# Patient Record
Sex: Female | Born: 1955 | Race: Black or African American | Hispanic: No | Smoking: Former smoker
Health system: Southern US, Community
[De-identification: ages and names within clinical notes are randomized; demographics above are authoritative.]

## PROBLEM LIST (undated history)

## (undated) DIAGNOSIS — J45909 Unspecified asthma, uncomplicated: Secondary | ICD-10-CM

## (undated) DIAGNOSIS — Z923 Personal history of irradiation: Secondary | ICD-10-CM

## (undated) DIAGNOSIS — K219 Gastro-esophageal reflux disease without esophagitis: Secondary | ICD-10-CM

## (undated) DIAGNOSIS — N390 Urinary tract infection, site not specified: Secondary | ICD-10-CM

## (undated) DIAGNOSIS — J302 Other seasonal allergic rhinitis: Secondary | ICD-10-CM

## (undated) DIAGNOSIS — K5190 Ulcerative colitis, unspecified, without complications: Secondary | ICD-10-CM

## (undated) DIAGNOSIS — C50912 Malignant neoplasm of unspecified site of left female breast: Secondary | ICD-10-CM

## (undated) DIAGNOSIS — Z1612 Extended spectrum beta lactamase (ESBL) resistance: Secondary | ICD-10-CM

## (undated) DIAGNOSIS — M2550 Pain in unspecified joint: Secondary | ICD-10-CM

## (undated) DIAGNOSIS — Z9289 Personal history of other medical treatment: Secondary | ICD-10-CM

## (undated) DIAGNOSIS — Z973 Presence of spectacles and contact lenses: Secondary | ICD-10-CM

## (undated) DIAGNOSIS — E669 Obesity, unspecified: Secondary | ICD-10-CM

## (undated) DIAGNOSIS — E559 Vitamin D deficiency, unspecified: Secondary | ICD-10-CM

## (undated) DIAGNOSIS — G8929 Other chronic pain: Secondary | ICD-10-CM

## (undated) DIAGNOSIS — Z87891 Personal history of nicotine dependence: Secondary | ICD-10-CM

## (undated) DIAGNOSIS — Z8249 Family history of ischemic heart disease and other diseases of the circulatory system: Secondary | ICD-10-CM

## (undated) DIAGNOSIS — R519 Headache, unspecified: Secondary | ICD-10-CM

## (undated) DIAGNOSIS — Z90710 Acquired absence of both cervix and uterus: Secondary | ICD-10-CM

## (undated) DIAGNOSIS — B9629 Other Escherichia coli [E. coli] as the cause of diseases classified elsewhere: Secondary | ICD-10-CM

## (undated) DIAGNOSIS — R51 Headache: Secondary | ICD-10-CM

## (undated) DIAGNOSIS — Z86010 Personal history of colonic polyps: Secondary | ICD-10-CM

## (undated) DIAGNOSIS — Q676 Pectus excavatum: Secondary | ICD-10-CM

## (undated) DIAGNOSIS — T7840XA Allergy, unspecified, initial encounter: Secondary | ICD-10-CM

## (undated) DIAGNOSIS — E611 Iron deficiency: Secondary | ICD-10-CM

## (undated) DIAGNOSIS — D649 Anemia, unspecified: Secondary | ICD-10-CM

## (undated) HISTORY — DX: Extended spectrum beta lactamase (ESBL) resistance: Z16.12

## (undated) HISTORY — DX: Personal history of other medical treatment: Z92.89

## (undated) HISTORY — DX: Pain in unspecified joint: M25.50

## (undated) HISTORY — DX: Family history of ischemic heart disease and other diseases of the circulatory system: Z82.49

## (undated) HISTORY — DX: Urinary tract infection, site not specified: N39.0

## (undated) HISTORY — DX: Headache, unspecified: R51.9

## (undated) HISTORY — DX: Ulcerative colitis, unspecified, without complications: K51.90

## (undated) HISTORY — DX: Other seasonal allergic rhinitis: J30.2

## (undated) HISTORY — PX: WISDOM TOOTH EXTRACTION: SHX21

## (undated) HISTORY — DX: Other Escherichia coli (E. coli) as the cause of diseases classified elsewhere: B96.29

## (undated) HISTORY — DX: Personal history of colonic polyps: Z86.010

## (undated) HISTORY — DX: Headache: R51

## (undated) HISTORY — PX: SKIN BIOPSY: SHX1

## (undated) HISTORY — PX: POLYPECTOMY: SHX149

## (undated) HISTORY — DX: Pectus excavatum: Q67.6

## (undated) HISTORY — DX: Acquired absence of both cervix and uterus: Z90.710

## (undated) HISTORY — DX: Presence of spectacles and contact lenses: Z97.3

## (undated) HISTORY — DX: Iron deficiency: E61.1

## (undated) HISTORY — DX: Obesity, unspecified: E66.9

## (undated) HISTORY — PX: TONSILLECTOMY: SUR1361

## (undated) HISTORY — DX: Personal history of nicotine dependence: Z87.891

## (undated) HISTORY — DX: Anemia, unspecified: D64.9

## (undated) HISTORY — DX: Malignant neoplasm of unspecified site of left female breast: C50.912

## (undated) HISTORY — DX: Vitamin D deficiency, unspecified: E55.9

## (undated) HISTORY — DX: Other chronic pain: G89.29

## (undated) HISTORY — DX: Gastro-esophageal reflux disease without esophagitis: K21.9

## (undated) HISTORY — DX: Allergy, unspecified, initial encounter: T78.40XA

## (undated) HISTORY — PX: OTHER SURGICAL HISTORY: SHX169

---

## 1997-09-24 ENCOUNTER — Ambulatory Visit (HOSPITAL_COMMUNITY): Admission: RE | Admit: 1997-09-24 | Discharge: 1997-09-24 | Payer: Self-pay | Admitting: Obstetrics and Gynecology

## 1997-11-23 ENCOUNTER — Other Ambulatory Visit: Admission: RE | Admit: 1997-11-23 | Discharge: 1997-11-23 | Payer: Self-pay | Admitting: Obstetrics and Gynecology

## 1999-10-22 ENCOUNTER — Other Ambulatory Visit: Admission: RE | Admit: 1999-10-22 | Discharge: 1999-10-22 | Payer: Self-pay | Admitting: Obstetrics and Gynecology

## 2000-06-15 HISTORY — PX: TOTAL ABDOMINAL HYSTERECTOMY: SHX209

## 2001-04-14 ENCOUNTER — Inpatient Hospital Stay (HOSPITAL_COMMUNITY): Admission: RE | Admit: 2001-04-14 | Discharge: 2001-04-17 | Payer: Self-pay | Admitting: Obstetrics and Gynecology

## 2003-08-03 ENCOUNTER — Other Ambulatory Visit: Admission: RE | Admit: 2003-08-03 | Discharge: 2003-08-03 | Payer: Self-pay | Admitting: Obstetrics and Gynecology

## 2005-01-09 ENCOUNTER — Other Ambulatory Visit: Admission: RE | Admit: 2005-01-09 | Discharge: 2005-01-09 | Payer: Self-pay | Admitting: Obstetrics and Gynecology

## 2007-06-16 DIAGNOSIS — Z9289 Personal history of other medical treatment: Secondary | ICD-10-CM

## 2007-06-16 HISTORY — DX: Personal history of other medical treatment: Z92.89

## 2011-02-20 ENCOUNTER — Other Ambulatory Visit: Payer: Self-pay | Admitting: Podiatry

## 2011-02-20 DIAGNOSIS — M25572 Pain in left ankle and joints of left foot: Secondary | ICD-10-CM

## 2011-02-24 ENCOUNTER — Other Ambulatory Visit: Payer: Self-pay

## 2011-03-04 ENCOUNTER — Ambulatory Visit
Admission: RE | Admit: 2011-03-04 | Discharge: 2011-03-04 | Disposition: A | Discharge status: Home / Self Care | Payer: 59 | Source: Ambulatory Visit | Attending: Podiatry | Admitting: Podiatry

## 2011-03-04 DIAGNOSIS — M25572 Pain in left ankle and joints of left foot: Secondary | ICD-10-CM

## 2011-06-16 DIAGNOSIS — N390 Urinary tract infection, site not specified: Secondary | ICD-10-CM

## 2011-06-16 HISTORY — PX: FOOT TENDON SURGERY: SHX958

## 2011-06-16 HISTORY — DX: Urinary tract infection, site not specified: N39.0

## 2012-11-21 ENCOUNTER — Emergency Department (INDEPENDENT_AMBULATORY_CARE_PROVIDER_SITE_OTHER)
Admission: EM | Admit: 2012-11-21 | Discharge: 2012-11-21 | Disposition: A | Discharge status: Home / Self Care | Payer: 59 | Source: Home / Self Care | Attending: Emergency Medicine | Admitting: Emergency Medicine

## 2012-11-21 ENCOUNTER — Encounter (HOSPITAL_COMMUNITY): Payer: Self-pay | Admitting: Emergency Medicine

## 2012-11-21 DIAGNOSIS — N390 Urinary tract infection, site not specified: Secondary | ICD-10-CM

## 2012-11-21 HISTORY — DX: Unspecified asthma, uncomplicated: J45.909

## 2012-11-21 LAB — POCT URINALYSIS DIP (DEVICE)
Glucose, UA: NEGATIVE mg/dL
Ketones, ur: NEGATIVE mg/dL
Protein, ur: 100 mg/dL — AB
Specific Gravity, Urine: >=1.03 (ref 1.005–1.030)
Urobilinogen, UA: 0.2 mg/dL (ref 0.0–1.0)

## 2012-11-21 MED ORDER — PHENAZOPYRIDINE HCL 200 MG PO TABS: 200.0000 mg | ORAL_TABLET | Freq: Three times a day (TID) | ORAL | Status: DC

## 2012-11-21 MED ORDER — SULFAMETHOXAZOLE-TRIMETHOPRIM 800-160 MG PO TABS: 1.0000 | ORAL_TABLET | Freq: Two times a day (BID) | ORAL | Status: DC

## 2012-11-21 NOTE — ED Provider Notes (Signed)
Medical screening examination/treatment/procedure(s) were performed by non-physician practitioner and as supervising physician I was immediately available for consultation/collaboration.  Burnett Kanaris, MD 11/21/12 [phone]

## 2012-11-21 NOTE — ED Notes (Signed)
Pt c/o poss UTI onset Wednesday sxs include: urgency, frequency, dysuria Denies: abd pain, fevers, vag d/c Drinking Cranberry Juice for sxs w/no relief. She is alert and oriented w/no signs of acute distress.

## 2012-11-21 NOTE — ED Provider Notes (Signed)
History     CSN: [id number]  Arrival date & time 11/21/12  1010   First MD Initiated Contact with Patient 11/21/12 1047      Chief Complaint  Patient presents with  . Urinary Tract Infection    (Consider location/radiation/quality/duration/timing/severity/associated sxs/prior treatment) HPI Comments: Pt presents c.o urinary frequency, urgency, and burning at the end of her stream since Wednesday 5 days ago.  She has been trying to drink cranberry juice for this but it keeps getting worse.  She has had some mild bilateral flank pain as well.  Denies abdominal pain, fever, chills, NVD, or gross hematuria.  No recent hospitalizations or urinary catheters   Patient is a 57 y.o. female presenting with urinary tract infection.  Urinary Tract Infection Pertinent negatives include no chest pain, no abdominal pain and no shortness of breath.    Past Medical History  Diagnosis Date  . Asthma     History reviewed. No pertinent past surgical history.  No family history on file.  History  Substance Use Topics  . Smoking status: Never Smoker   . Smokeless tobacco: Not on file  . Alcohol Use: No    OB History   Grav Para Term Preterm Abortions TAB SAB Ect Mult Living                  Review of Systems  Constitutional: Negative for fever and chills.  Eyes: Negative for visual disturbance.  Respiratory: Negative for cough and shortness of breath.   Cardiovascular: Negative for chest pain, palpitations and leg swelling.  Gastrointestinal: Negative for nausea, vomiting and abdominal pain.  Endocrine: Negative for polydipsia and polyuria.  Genitourinary: Positive for dysuria, urgency, frequency and flank pain. Negative for hematuria, vaginal bleeding, vaginal discharge, difficulty urinating and pelvic pain.  Musculoskeletal: Negative for myalgias and arthralgias.  Skin: Negative for rash.  Neurological: Negative for dizziness, weakness and light-headedness.    Allergies  Review  of patient's allergies indicates no known allergies.  Home Medications   Current Outpatient Rx  Name  Route  Sig  Dispense  Refill  . phenazopyridine (PYRIDIUM) 200 MG tablet   Oral   Take 1 tablet (200 mg total) by mouth 3 (three) times daily.   6 tablet   0   . sulfamethoxazole-trimethoprim (SEPTRA DS) 800-160 MG per tablet   Oral   Take 1 tablet by mouth every 12 (twelve) hours.   10 tablet   0     BP 140/99  Pulse 87  Temp(Src) 98.2 F (36.8 C) (Oral)  Resp 18  SpO2 99%  Physical Exam  Nursing note and vitals reviewed. Constitutional: She is oriented to person, place, and time. Vital signs are normal. She appears well-developed and well-nourished. No distress.  HENT:  Head: Atraumatic.  Eyes: EOM are normal. Pupils are equal, round, and reactive to light.  Cardiovascular: Normal rate, regular rhythm and normal heart sounds.  Exam reveals no gallop and no friction rub.   No murmur heard. Pulmonary/Chest: Effort normal and breath sounds normal. No respiratory distress. She has no wheezes. She has no rales.  Abdominal: Soft. There is no tenderness. There is no CVA tenderness.  Neurological: She is alert and oriented to person, place, and time. She has normal strength.  Skin: Skin is warm and dry. She is not diaphoretic.  Psychiatric: She has a normal mood and affect. Her behavior is normal. Judgment normal.    ED Course  Procedures (including critical care time)  Labs Reviewed  POCT URINALYSIS DIP (DEVICE) - Abnormal; Notable for the following:    Hgb urine dipstick MODERATE (*)    Protein, ur 100 (*)    Nitrite POSITIVE (*)    Leukocytes, UA MODERATE (*)    All other components within normal limits  URINE CULTURE   No results found.   1. UTI (lower urinary tract infection)       MDM  Will treat as simple UTI.  Pt will f/u if she develops any fever, worsening flank pain, NVD, abdominal pain.    Meds ordered this encounter  Medications  .  sulfamethoxazole-trimethoprim (SEPTRA DS) 800-160 MG per tablet    Sig: Take 1 tablet by mouth every 12 (twelve) hours.    Dispense:  10 tablet    Refill:  0  . phenazopyridine (PYRIDIUM) 200 MG tablet    Sig: Take 1 tablet (200 mg total) by mouth 3 (three) times daily.    Dispense:  6 tablet    Refill:  0           Liam Graham, PA-C 11/21/12 1125

## 2012-11-23 ENCOUNTER — Telehealth (HOSPITAL_COMMUNITY): Payer: Self-pay | Admitting: *Deleted

## 2012-11-23 LAB — URINE CULTURE

## 2012-11-23 MED ORDER — NITROFURANTOIN MONOHYD MACRO 100 MG PO CAPS: 100.0000 mg | ORAL_CAPSULE | Freq: Two times a day (BID) | ORAL | Status: DC

## 2012-11-23 NOTE — ED Notes (Signed)
Urine culture: >100,000 colonies E. Coli.  Pt. treated with Septra DS and it is resistant.  Order obtained from [facility] for [employer].  I called pt.  Pt. verified x 2 and given results.  Pt. told to stop the Septra and take all of Macrobid with plenty of water. Pt. wants Rx. called to [facility] on [address].  Rx. called to pharmacist @ [phone].

## 2013-04-14 ENCOUNTER — Encounter (HOSPITAL_COMMUNITY): Payer: Self-pay | Admitting: Emergency Medicine

## 2013-04-14 ENCOUNTER — Emergency Department (HOSPITAL_COMMUNITY)
Admission: EM | Admit: 2013-04-14 | Discharge: 2013-04-14 | Disposition: A | Discharge status: Home / Self Care | Payer: 59 | Source: Home / Self Care | Attending: Family Medicine | Admitting: Family Medicine

## 2013-04-14 DIAGNOSIS — N390 Urinary tract infection, site not specified: Secondary | ICD-10-CM

## 2013-04-14 LAB — POCT URINALYSIS DIP (DEVICE)
Nitrite: NEGATIVE
Specific Gravity, Urine: >=1.03 (ref 1.005–1.030)
Urobilinogen, UA: 0.2 mg/dL (ref 0.0–1.0)
pH: 5.5 (ref 5.0–8.0)

## 2013-04-14 MED ORDER — CEPHALEXIN 500 MG PO CAPS: 500.0000 mg | ORAL_CAPSULE | Freq: Four times a day (QID) | ORAL | Status: DC

## 2013-04-14 NOTE — ED Provider Notes (Signed)
CSN: [id number]     Arrival date & time 04/14/13  1312 History   First MD Initiated Contact with Patient 04/14/13 1400     Chief Complaint  Patient presents with  . Urinary Tract Infection   (Consider location/radiation/quality/duration/timing/severity/associated sxs/prior Treatment) Patient is a 57 y.o. female presenting with urinary tract infection. The history is provided by the patient.  Urinary Tract Infection This is a new problem. The current episode started more than 2 days ago. The problem has been gradually worsening. Pertinent negatives include no abdominal pain and no shortness of breath.    Past Medical History  Diagnosis Date  . Asthma    History reviewed. No pertinent past surgical history. History reviewed. No pertinent family history. History  Substance Use Topics  . Smoking status: Never Smoker   . Smokeless tobacco: Not on file  . Alcohol Use: No   OB History   Grav Para Term Preterm Abortions TAB SAB Ect Mult Living                 Review of Systems  Constitutional: Negative.   Respiratory: Negative for shortness of breath.   Gastrointestinal: Negative.  Negative for abdominal pain.  Genitourinary: Positive for dysuria, urgency and frequency.    Allergies  Review of patient's allergies indicates no known allergies.  Home Medications   Current Outpatient Rx  Name  Route  Sig  Dispense  Refill  . cephALEXin (KEFLEX) 500 MG capsule   Oral   Take 1 capsule (500 mg total) by mouth 4 (four) times daily. Take all of medicine and drink lots of fluids   20 capsule   0   . nitrofurantoin, macrocrystal-monohydrate, (MACROBID) 100 MG capsule   Oral   Take 1 capsule (100 mg total) by mouth 2 (two) times daily.   14 capsule   0   . phenazopyridine (PYRIDIUM) 200 MG tablet   Oral   Take 1 tablet (200 mg total) by mouth 3 (three) times daily.   6 tablet   0   . sulfamethoxazole-trimethoprim (SEPTRA DS) 800-160 MG per tablet   Oral   Take 1 tablet  by mouth every 12 (twelve) hours.   10 tablet   0    BP 146/92  Pulse 88  Temp(Src) 97.4 F (36.3 C) (Oral)  Resp 16  SpO2 98% Physical Exam  Nursing note and vitals reviewed. Constitutional: She is oriented to person, place, and time. She appears well-developed and well-nourished.  Abdominal: Soft. Bowel sounds are normal. She exhibits no distension and no mass. There is no tenderness. There is no rebound and no guarding.  Lymphadenopathy:    She has no cervical adenopathy.  Neurological: She is alert and oriented to person, place, and time.  Skin: Skin is warm and dry.    ED Course  Procedures (including critical care time) Labs Review Labs Reviewed  POCT URINALYSIS DIP (DEVICE) - Abnormal; Notable for the following:    Hgb urine dipstick LARGE (*)    Protein, ur 100 (*)    Leukocytes, UA LARGE (*)    All other components within normal limits   Imaging Review No results found.    MDM  U/a abnl    Billy Fischer, MD 04/14/13 [phone]

## 2013-04-14 NOTE — ED Notes (Signed)
C/o UTI since Sunday.  States she is urinating frequently with pressure and pain at the end of the stream.

## 2013-05-30 ENCOUNTER — Ambulatory Visit (INDEPENDENT_AMBULATORY_CARE_PROVIDER_SITE_OTHER): Payer: 59 | Admitting: Medical

## 2013-05-30 ENCOUNTER — Encounter: Payer: Self-pay | Admitting: Medical

## 2013-05-30 VITALS — BP 140/90 | HR 83 | Temp 98.2°F | Resp 16 | Ht 71.0 in | Wt 238.0 lb

## 2013-05-30 DIAGNOSIS — Z0000 Encounter for general adult medical examination without abnormal findings: Secondary | ICD-10-CM

## 2013-05-30 DIAGNOSIS — R51 Headache: Secondary | ICD-10-CM

## 2013-05-30 DIAGNOSIS — J069 Acute upper respiratory infection, unspecified: Secondary | ICD-10-CM

## 2013-05-30 DIAGNOSIS — R030 Elevated blood-pressure reading, without diagnosis of hypertension: Secondary | ICD-10-CM

## 2013-05-30 DIAGNOSIS — J45909 Unspecified asthma, uncomplicated: Secondary | ICD-10-CM

## 2013-05-30 DIAGNOSIS — Z1211 Encounter for screening for malignant neoplasm of colon: Secondary | ICD-10-CM

## 2013-05-30 DIAGNOSIS — Z23 Encounter for immunization: Secondary | ICD-10-CM

## 2013-05-30 DIAGNOSIS — Z1239 Encounter for other screening for malignant neoplasm of breast: Secondary | ICD-10-CM

## 2013-05-30 DIAGNOSIS — G8929 Other chronic pain: Secondary | ICD-10-CM

## 2013-05-30 DIAGNOSIS — E669 Obesity, unspecified: Secondary | ICD-10-CM

## 2013-05-30 LAB — CBC WITH DIFFERENTIAL/PLATELET
Eosinophils Absolute: 0.6 10*3/uL (ref 0.0–0.7)
Hemoglobin: 14.2 g/dL (ref 12.0–15.0)
Lymphocytes Relative: 41 % (ref 12–46)
Lymphs Abs: 3.8 10*3/uL (ref 0.7–4.0)
Monocytes Relative: 7 % (ref 3–12)
Neutro Abs: 4.1 10*3/uL (ref 1.7–7.7)
Neutrophils Relative %: 44 % (ref 43–77)
Platelets: 318 10*3/uL (ref 150–400)
RBC: 5.13 MIL/uL — ABNORMAL HIGH (ref 3.87–5.11)
WBC: 9.2 10*3/uL (ref 4.0–10.5)

## 2013-05-30 LAB — POCT URINALYSIS DIPSTICK
Blood, UA: NEGATIVE
Glucose, UA: NEGATIVE
Ketones, UA: NEGATIVE
Spec Grav, UA: 1.015
Urobilinogen, UA: NEGATIVE

## 2013-05-30 LAB — COMPREHENSIVE METABOLIC PANEL
Albumin: 4.2 g/dL (ref 3.5–5.2)
Alkaline Phosphatase: 126 U/L — ABNORMAL HIGH (ref 39–117)
BUN: 12 mg/dL (ref 6–23)
CO2: 24 mEq/L (ref 19–32)
Calcium: 10.6 mg/dL — ABNORMAL HIGH (ref 8.4–10.5)
Glucose, Bld: 87 mg/dL (ref 70–99)
Potassium: 3.9 mEq/L (ref 3.5–5.3)
Sodium: 143 mEq/L (ref 135–145)
Total Protein: 6.8 g/dL (ref 6.0–8.3)

## 2013-05-30 LAB — LIPID PANEL
Cholesterol: 210 mg/dL — ABNORMAL HIGH (ref 0–200)
HDL: 88 mg/dL (ref >39)
Total CHOL/HDL Ratio: 2.4 Ratio
Triglycerides: 99 mg/dL (ref <150)
VLDL: 20 mg/dL (ref 0–40)

## 2013-05-30 MED ORDER — ALBUTEROL SULFATE HFA 108 (90 BASE) MCG/ACT IN AERS: 2.0000 | INHALATION_SPRAY | Freq: Four times a day (QID) | RESPIRATORY_TRACT | Status: DC | PRN

## 2013-05-30 NOTE — Progress Notes (Signed)
Subjective:   HPI  Katie Robinson is a 57 y.o. female who presents for a complete physical.   She is a new patient today. She has not had any recent primary care.  Preventative care: Last ophthalmology visit:yes Dr. Alois Cliche Last dental visit:n/a Last colonoscopy:never Last mammogram:2008 Last gynecological exam:2008 Last EKGyears ago Last labs:?  Prior vaccinations: TD or Tdap:? Influenza:no flu vaccine Pneumococcal:n/a Shingles/Zostavax:n/a  Advanced directive:n/a Health care power of attorney:n/a Living will:n/a  Concerns: Asthma - feels a little tight of late.   No recent inhaler use.  Gets flare up with illness 1-2 times yearly.  URI - the past week with cough, congestion, clearing throat, post nasal drip.  No fever, no NVD, no sinus pressure.  chronic headaches -  Long history of headaches since teenage years, gets on average one a week now  S/p hysterectomy for chocolate cyst nad fibroids, no hx/o cancer or abnormal pap.  Has had several normal paps since, Dr. Matthew Saras.    Reviewed their medical, surgical, family, social, medication, and allergy history and updated chart as appropriate.  Past Medical History  Diagnosis Date  . Asthma   . Seasonal allergic rhinitis   . Obesity   . Urinary tract infection Aug 31, 2011  . Chronic headache     many years  . Arthralgia   . Wears glasses   . S/P hysterectomy     due to fibroids  . Family history of premature coronary artery disease     father  . Former smoker     10 pack year history    Past Surgical History  Procedure Laterality Date  . Abdominal hysterectomy  Aug 30, 2000    total; Dr. Molli Posey  . Foot tendon surgery  2011-08-31    left  . Tonsillectomy    . Wisdom tooth extraction    . Colonoscopy      pending 05/2013    History   Social History  . Marital Status: Divorced    Spouse Name: N/A    Number of Children: N/A  . Years of Education: N/A   Occupational History  . Not on file.   Social History  Main Topics  . Smoking status: Never Smoker   . Smokeless tobacco: Not on file  . Alcohol Use: No  . Drug Use: No  . Sexual Activity: Not on file   Other Topics Concern  . Not on file   Social History Narrative   Lives with her 2 adult children, not married, no exercise, active with walking on the job at the [employer]    Family History  Problem Relation Age of Onset  . Asthma Father   . Heart disease Father 82    died of MI mid 08/30/2022  . Hypertension Sister   . Asthma Sister   . Hypertension Mother   . Aneurysm Mother     brain  . Alzheimer's disease Mother   . Cancer Neg Hx   . Stroke Neg Hx   . Diabetes Neg Hx     Current outpatient prescriptions:albuterol (PROVENTIL HFA;VENTOLIN HFA) 108 (90 BASE) MCG/ACT inhaler, Inhale 2 puffs into the lungs every 6 (six) hours as needed for wheezing or shortness of breath., Disp: 1 Inhaler, Rfl: 0  No Known Allergies     Review of Systems Constitutional: -fever, -chills, -sweats, -unexpected weight change, -decreased appetite, -fatigue Allergy: -sneezing, -itching, -congestion Dermatology: +changing moles, --rash, -lumps ENT: -runny nose, -ear pain, -sore throat, -hoarseness, -sinus pain, -teeth pain, -  ringing in ears, -hearing loss, -nosebleeds Cardiology: -chest pain, -palpitations, -swelling, -difficulty breathing when lying flat, -waking up short of breath Respiratory: -cough, -shortness of breath, -difficulty breathing with exercise or exertion, -wheezing, -coughing up blood Gastroenterology: -abdominal pain, -nausea, -vomiting, -diarrhea, -constipation, -blood in stool, -changes in bowel movement, -difficulty swallowing or eating Hematology: -bleeding, -bruising  Musculoskeletal: +joint aches, -muscle aches, -joint swelling, -back pain, -neck pain, -cramping, -changes in gait Ophthalmology: denies vision changes, eye redness, itching, discharge Urology: -burning with urination, -difficulty urinating, -blood in urine,  -urinary frequency, -urgency, -incontinence Neurology: +headache, -weakness, -tingling, -numbness, -memory loss, -falls, -dizziness Psychology: -depressed mood, -agitation, -sleep problems     Objective:   Physical Exam  Filed Vitals:   05/30/13 1545  BP: 140/90  Pulse: 83  Temp: 98.2 F (36.8 C)  Resp: 16    General appearance: alert, no distress, WD/WN, pleasant, AA female Skin: few scattered macules , no worrisome lesions HEENT: normocephalic, conjunctiva/corneas normal, sclerae anicteric, PERRLA, EOMi, nares with turbinates edema, clear discharge , slight erythema, pharynx with mild erythema  Oral cavity: MMM, tongue normal, teeth- left upper molar with decay, otherwise teeth in good repair, post nasal drainage present Neck: supple, no lymphadenopathy, no thyromegaly, no masses, normal ROM Chest: There is slight pectus excavatum depression on the left upper chest, unchanged per pt for years, otherwise non tender, normal expansion Heart: RRR, normal S1, S2, no murmurs Lungs: few slight wheezes, but no rhonchi, or rales Abdomen: +bs, soft, non tender, non distended, no masses, no hepatomegaly, no splenomegaly, no bruits Back: non tender, normal ROM, no scoliosis Musculoskeletal: left medial foot/ankle with vertical surgical scar, otherwise upper extremities non tender, no obvious deformity, normal ROM throughout, lower extremities non tender, no obvious deformity, normal ROM throughout Extremities: no edema, no cyanosis, no clubbing Pulses: 2+ symmetric, upper and lower extremities, normal cap refill Neurological: alert, oriented x 3, CN2-12 intact, strength normal upper extremities and lower extremities, sensation normal throughout, DTRs 2+ throughout, no cerebellar signs, gait normal Psychiatric: normal affect, behavior normal, pleasant  Breast: nontender, few small nodular areas under areola bilat, but no worrisome masses or lumps, no skin changes, no nipple discharge or  inversion, no axillary lymphadenopathy Gyn: Normal external genitalia without lesions, vagina with normal mucosa, s/p hysterectomy, no abnormal vaginal discharge. Nontender, no masses.   Exam chaperoned by nurse. Rectal: deferred to GI     Assessment and Plan :    Encounter Diagnoses  Name Primary?  . Routine general medical examination at a health care facility Yes  . Need for Tdap vaccination   . Need for prophylactic vaccination against Streptococcus pneumoniae (pneumococcus)   . Need for prophylactic vaccination and inoculation against influenza   . Obesity, unspecified   . Screening for breast cancer   . Special screening for malignant neoplasms, colon   . Elevated blood pressure reading without diagnosis of hypertension   . Upper respiratory infection   . Unspecified asthma(493.90)   . Chronic headache     Physical exam - discussed healthy lifestyle, diet, exercise, preventative care, vaccinations, and addressed their concerns.  Handout given.  See dentist soon for left upper molar decay.  Request prior cardiac eval recors.  Counseled on the Tdap (tetanus, diptheria, and acellular pertussis) vaccine.  Vaccine information sheet given. Tdap vaccine given after consent obtained. Counseled on the pneumococcal vaccine.  Vaccine information sheet given.  Pneumococcal vaccine given after consent obtained. Counseled on the influenza virus vaccine.  Vaccine information sheet given.  Influenza  vaccine given after consent obtained. Obesity - discussed the need to lose weight through a healthy diet and regular exercise Referrals today for screening mammogram, first screening colonoscopy Elevated blood pressure without hypertension today-monitor URI-discussed supportive care, call if worsening by the end of the week Asthma-refilled her inhaler today for current wheezing in the chest, recheck in 2-3 weeks Chronic headache-recheck in 2-3 weeks to discuss further Follow-up pending  studies

## 2013-05-31 NOTE — Addendum Note (Signed)
Addended by: Armanda Magic on: 05/31/2013 08:18 AM   Modules accepted: Orders

## 2013-06-02 ENCOUNTER — Ambulatory Visit (INDEPENDENT_AMBULATORY_CARE_PROVIDER_SITE_OTHER): Payer: 59 | Admitting: Medical

## 2013-06-02 ENCOUNTER — Ambulatory Visit
Admission: RE | Admit: 2013-06-02 | Discharge: 2013-06-02 | Disposition: A | Discharge status: Home / Self Care | Payer: 59 | Source: Ambulatory Visit | Attending: Medical | Admitting: Medical

## 2013-06-02 VITALS — BP 158/100 | HR 102

## 2013-06-02 DIAGNOSIS — Z87891 Personal history of nicotine dependence: Secondary | ICD-10-CM

## 2013-06-02 DIAGNOSIS — E559 Vitamin D deficiency, unspecified: Secondary | ICD-10-CM

## 2013-06-02 DIAGNOSIS — R809 Proteinuria, unspecified: Secondary | ICD-10-CM

## 2013-06-02 DIAGNOSIS — J45909 Unspecified asthma, uncomplicated: Secondary | ICD-10-CM

## 2013-06-02 LAB — POCT URINALYSIS DIPSTICK
Bilirubin, UA: NEGATIVE
Glucose, UA: NEGATIVE
Spec Grav, UA: <=1.005
Urobilinogen, UA: 0.2

## 2013-06-02 MED ORDER — SODIUM CHLORIDE 0.9 % IV SOLN
125.0000 mg | Freq: Every day | INTRAVENOUS | Status: DC
  Administered 2013-06-02: 130 mg via INTRAMUSCULAR

## 2013-06-02 NOTE — Patient Instructions (Signed)
Asthma flare - continue albuterol inhaler, 2 puffs every 4-6 hours as needed. We gave you a shot of Solu-Medrol steroid today to calm down inflammation and to help her asthma flare.  If over the weekend you are not improving, then begin Symbicort inhaler, 2 puffs twice daily. This is a controller medicine, not a rescue or emergency inhaler.  Begin over-the-counter antihistamine at bedtime daily for the next several weeks, such as Benadryl, Zyrtec, or Allegra.  If not much improved before Christmas then call back.  Begin prescription ergocalciferol/vitamin D weekly for 3 months then return for recheck.  We will call with the additional blood work.  Andria Frames will call next week with the information for mammogram and colonoscopy referrals.

## 2013-06-02 NOTE — Progress Notes (Signed)
Subjective: I saw her recently as a new patient for a physical.  She is here to discuss labs.  Asthma-started using her albuterol inhaler since last visit, has seen some improvement.  Been having post nasal drip the week before thanksgiving about 3 wk ago.  Since then has been having chest tightness, tightness, cough spells.  Works at night.  Lately had awoken twice with barking cough, tight in chest.  Current feels nasal congestion.    Vitamin D deficiency-noted on recent labs.  No prior screening for this.  Does eat 3-4 dairy servings daily.  Not currently taking Ca+D supplement.  No hx/o osteoporosis in family.    Elevated calcium on labs.  No prior, no c/o.    Mildly elevated total cholesterol-but HDL good, LDL good, and she notes overall healthy diet.  Has been checked in the past and HDL always good.  Past Medical History  Diagnosis Date  . Asthma   . Seasonal allergic rhinitis   . Obesity   . Urinary tract infection 2013  . Chronic headache     many years  . Arthralgia   . Wears glasses   . S/P hysterectomy     due to fibroids  . Family history of premature coronary artery disease     father  . Former smoker     10 pack year history  . Pectus excavatum     mild, left asymmetric  . H/O cardiovascular stress test 2009   ROS as in subjective  Objective: Gen:wd, wn, nad Skin warm, dry HEENT- Serous effusions bilaterally, nares patent, pharynx normal, nontender sinus Lungs: Decreased breath sounds, scattered wheezes, few rhonchi Heart: RRR, normal S1, S2, no murmurs Extremities no edema    Assessment: Encounter Diagnoses  Name Primary?  Marland Kitchen Unspecified asthma(493.90) Yes  . Hypercalcemia   . Proteinuria   . Unspecified vitamin D deficiency   . Former smoker    Plan: Asthma - her lungs are worse today than the other day, 125 mg Solu-Medrol IM given today, continue albuterol inhaler, begin over-the-counter antihistamine, and if not improving in the next 2 days, then  begin the sample Symbicort. We discussed the difference between rescue and controller inhalers Hypercalcemia-we will check some additional labs today as well as chest x-ray Proteinuria, microscopic per recent urine at her physical-urine without protein today Vitamin D deficiency-she will begin ergocalciferol weekly for 3 months then recheck Former smoker-in light of her recent wheezing and elevated calcium, chest x-ray today Followup pending studies.

## 2013-06-03 LAB — T4, FREE: Free T4: 1.04 ng/dL (ref 0.80–1.80)

## 2013-06-03 LAB — TSH: TSH: 0.463 u[IU]/mL (ref 0.350–4.500)

## 2013-06-05 LAB — PTH, INTACT AND CALCIUM: PTH: 134.5 pg/mL — ABNORMAL HIGH (ref 14.0–72.0)

## 2013-06-13 ENCOUNTER — Other Ambulatory Visit: Payer: Self-pay | Admitting: Family Medicine

## 2013-06-13 DIAGNOSIS — R222 Localized swelling, mass and lump, trunk: Secondary | ICD-10-CM

## 2013-06-15 DIAGNOSIS — K5190 Ulcerative colitis, unspecified, without complications: Secondary | ICD-10-CM

## 2013-06-15 HISTORY — DX: Ulcerative colitis, unspecified, without complications: K51.90

## 2013-06-21 ENCOUNTER — Other Ambulatory Visit: Payer: Self-pay | Admitting: Family Medicine

## 2013-06-21 ENCOUNTER — Telehealth: Payer: Self-pay | Admitting: Family Medicine

## 2013-06-21 ENCOUNTER — Ambulatory Visit
Admission: RE | Admit: 2013-06-21 | Discharge: 2013-06-21 | Disposition: A | Discharge status: Home / Self Care | Payer: 59 | Source: Ambulatory Visit | Attending: Medical | Admitting: Medical

## 2013-06-21 DIAGNOSIS — R222 Localized swelling, mass and lump, trunk: Secondary | ICD-10-CM

## 2013-06-21 DIAGNOSIS — Z1239 Encounter for other screening for malignant neoplasm of breast: Secondary | ICD-10-CM

## 2013-06-21 DIAGNOSIS — Z1211 Encounter for screening for malignant neoplasm of colon: Secondary | ICD-10-CM

## 2013-06-21 MED ORDER — IOHEXOL 300 MG/ML  SOLN
75.0000 mL | Freq: Once | INTRAMUSCULAR | Status: AC | PRN
  Administered 2013-06-21: 75 mL via INTRAVENOUS

## 2013-06-21 NOTE — Telephone Encounter (Signed)
Open chart by accident i was trying to place future orders. CLS

## 2013-07-11 ENCOUNTER — Other Ambulatory Visit: Payer: Self-pay

## 2013-07-11 DIAGNOSIS — Z1231 Encounter for screening mammogram for malignant neoplasm of breast: Secondary | ICD-10-CM

## 2013-07-24 ENCOUNTER — Ambulatory Visit
Admission: RE | Admit: 2013-07-24 | Discharge: 2013-07-24 | Disposition: A | Discharge status: Home / Self Care | Payer: 59 | Source: Ambulatory Visit

## 2013-07-24 DIAGNOSIS — Z1231 Encounter for screening mammogram for malignant neoplasm of breast: Secondary | ICD-10-CM

## 2013-08-21 ENCOUNTER — Encounter: Payer: Self-pay | Admitting: Medical

## 2013-10-12 ENCOUNTER — Encounter: Payer: Self-pay | Admitting: Medical

## 2013-10-12 ENCOUNTER — Ambulatory Visit (INDEPENDENT_AMBULATORY_CARE_PROVIDER_SITE_OTHER): Payer: 59 | Admitting: Medical

## 2013-10-12 VITALS — BP 124/88 | HR 88 | Temp 98.3°F | Resp 18 | Wt 235.0 lb

## 2013-10-12 DIAGNOSIS — J45901 Unspecified asthma with (acute) exacerbation: Secondary | ICD-10-CM

## 2013-10-12 DIAGNOSIS — J069 Acute upper respiratory infection, unspecified: Secondary | ICD-10-CM

## 2013-10-12 MED ORDER — METHYLPREDNISOLONE SODIUM SUCC 125 MG IJ SOLR
125.0000 mg | Freq: Once | INTRAMUSCULAR | Status: AC
  Administered 2013-10-12: 125 mg via INTRAMUSCULAR

## 2013-10-12 MED ORDER — BUDESONIDE-FORMOTEROL FUMARATE 160-4.5 MCG/ACT IN AERO: 2.0000 | INHALATION_SPRAY | Freq: Two times a day (BID) | RESPIRATORY_TRACT | Status: DC

## 2013-10-12 MED ORDER — ALBUTEROL SULFATE HFA 108 (90 BASE) MCG/ACT IN AERS: 2.0000 | INHALATION_SPRAY | Freq: Four times a day (QID) | RESPIRATORY_TRACT | Status: DC | PRN

## 2013-10-12 MED ORDER — ALBUTEROL SULFATE (2.5 MG/3ML) 0.083% IN NEBU: 2.5000 mg | INHALATION_SOLUTION | Freq: Four times a day (QID) | RESPIRATORY_TRACT | Status: DC | PRN

## 2013-10-12 NOTE — Progress Notes (Signed)
Subjective: Been feeling bad early in the week.  Sore and scratchy throat early in the week, but now chest tightness, wheezing.  Has cough, mild production, sinus headache, some left ear pressure.  Has head congestion.  Denies fever, NV.  Had loose stool on Monday only 4 days ago.  +sick contacts.  Using OTC cough/cold medication.  Currently not on preventative inhaler.  Has done Flovent once in the past.  No other aggravating or relieving factors.   Past Medical History  Diagnosis Date  . Asthma   . Seasonal allergic rhinitis   . Obesity   . Urinary tract infection 2013  . Chronic headache     many years  . Arthralgia   . Wears glasses   . S/P hysterectomy     due to fibroids  . Family history of premature coronary artery disease     father  . Former smoker     10 pack year history  . Pectus excavatum     mild, left asymmetric  . H/O cardiovascular stress test 2009   ROS as in subjective   Objective: Filed Vitals:   10/12/13 0810  BP: 124/88  Pulse: 88  Temp: 98.3 F (36.8 C)  Resp: 18    General appearance: alert, no distress, WD/WN HEENT: normocephalic, sclerae anicteric, TMs with serous effusion bilat, nares with turbinated edema, clear discharge , pharynx with post nasal drainage, mild erythema Oral cavity: MMM, no lesions Neck: supple, no lymphadenopathy, no thyromegaly, no masses Heart: RRR, normal S1, S2, no murmurs Lungs: diffuse wheezes, some rhonchi, no rales Pulses: 2+ symmetric, upper and lower extremities, normal cap refill Ext: no edema  Assessment:  Encounter Diagnoses  Name Primary?  Marland Kitchen Asthma with acute exacerbation Yes  . Viral upper respiratory illness    Plan: Pulse ox 97% room air.  1 round of Albuterol nebulized treatment given.   We discussed her moderate persistent asthma, and advised the need to be on controller medication.  Gave 161m SoluMedrol IM in office.  She has the sample of Symbicort I gave her last time . Advised she begin  Symbicort 2 puffs BID, albuterol prn this every 4-6 hours, and if not improving in 2-3 days let me know.  Otherwise c/t Symbicort for prevention as discussed 2 puffs BID, albuterol prn, and recheck in 153mon asthma control.  Call/return sooner prn.

## 2013-10-12 NOTE — Addendum Note (Signed)
Addended by: Louie Bun on: 10/12/2013 10:06 AM   Modules accepted: Orders

## 2013-10-12 NOTE — Patient Instructions (Signed)
  Thank you for giving me the opportunity to serve you today.    Your diagnosis today includes: Encounter Diagnoses  Name Primary?  Marland Kitchen Asthma with acute exacerbation Yes  . Viral upper respiratory illness      Specific recommendations today include:  Begin Symbicort 2 puffs twice daily for prevention of asthma flares.  Rinse mouth out with water after each use.  Begin Albuterol either hand held inhaler or nebulized treatment every 4-6 hours as needed this week, then as needed  You can continue OTC Dayquil/Nyquil for cough/congestion  Hydrate well with water, rest.  If not much improved in 2-3 days, then let me know  Recheck in 1 month on asthma control.  Return in about 1 month (around 11/11/2013), or recheck on asthma control.

## 2014-03-26 ENCOUNTER — Encounter: Payer: Self-pay | Admitting: Internal Medicine

## 2014-04-13 ENCOUNTER — Encounter: Payer: Self-pay | Admitting: Medical

## 2014-04-13 ENCOUNTER — Ambulatory Visit (INDEPENDENT_AMBULATORY_CARE_PROVIDER_SITE_OTHER): Payer: 59 | Admitting: Medical

## 2014-04-13 VITALS — BP 130/90 | HR 74 | Temp 98.3°F | Resp 16 | Wt 240.0 lb

## 2014-04-13 DIAGNOSIS — K529 Noninfective gastroenteritis and colitis, unspecified: Secondary | ICD-10-CM

## 2014-04-13 DIAGNOSIS — K921 Melena: Secondary | ICD-10-CM

## 2014-04-13 NOTE — Progress Notes (Signed)
Subjective: Here for diarrhea and blood in stool.  She notes that symptoms started 6 weeks ago.  Started getting loose stools 6 wk ago, had been eating a lot of raw almonds at that time.   Prior to that only had BMs 2-3 x per week, but since then started getting the loose stools.  Stopped the almonds but stools c/t to be loose.  Has fecal urgency, 2-3 BMs daily, watery diarrhea.   This past Tuesday started getting blood on toilet paper. Has had blood the last few days.   Saw some drops of blood in toilet yesterday.  She has had nausea and gas the last 2-3 weeks.  No prior colonoscopy, has one scheduled for December for routine screening.  No recent travel, no recent unusual foods to suspect food poisoning, no new animal contacts, no recent antibiotics.  No fever, no chills, no body aches, no bloating, no pain.  No vomiting.   No hx/o family or self GI diagnosis other than personal hemorrhoids in the past with pregnancy.   No other aggravating or relieving factors.   No other c/o.   Past Medical History  Diagnosis Date  . Asthma   . Seasonal allergic rhinitis   . Obesity   . Urinary tract infection 2013  . Chronic headache     many years  . Arthralgia   . Wears glasses   . S/P hysterectomy     due to fibroids  . Family history of premature coronary artery disease     father  . Former smoker     10 pack year history  . Pectus excavatum     mild, left asymmetric  . H/O cardiovascular stress test 2009   ROS as in subjective   Objective: Filed Vitals:   04/13/14 1116  BP: 130/90  Pulse: 74  Temp: 98.3 F (36.8 C)  Resp: 16    General appearance: alert, no distress, WD/WN Neck: supple, no lymphadenopathy, no thyromegaly, no masses Heart: RRR, normal S1, S2, no murmurs Lungs: CTA bilaterally, no wheezes, rhonchi, or rales Abdomen: +bs, soft, non tender, non distended, no masses, no hepatomegaly, no splenomegaly Pulses: 2+ symmetric, upper and lower extremities, normal cap  refill DRE: Small external hemorrhoid, rectal tone normal, no obvious lesion, occult stool negative  Assessment: Encounter Diagnoses  Name Primary?  . Chronic diarrhea Yes  . Blood in stool     Plan: Discussed numerous possibilities as of causes of chronic diarrhea and blood in the stool.  The only change in her routine is been increase in Almond consumption but otherwise no other obvious triggers or infection.  Discussed stool collection, she will do stool studies for ova and parasites, stool culture, lactoferrin, C. difficile, and will continue with plan to see gastroenterology for colonoscopy in December as planned.  Advised that she can use Pepto-Bismol, good hydration, more bland diet, probiotic, smaller portions and see if this helps.

## 2014-04-16 ENCOUNTER — Other Ambulatory Visit: Payer: Self-pay | Admitting: Medical

## 2014-04-17 LAB — C. DIFFICILE GDH AND TOXIN A/B
C. difficile GDH: NOT DETECTED
C. difficile Toxin A/B: NOT DETECTED

## 2014-04-17 LAB — OVA AND PARASITE EXAMINATION: OP: NONE SEEN

## 2014-04-17 LAB — FECAL LACTOFERRIN, QUANT: Lactoferrin: POSITIVE

## 2014-04-20 LAB — STOOL CULTURE

## 2014-05-17 ENCOUNTER — Ambulatory Visit (AMBULATORY_SURGERY_CENTER): Payer: Self-pay

## 2014-05-17 VITALS — Ht 71.0 in | Wt 238.6 lb

## 2014-05-17 DIAGNOSIS — K921 Melena: Secondary | ICD-10-CM

## 2014-05-17 NOTE — Progress Notes (Signed)
Pt came into the office today for her pre-visit prior to her colonoscopy on 05/28/14 with Dr Carlean Purl.. Pt has had bloody diarrhea for over a month and has a lot questions and concerns prior to her colon on 05/28/14.We will not cancel her colon at this time but scheduled pt for an office visit with Alinda Dooms. (PA) on 05/18/14 to evaluate bloody stools.Pt will call back if she has further questions.Katie Robinson

## 2014-05-18 ENCOUNTER — Encounter: Payer: Self-pay | Admitting: Physician Assistant

## 2014-05-18 ENCOUNTER — Other Ambulatory Visit (INDEPENDENT_AMBULATORY_CARE_PROVIDER_SITE_OTHER): Payer: 59

## 2014-05-18 ENCOUNTER — Ambulatory Visit (INDEPENDENT_AMBULATORY_CARE_PROVIDER_SITE_OTHER): Payer: 59 | Admitting: Physician Assistant

## 2014-05-18 VITALS — BP 142/98 | HR 84 | Ht 70.25 in | Wt 241.0 lb

## 2014-05-18 DIAGNOSIS — K625 Hemorrhage of anus and rectum: Secondary | ICD-10-CM | POA: Insufficient documentation

## 2014-05-18 DIAGNOSIS — K5150 Left sided colitis without complications: Secondary | ICD-10-CM | POA: Insufficient documentation

## 2014-05-18 DIAGNOSIS — R197 Diarrhea, unspecified: Secondary | ICD-10-CM

## 2014-05-18 MED ORDER — POLYETHYLENE GLYCOL 3350 17 GM/SCOOP PO POWD: 1.0000 | Freq: Every day | ORAL | Status: DC

## 2014-05-18 NOTE — Progress Notes (Signed)
Patient ID: Katie Robinson, female   DOB: 11-30-1955, 58 y.o.   MRN: [id number]    HPI:   [name] is a 58 year old black female referred for evaluation by Chana Bode, PA-C, due to bloody diarrhea.  Amaia states that she had a normal formed bowel movement on a daily basis until September 2015. During the summer, she had started eating raw almonds after meals at the advice of Dr. Irena Cords on TV as his TV show explained that it would help regulate bowel movements. In September, she started having watery diarrhea 2-3 times per day she discontinued the almonds. She has had several episodes of nocturnal diarrhea she has urgency with her bowel movements and has had an accident she has no abdominal pain but she does have mucus with her bowel movements and has had blood mixed in with the stool and dripping in the commode she does have a sensation of incomplete evacuation. Prior to the onset of her diarrhea she had not had any antibiotics, had not traveled outside of the [place], and had not required any new pets. She has city water. There is no known family history of colon cancer, colon polyps, or inflammatory bowel disease she had been getting a few take her sores on her tongue, but these have cleared. She uses Aleve occasionally for headaches but not on a regular basis. Her appetite has been good. Her weight has been stable.   Past Medical History  Diagnosis Date  . Asthma   . Seasonal allergic rhinitis   . Obesity   . Urinary tract infection 09/01/2011  . Chronic headache     many years  . Arthralgia   . Wears glasses   . S/P hysterectomy     due to fibroids  . Family history of premature coronary artery disease     father  . Former smoker     10 pack year history  . Pectus excavatum     mild, left asymmetric  . H/O cardiovascular stress test 09/01/07    Past Surgical History  Procedure Laterality Date  . Total abdominal hysterectomy  Aug 31, 2000    total; Dr. Molli Posey  . Foot tendon surgery  Left 09/01/11  . Tonsillectomy    . Wisdom tooth extraction     Family History  Problem Relation Age of Onset  . Asthma Father   . Heart disease Father 53    died of MI mid 08-31-22  . Hypertension Sister   . Asthma Sister   . Hypertension Mother   . Aneurysm Mother     brain  . Alzheimer's disease Mother   . Cancer Neg Hx   . Stroke Neg Hx   . Diabetes Neg Hx    History  Substance Use Topics  . Smoking status: Former Smoker    Types: Cigarettes    Quit date: 06/15/1988  . Smokeless tobacco: Never Used  . Alcohol Use: 0.0 oz/week    0 Not specified per week     Comment: rarely-occasionally   Current Outpatient Prescriptions  Medication Sig Dispense Refill  . albuterol (PROVENTIL HFA;VENTOLIN HFA) 108 (90 BASE) MCG/ACT inhaler Inhale 2 puffs into the lungs every 6 (six) hours as needed for wheezing or shortness of breath. 1 Inhaler 1  . albuterol (PROVENTIL) (2.5 MG/3ML) 0.083% nebulizer solution Take 3 mLs (2.5 mg total) by nebulization every 6 (six) hours as needed for wheezing or shortness of breath. 75 mL 1  . budesonide-formoterol (SYMBICORT) 160-4.5 MCG/ACT inhaler Inhale 2  puffs into the lungs 2 (two) times daily. 1 Inhaler 2  . polyethylene glycol powder (GLYCOLAX/MIRALAX) powder Take 255 g by mouth daily. 255 g 0   No current facility-administered medications for this visit.   No Known Allergies   Review of Systems: Gen: Denies any fever, chills, sweats, anorexia, fatigue, weakness, malaise, weight loss, and sleep disorder CV: Denies chest pain, angina, palpitations, syncope, orthopnea, PND, peripheral edema, and claudication. Resp: Denies dyspnea at rest, dyspnea with exercise, cough, sputum, wheezing, coughing up blood, and pleurisy. GI: Denies vomiting blood, jaundice, and fecal incontinence.   Denies dysphagia or odynophagia. GU : Denies urinary burning, blood in urine, urinary frequency, urinary hesitancy, nocturnal urination, and urinary incontinence. MS: Denies  joint pain, limitation of movement, and swelling, stiffness, low back pain, extremity pain. Denies muscle weakness, cramps, atrophy.  Derm: Denies rash, itching, dry skin, hives, moles, warts, or unhealing ulcers.  Psych: Denies depression, anxiety, memory loss, suicidal ideation, hallucinations, paranoia, and confusion. Heme: Denies bruising, bleeding, and enlarged lymph nodes. Neuro:  Denies any headaches, dizziness, paresthesias. Endo:  Denies any problems with DM, thyroid, adrenal function   LAB RESULTS: Stool for C. difficile on 04/16/2014 was negative. Stool lactoferrin was positive.   Physical Exam: BP 142/98 mmHg  Pulse 84  Ht 5' 10.25" (1.784 m)  Wt 241 lb (109.317 kg)  BMI 34.35 kg/m2 Constitutional: Pleasant,well-developed, black female in no acute distress. HEENT: Normocephalic and atraumatic. Conjunctivae are normal. No scleral icterus. Neck supple.  Cardiovascular: Normal rate, regular rhythm.  Pulmonary/chest: Effort normal and breath sounds normal. No wheezing, rales or rhonchi. Abdominal: Soft, nondistended, nontender. Bowel sounds active throughout. There are no masses palpable. No hepatomegaly. Rectal: scant brown stool, heme negative Extremities: no edema Lymphadenopathy: No cervical adenopathy noted. Neurological: Alert and oriented to person place and time. Skin: Skin is warm and dry. No rashes noted. Psychiatric: Normal mood and affect. Behavior is normal.  ASSESSMENT AND PLAN: 58 year old female with a 3 month history of bloody diarrhea referred for evaluation. A CBC with differential will be obtained. She will be scheduled for a colonoscopy to evaluate for polyps, neoplasia, or any inflammatory process.The risks, benefits, and alternatives to colonoscopy with possible biopsy and possible polypectomy were discussed with the patient and they consent to proceed. The procedure will be scheduled with Dr. Carlean Purl. Further recommendations will be made pending the  findings of her colonoscopy.    Mansour Balboa, Vita Barley PA-C 05/18/2014, 3:43 PM

## 2014-05-18 NOTE — Patient Instructions (Signed)
Sent generic Miralax prescription to [facility] [address]. You have been scheduled for a colonoscopy. Please follow written instructions given to you at your visit today.   If you use inhalers (even only as needed), please bring them with you on the day of your procedure.

## 2014-05-19 LAB — CBC WITH DIFFERENTIAL/PLATELET
Basophils Absolute: 0 10*3/uL (ref 0.0–0.1)
Basophils Relative: 0.2 % (ref 0.0–3.0)
Eosinophils Absolute: 1.7 10*3/uL — ABNORMAL HIGH (ref 0.0–0.7)
Eosinophils Relative: 15.8 % — ABNORMAL HIGH (ref 0.0–5.0)
HCT: 39.8 % (ref 36.0–46.0)
Hemoglobin: 12.8 g/dL (ref 12.0–15.0)
Lymphocytes Relative: 29.6 % (ref 12.0–46.0)
Lymphs Abs: 3.1 10*3/uL (ref 0.7–4.0)
MCHC: 32.1 g/dL (ref 30.0–36.0)
MCV: 83.8 fl (ref 78.0–100.0)
Monocytes Absolute: 0.9 10*3/uL (ref 0.1–1.0)
Monocytes Relative: 8.6 % (ref 3.0–12.0)
Neutro Abs: 4.9 10*3/uL (ref 1.4–7.7)
Neutrophils Relative %: 45.8 % (ref 43.0–77.0)
Platelets: 301 10*3/uL (ref 150.0–400.0)
RBC: 4.74 Mil/uL (ref 3.87–5.11)
RDW: 14.7 % (ref 11.5–15.5)
WBC: 10.6 10*3/uL — ABNORMAL HIGH (ref 4.0–10.5)

## 2014-05-21 ENCOUNTER — Encounter: Payer: Self-pay | Admitting: Internal Medicine

## 2014-05-21 ENCOUNTER — Other Ambulatory Visit: Payer: Self-pay | Admitting: *Deleted

## 2014-05-21 ENCOUNTER — Ambulatory Visit (AMBULATORY_SURGERY_CENTER): Payer: 59 | Admitting: Internal Medicine

## 2014-05-21 VITALS — BP 130/75 | HR 73 | Temp 98.0°F | Resp 20 | Ht 70.25 in | Wt 241.0 lb

## 2014-05-21 DIAGNOSIS — R7989 Other specified abnormal findings of blood chemistry: Secondary | ICD-10-CM

## 2014-05-21 DIAGNOSIS — D124 Benign neoplasm of descending colon: Secondary | ICD-10-CM

## 2014-05-21 DIAGNOSIS — D126 Benign neoplasm of colon, unspecified: Secondary | ICD-10-CM

## 2014-05-21 DIAGNOSIS — K625 Hemorrhage of anus and rectum: Secondary | ICD-10-CM

## 2014-05-21 DIAGNOSIS — K51511 Left sided colitis with rectal bleeding: Secondary | ICD-10-CM

## 2014-05-21 DIAGNOSIS — D123 Benign neoplasm of transverse colon: Secondary | ICD-10-CM

## 2014-05-21 MED ORDER — SODIUM CHLORIDE 0.9 % IV SOLN: 500.0000 mL | INTRAVENOUS | Status: DC

## 2014-05-21 MED ORDER — MESALAMINE 1.2 G PO TBEC: 2.4000 g | DELAYED_RELEASE_TABLET | Freq: Every day | ORAL | Status: DC

## 2014-05-21 NOTE — Progress Notes (Signed)
Agree w/ Ms. Hvozdovic's note and mangement.

## 2014-05-21 NOTE — Progress Notes (Signed)
A/ox3, pleased with MAC, report to RN 

## 2014-05-21 NOTE — Patient Instructions (Addendum)
It looks like you have ulcerative colitis. Please read the handout. I also found and removed 2 polyps.  I am going to start treatment with Lialda - Rx sent to pharmacy. This can take 8-12 weeks to see full effects.  We will arrange f/u after I review biopsies.  I appreciate the opportunity to care for you. Gatha Mayer, MD, FACG  YOU HAD AN ENDOSCOPIC PROCEDURE TODAY AT [facility]: Refer to the procedure report that was given to you for any specific questions about what was found during the examination.  If the procedure report does not answer your questions, please call your gastroenterologist to clarify.  If you requested that your care partner not be given the details of your procedure findings, then the procedure report has been included in a sealed envelope for you to review at your convenience later.  YOU SHOULD EXPECT: Some feelings of bloating in the abdomen. Passage of more gas than usual.  Walking can help get rid of the air that was put into your GI tract during the procedure and reduce the bloating. If you had a lower endoscopy (such as a colonoscopy or flexible sigmoidoscopy) you may notice spotting of blood in your stool or on the toilet paper. If you underwent a bowel prep for your procedure, then you may not have a normal bowel movement for a few days.  DIET: Your first meal following the procedure should be a light meal and then it is ok to progress to your normal diet.  A half-sandwich or bowl of soup is an example of a good first meal.  Heavy or fried foods are harder to digest and may make you feel nauseous or bloated.  Likewise meals heavy in dairy and vegetables can cause extra gas to form and this can also increase the bloating.  Drink plenty of fluids but you should avoid alcoholic beverages for 24 hours.  ACTIVITY: Your care partner should take you home directly after the procedure.  You should plan to take it easy, moving slowly for the rest of  the day.  You can resume normal activity the day after the procedure however you should NOT DRIVE or use heavy machinery for 24 hours (because of the sedation medicines used during the test).    SYMPTOMS TO REPORT IMMEDIATELY: A gastroenterologist can be reached at any hour.  During normal business hours, 8:30 AM to 5:00 PM Monday through Friday, call [phone].  After hours and on weekends, please call the GI answering service at [phone] who will take a message and have the physician on call contact you.   Following lower endoscopy (colonoscopy or flexible sigmoidoscopy):  Excessive amounts of blood in the stool  Significant tenderness or worsening of abdominal pains  Swelling of the abdomen that is new, acute  Fever of 100F or higher    FOLLOW UP: If any biopsies were taken you will be contacted by phone or by letter within the next 1-3 weeks.  Call your gastroenterologist if you have not heard about the biopsies in 3 weeks.  Our staff will call the home number listed on your records the next business day following your procedure to check on you and address any questions or concerns that you may have at that time regarding the information given to you following your procedure. This is a courtesy call and so if there is no answer at the home number and we have not heard from you through the  emergency physician on call, we will assume that you have returned to your regular daily activities without incident.  SIGNATURES/CONFIDENTIALITY: You and/or your care partner have signed paperwork which will be entered into your electronic medical record.  These signatures attest to the fact that that the information above on your After Visit Summary has been reviewed and is understood.  Full responsibility of the confidentiality of this discharge information lies with you and/or your care-partner.  Ulcerative colitis and polyp handouts given.  Low fiber diet given per Dr. Celesta Aver  instructions.

## 2014-05-21 NOTE — Progress Notes (Signed)
Called to room to assist during endoscopic procedure.  Patient ID and intended procedure confirmed with present staff. Received instructions for my participation in the procedure from the performing physician.  

## 2014-05-22 ENCOUNTER — Telehealth: Payer: Self-pay

## 2014-05-22 NOTE — Telephone Encounter (Signed)
  Follow up Call-  Call back number 05/21/2014  Post procedure Call Back phone  # [phone]  Permission to leave phone message Yes     Patient questions:  Do you have a fever, pain , or abdominal swelling? No. Pain Score  0 *  Have you tolerated food without any problems? Yes.    Have you been able to return to your normal activities? Yes.    Do you have any questions about your discharge instructions: Diet   No. Medications  No. Follow up visit  No.  Do you have questions or concerns about your Care? No.  Actions: * If pain score is 4 or above: No action needed, pain <4.

## 2014-05-23 NOTE — Op Note (Signed)
[place]  [address]. [place], [address]   COLONOSCOPY PROCEDURE REPORT  PATIENT: Katie Robinson, Katie Robinson  MR#: [id number] BIRTHDATE: 1956-01-25 , 58  yrs. old GENDER: female ENDOSCOPIST: Gatha Mayer, MD, [facility] PROCEDURE DATE:  05/21/2014 PROCEDURE:   Colonoscopy with biopsy and Colonoscopy with snare polypectomy First Screening Colonoscopy - Avg.  risk and is 50 yrs.  old or older - No.  Prior Negative Screening - Now for repeat screening. N/A  History of Adenoma - Now for follow-up colonoscopy & has been > or = to 3 yrs.  N/A  Polyps Removed Today? Yes. ASA CLASS:   Class II INDICATIONS:rectal bleeding. MEDICATIONS: Propofol 400 mg IV and Monitored anesthesia care  DESCRIPTION OF PROCEDURE:   After the risks benefits and alternatives of the procedure were thoroughly explained, informed consent was obtained.  The digital rectal exam revealed no abnormalities of the rectum.   The LB [id number]  endoscope was introduced through the anus and advanced to the cecum, which was identified by both the appendix and ileocecal valve. No adverse events experienced.   The quality of the prep was adequate, using MiraLax  The instrument was then slowly withdrawn as the colon was fully examined.      COLON FINDINGS: 1) Left sided colitis - mild-moderate with granular mucosa and aphthous ulcers.  Confluent rectum to proximal sigmoid (40 cm).  Biopsies taken.  2) 8 mm splenic flexure polyp and 5 mm descending polyp - cold snared and both sent to pathology 3) Otherwise normal - random biopsies taken.  Could not enter terminal ileum.  Retroflexed views revealed no abnormalities. The time to cecum=4 minutes 10 seconds.  Withdrawal time=15 minutes 36 seconds. The scope was withdrawn and the procedure completed. COMPLICATIONS: There were no immediate complications.  ENDOSCOPIC IMPRESSION: 1) Left sided colitis - mild-moderate 2) 8 mm splenic flexure polyp and 5 mm  descending polyp - cold snared 3) Otherwise normal - random biopsies taken.  Could not enter terminal ileum  RECOMMENDATIONS: Lialda 2.4 g daily Await biopsies and will call  eSigned:  Gatha Mayer, MD, [facility] 05/21/2014 2:11 PM   cc: Dorothea Ogle PA-C and The Patient

## 2014-05-24 ENCOUNTER — Encounter: Payer: Self-pay | Admitting: Internal Medicine

## 2014-05-24 DIAGNOSIS — Z860101 Personal history of adenomatous and serrated colon polyps: Secondary | ICD-10-CM | POA: Insufficient documentation

## 2014-05-24 DIAGNOSIS — Z86010 Personal history of colonic polyps: Secondary | ICD-10-CM

## 2014-05-24 HISTORY — DX: Personal history of colonic polyps: Z86.010

## 2014-05-24 HISTORY — DX: Personal history of adenomatous and serrated colon polyps: Z86.0101

## 2014-05-24 NOTE — Progress Notes (Signed)
Quick Note:  Office  1) she has left-sided UC - hope Lialda I rxed will take care of this - NEEDS REV w/ ME early Feb, call back sooner prn problems 2) had 2 benign colon polyps - repeat colonoscopy 2020  [facility] - no letter 05/2019 colon recall ______

## 2014-05-28 ENCOUNTER — Encounter: Payer: 59 | Admitting: Internal Medicine

## 2014-07-18 ENCOUNTER — Encounter: Payer: Self-pay | Admitting: Internal Medicine

## 2014-07-18 ENCOUNTER — Ambulatory Visit (INDEPENDENT_AMBULATORY_CARE_PROVIDER_SITE_OTHER): Payer: 59 | Admitting: Internal Medicine

## 2014-07-18 VITALS — BP 120/76 | HR 100 | Ht 70.25 in | Wt 235.1 lb

## 2014-07-18 DIAGNOSIS — E559 Vitamin D deficiency, unspecified: Secondary | ICD-10-CM | POA: Insufficient documentation

## 2014-07-18 DIAGNOSIS — K5150 Left sided colitis without complications: Secondary | ICD-10-CM

## 2014-07-18 HISTORY — DX: Vitamin D deficiency, unspecified: E55.9

## 2014-07-18 MED ORDER — VITAMIN D (ERGOCALCIFEROL) 1.25 MG (50000 UNIT) PO CAPS: 50000.0000 [IU] | ORAL_CAPSULE | ORAL | Status: DC

## 2014-07-18 MED ORDER — MESALAMINE 1.2 G PO TBEC: 4.8000 g | DELAYED_RELEASE_TABLET | Freq: Every day | ORAL | Status: DC

## 2014-07-18 NOTE — Patient Instructions (Addendum)
We have sent the following medications to your pharmacy for you to pick up at your convenience: Vitamin D, Lialda  Increase your Lialda to 4.70m daily.  You may either take 2 tablets twice a day or 4 tablets once a day.  Follow up with Dr GCarlean Purlin 3 months.   I appreciate the opportunity to care for you. CSilvano Rusk M.D. , F[facility]

## 2014-07-18 NOTE — Assessment & Plan Note (Addendum)
Improving but think may benefit from increased dose of mesalamine Increase to 4.8 g daily RTC 3 months

## 2014-07-19 ENCOUNTER — Encounter: Payer: Self-pay | Admitting: Internal Medicine

## 2014-07-19 NOTE — Assessment & Plan Note (Signed)
Level 14 in 2014 Tx 50K vit D U weekly x 8 and reassess

## 2014-07-19 NOTE — Progress Notes (Signed)
   Subjective:    Patient ID: Katie Robinson, female    DOB: 11-Feb-1956, 59 y.o.   MRN: [id number] Cc: F/U ulcerative colitis  HPI Nice lady here after recent dx of left-side UC at colonoscopy - 2 months ago. Started Lialda 2.4 g daily and has improved from watery stools and frequent bleeding to 2 soft stools and occasional rectal bleeding.  Otherwise ok  Medications, allergies, past medical history, past surgical history, family history and social history are reviewed and updated in the EMR.  Review of Systems As above    Objective:   Physical Exam  BP 120/76 mmHg  Pulse 100  Ht 5' 10.25" (1.784 m)  Wt 235 lb 2 oz (106.652 kg)  BMI 33.51 kg/m2 WDWN NAD      Assessment & Plan:  Left sided ulcerative (chronic) colitis Improving but think may benefit from increased dose of mesalamine Increase to 4.8 g daily RTC 3 months    Vitamin D deficiency Level 14 in 2014 Tx 50K vit D U weekly x 8 and reassess

## 2014-09-17 ENCOUNTER — Other Ambulatory Visit: Payer: Self-pay

## 2014-09-17 ENCOUNTER — Other Ambulatory Visit: Payer: Self-pay | Admitting: Internal Medicine

## 2014-09-17 DIAGNOSIS — E559 Vitamin D deficiency, unspecified: Secondary | ICD-10-CM

## 2014-09-17 NOTE — Telephone Encounter (Signed)
At your office visit in Feb. You said reassess after 2 mos on the weekly Vit D.  Please advise if you want her to have a vit d level drawn.  Thank you Sir.

## 2014-09-17 NOTE — Telephone Encounter (Signed)
Left message on cell# for patient to call me on so I can relay the message about blood work needed. Home # did not accept messages.  Vitamin D test ordered.

## 2014-09-17 NOTE — Telephone Encounter (Signed)
She needs a vitamin D level Thanks

## 2014-09-18 NOTE — Telephone Encounter (Signed)
Spoke with patient and made her aware of the lab hours.

## 2014-09-20 ENCOUNTER — Other Ambulatory Visit (INDEPENDENT_AMBULATORY_CARE_PROVIDER_SITE_OTHER): Payer: 59

## 2014-09-20 DIAGNOSIS — E559 Vitamin D deficiency, unspecified: Secondary | ICD-10-CM

## 2014-09-20 LAB — VITAMIN D 25 HYDROXY (VIT D DEFICIENCY, FRACTURES): VITD: 18.54 ng/mL — ABNORMAL LOW (ref 30.00–100.00)

## 2014-09-21 NOTE — Progress Notes (Signed)
Quick Note:  Vit D is low Please rx vit D3 50K U weekly x 8 weeks (#8 no RF) ______

## 2014-09-24 ENCOUNTER — Other Ambulatory Visit: Payer: Self-pay

## 2014-09-24 MED ORDER — VITAMIN D (ERGOCALCIFEROL) 1.25 MG (50000 UNIT) PO CAPS: 50000.0000 [IU] | ORAL_CAPSULE | ORAL | Status: DC

## 2015-04-24 ENCOUNTER — Encounter (HOSPITAL_COMMUNITY): Payer: Self-pay | Admitting: Emergency Medicine

## 2015-04-24 ENCOUNTER — Emergency Department (HOSPITAL_COMMUNITY)
Admission: EM | Admit: 2015-04-24 | Discharge: 2015-04-24 | Disposition: A | Discharge status: Home / Self Care | Payer: 59 | Attending: Emergency Medicine | Admitting: Emergency Medicine

## 2015-04-24 ENCOUNTER — Emergency Department (EMERGENCY_DEPARTMENT_HOSPITAL): Payer: 59

## 2015-04-24 DIAGNOSIS — M7989 Other specified soft tissue disorders: Secondary | ICD-10-CM | POA: Diagnosis not present

## 2015-04-24 DIAGNOSIS — L52 Erythema nodosum: Secondary | ICD-10-CM | POA: Insufficient documentation

## 2015-04-24 DIAGNOSIS — Q676 Pectus excavatum: Secondary | ICD-10-CM | POA: Insufficient documentation

## 2015-04-24 DIAGNOSIS — Z86010 Personal history of colonic polyps: Secondary | ICD-10-CM | POA: Diagnosis not present

## 2015-04-24 DIAGNOSIS — J45901 Unspecified asthma with (acute) exacerbation: Secondary | ICD-10-CM | POA: Diagnosis not present

## 2015-04-24 DIAGNOSIS — R2242 Localized swelling, mass and lump, left lower limb: Secondary | ICD-10-CM | POA: Diagnosis present

## 2015-04-24 DIAGNOSIS — Z862 Personal history of diseases of the blood and blood-forming organs and certain disorders involving the immune mechanism: Secondary | ICD-10-CM | POA: Diagnosis not present

## 2015-04-24 DIAGNOSIS — E559 Vitamin D deficiency, unspecified: Secondary | ICD-10-CM | POA: Insufficient documentation

## 2015-04-24 DIAGNOSIS — Z79899 Other long term (current) drug therapy: Secondary | ICD-10-CM | POA: Diagnosis not present

## 2015-04-24 DIAGNOSIS — Z87891 Personal history of nicotine dependence: Secondary | ICD-10-CM | POA: Diagnosis not present

## 2015-04-24 DIAGNOSIS — M79609 Pain in unspecified limb: Secondary | ICD-10-CM | POA: Diagnosis not present

## 2015-04-24 DIAGNOSIS — Z973 Presence of spectacles and contact lenses: Secondary | ICD-10-CM | POA: Insufficient documentation

## 2015-04-24 DIAGNOSIS — R2241 Localized swelling, mass and lump, right lower limb: Secondary | ICD-10-CM | POA: Insufficient documentation

## 2015-04-24 DIAGNOSIS — J45909 Unspecified asthma, uncomplicated: Secondary | ICD-10-CM

## 2015-04-24 DIAGNOSIS — E669 Obesity, unspecified: Secondary | ICD-10-CM | POA: Diagnosis not present

## 2015-04-24 DIAGNOSIS — K5150 Left sided colitis without complications: Secondary | ICD-10-CM | POA: Diagnosis not present

## 2015-04-24 LAB — CBC WITH DIFFERENTIAL/PLATELET
Basophils Absolute: 0 10*3/uL (ref 0.0–0.1)
Basophils Relative: 0 %
EOS ABS: 0.1 10*3/uL (ref 0.0–0.7)
Eosinophils Relative: 1 %
HCT: 38.8 % (ref 36.0–46.0)
Hemoglobin: 12.4 g/dL (ref 12.0–15.0)
LYMPHS ABS: 2 10*3/uL (ref 0.7–4.0)
LYMPHS PCT: 15 %
MCH: 26.1 pg (ref 26.0–34.0)
MCHC: 32 g/dL (ref 30.0–36.0)
MCV: 81.7 fL (ref 78.0–100.0)
MONOS PCT: 11 %
Monocytes Absolute: 1.5 10*3/uL — ABNORMAL HIGH (ref 0.1–1.0)
Neutro Abs: 10.2 10*3/uL — ABNORMAL HIGH (ref 1.7–7.7)
Neutrophils Relative %: 73 %
Platelets: 371 10*3/uL (ref 150–400)
RBC: 4.75 MIL/uL (ref 3.87–5.11)
RDW: 14.4 % (ref 11.5–15.5)
WBC: 13.9 10*3/uL — ABNORMAL HIGH (ref 4.0–10.5)

## 2015-04-24 LAB — URINALYSIS, ROUTINE W REFLEX MICROSCOPIC
Bilirubin Urine: NEGATIVE
GLUCOSE, UA: NEGATIVE mg/dL
HGB URINE DIPSTICK: NEGATIVE
Ketones, ur: NEGATIVE mg/dL
Nitrite: POSITIVE — AB
PH: 6.5 (ref 5.0–8.0)
Protein, ur: NEGATIVE mg/dL
Specific Gravity, Urine: 1.021 (ref 1.005–1.030)
Urobilinogen, UA: 1 mg/dL (ref 0.0–1.0)

## 2015-04-24 LAB — URINE MICROSCOPIC-ADD ON

## 2015-04-24 LAB — BASIC METABOLIC PANEL
Anion gap: 8 (ref 5–15)
BUN: 12 mg/dL (ref 6–20)
CO2: 26 mmol/L (ref 22–32)
CREATININE: 0.78 mg/dL (ref 0.44–1.00)
Calcium: 9.9 mg/dL (ref 8.9–10.3)
Chloride: 107 mmol/L (ref 101–111)
GFR calc Af Amer: >60 mL/min (ref >60)
GFR calc non Af Amer: >60 mL/min (ref >60)
GLUCOSE: 91 mg/dL (ref 65–99)
POTASSIUM: 3.3 mmol/L — AB (ref 3.5–5.1)
SODIUM: 141 mmol/L (ref 135–145)

## 2015-04-24 MED ORDER — MELOXICAM 7.5 MG PO TABS: 7.5000 mg | ORAL_TABLET | Freq: Every day | ORAL | Status: DC

## 2015-04-24 MED ORDER — HYDROCODONE-ACETAMINOPHEN 5-325 MG PO TABS: 1.0000 | ORAL_TABLET | ORAL | Status: DC | PRN

## 2015-04-24 MED ORDER — IPRATROPIUM-ALBUTEROL 0.5-2.5 (3) MG/3ML IN SOLN
3.0000 mL | Freq: Once | RESPIRATORY_TRACT | Status: AC
  Administered 2015-04-24: 3 mL via RESPIRATORY_TRACT
  Filled 2015-04-24: qty 3

## 2015-04-24 MED ORDER — ALBUTEROL SULFATE HFA 108 (90 BASE) MCG/ACT IN AERS
2.0000 | INHALATION_SPRAY | Freq: Once | RESPIRATORY_TRACT | Status: AC
  Administered 2015-04-24: 2 via RESPIRATORY_TRACT
  Filled 2015-04-24: qty 6.7

## 2015-04-24 NOTE — Discharge Instructions (Signed)
Read the information below.  Use the prescribed medication as directed.  Please discuss all new medications with your pharmacist.  Do not take additional tylenol while taking the prescribed pain medication to avoid overdose.  You may return to the Emergency Department at any time for worsening condition or any new symptoms that concern you.   If you develop uncontrolled pain, weakness or numbness of the extremity, severe discoloration of the skin, or you are unable to walk, return to the ER for a recheck.      Erythema Nodosum Erythema nodosum is a skin condition in which patches of fat under the skin of the lower legs become inflamed. This causes painful bumps (nodules) to form. CAUSES Common causes of this condition include:  Infections.  Certain medicines, especially birth control pills, penicillin, and sulfa medicines. Other causes include:  Pregnancy.  Certain inflammatory conditions, including Lupus, Crohn's disease, and thyroid conditions. In some cases, the cause may not be known. RISK FACTORS This condition is more likely to develop in young adult women. SYMPTOMS The main symptom of this condition is large nodules that look like raised bruises and are tender to the touch. These nodules usually appear on the shins, but they may also appear on the arms or the trunk. They gradually change in color from pink to brown, and they leave a dark mark that clears up in several months. Other symptoms include:  Fever.  Fatigue.  Joint pain.  Itchiness. DIAGNOSIS This condition is diagnosed based on symptoms. To find the underlying condition that caused the erythema nodosum, your health care provider may also do a physical exam, X-rays, and blood tests. TREATMENT Treatment for this condition depends on the cause. The nodules usually go away with treatment of the underlying condition. Any pain or discomfort may be treated with:  Anti-inflammatory medicines.  Bed rest.  Raising  (elevating) the affected area.  Cool compresses. In some cases, steroids and potassium iodide tablets may be given. HOME CARE INSTRUCTIONS  Take medicines only as directed by your health care provider.  Stay in bed for as long as directed by your health care provider.  Until your symptoms go away, limit any exercising that makes you breathe harder and faster (vigorous).  Elevate the affected leg as directed by your health care provider.  Apply cool compresses to the affected area as directed by your health care provider. SEEK MEDICAL CARE IF:  Your symptoms are not improving.  You have a fever that does not go away. SEEK IMMEDIATE MEDICAL CARE IF:  Your condition gets worse.  Your pain gets worse.  You have a sore throat.  You vomit repeatedly.   This information is not intended to replace advice given to you by your health care provider. Make sure you discuss any questions you have with your health care provider.   Document Released: 07/09/2004 Document Revised: 10/16/2014 Document Reviewed: 05/09/2014 Elsevier Interactive Patient Education 2016 Else[employer]  Asthma, Adult Asthma is a recurring condition in which the airways tighten and narrow. Asthma can make it difficult to breathe. It can cause coughing, wheezing, and shortness of breath. Asthma episodes, also called asthma attacks, range from minor to life-threatening. Asthma cannot be cured, but medicines and lifestyle changes can help control it. CAUSES Asthma is believed to be caused by inherited (genetic) and environmental factors, but its exact cause is unknown. Asthma may be triggered by allergens, lung infections, or irritants in the air. Asthma triggers are different for each person. Common triggers  include:   Animal dander.  Dust mites.  Cockroaches.  Pollen from trees or grass.  Mold.  Smoke.  Air pollutants such as dust, household cleaners, hair sprays, aerosol sprays, paint fumes, strong  chemicals, or strong odors.  Cold air, weather changes, and winds (which increase molds and pollens in the air).  Strong emotional expressions such as crying or laughing hard.  Stress.  Certain medicines (such as aspirin) or types of drugs (such as beta-blockers).  Sulfites in foods and drinks. Foods and drinks that may contain sulfites include dried fruit, potato chips, and sparkling grape juice.  Infections or inflammatory conditions such as the flu, a cold, or an inflammation of the nasal membranes (rhinitis).  Gastroesophageal reflux disease (GERD).  Exercise or strenuous activity. SYMPTOMS Symptoms may occur immediately after asthma is triggered or many hours later. Symptoms include:  Wheezing.  Excessive nighttime or early morning coughing.  Frequent or severe coughing with a common cold.  Chest tightness.  Shortness of breath. DIAGNOSIS  The diagnosis of asthma is made by a review of your medical history and a physical exam. Tests may also be performed. These may include:  Lung function studies. These tests show how much air you breathe in and out.  Allergy tests.  Imaging tests such as X-rays. TREATMENT  Asthma cannot be cured, but it can usually be controlled. Treatment involves identifying and avoiding your asthma triggers. It also involves medicines. There are 2 classes of medicine used for asthma treatment:   Controller medicines. These prevent asthma symptoms from occurring. They are usually taken every day.  Reliever or rescue medicines. These quickly relieve asthma symptoms. They are used as needed and provide short-term relief. Your health care provider will help you create an asthma action plan. An asthma action plan is a written plan for managing and treating your asthma attacks. It includes a list of your asthma triggers and how they may be avoided. It also includes information on when medicines should be taken and when their dosage should be changed. An  action plan may also involve the use of a device called a peak flow meter. A peak flow meter measures how well the lungs are working. It helps you monitor your condition. HOME CARE INSTRUCTIONS   Take medicines only as directed by your health care provider. Speak with your health care provider if you have questions about how or when to take the medicines.  Use a peak flow meter as directed by your health care provider. Record and keep track of readings.  Understand and use the action plan to help minimize or stop an asthma attack without needing to seek medical care.  Control your home environment in the following ways to help prevent asthma attacks:  Do not smoke. Avoid being exposed to secondhand smoke.  Change your heating and air conditioning filter regularly.  Limit your use of fireplaces and wood stoves.  Get rid of pests (such as roaches and mice) and their droppings.  Throw away plants if you see mold on them.  Clean your floors and dust regularly. Use unscented cleaning products.  Try to have someone else vacuum for you regularly. Stay out of rooms while they are being vacuumed and for a short while afterward. If you vacuum, use a dust mask from a hardware store, a double-layered or microfilter vacuum cleaner bag, or a vacuum cleaner with a HEPA filter.  Replace carpet with wood, tile, or vinyl flooring. Carpet can trap dander and dust.  Use  allergy-proof pillows, mattress covers, and box spring covers.  Wash bed sheets and blankets every week in hot water and dry them in a dryer.  Use blankets that are made of polyester or cotton.  Clean bathrooms and kitchens with bleach. If possible, have someone repaint the walls in these rooms with mold-resistant paint. Keep out of the rooms that are being cleaned and painted.  Wash hands frequently. SEEK MEDICAL CARE IF:   You have wheezing, shortness of breath, or a cough even if taking medicine to prevent attacks.  The colored  mucus you cough up (sputum) is thicker than usual.  Your sputum changes from clear or white to yellow, green, gray, or bloody.  You have any problems that may be related to the medicines you are taking (such as a rash, itching, swelling, or trouble breathing).  You are using a reliever medicine more than 2-3 times per week.  Your peak flow is still at 50-79% of your personal best after following your action plan for 1 hour.  You have a fever. SEEK IMMEDIATE MEDICAL CARE IF:   You seem to be getting worse and are unresponsive to treatment during an asthma attack.  You are short of breath even at rest.  You get short of breath when doing very little physical activity.  You have difficulty eating, drinking, or talking due to asthma symptoms.  You develop chest pain.  You develop a fast heartbeat.  You have a bluish color to your lips or fingernails.  You are light-headed, dizzy, or faint.  Your peak flow is less than 50% of your personal best.   This information is not intended to replace advice given to you by your health care provider. Make sure you discuss any questions you have with your health care provider.   Document Released: 06/01/2005 Document Revised: 02/20/2015 Document Reviewed: 12/29/2012 Elsevier Interactive Patient Education [employer].

## 2015-04-24 NOTE — Progress Notes (Signed)
VASCULAR LAB PRELIMINARY  PRELIMINARY  PRELIMINARY  PRELIMINARY  Bilateral lower extremity venous duplex completed.    Preliminary report:  Bilateral:  No evidence of DVT, superficial thrombosis, or Baker's Cyst.   Mylei Brackeen, RVS 04/24/2015, 7:43 PM

## 2015-04-24 NOTE — ED Provider Notes (Signed)
CSN: [id number]     Arrival date & time 04/24/15  1611 History   First MD Initiated Contact with Patient 04/24/15 1944     Chief Complaint  Patient presents with  . Foot Swelling     (Consider location/radiation/quality/duration/timing/severity/associated sxs/prior Treatment) The history is provided by the patient.     Patient with hx ulcerative colitis, asthma, presents with one week of lower extremity swelling, warmth, tenderness, with associated fevers.  The pain and swelling started in her right foot 7 days ago - initially felt like she accidentally kicked something (but remembers no trauma) then developed swelling, redness, warmth, and pain.  She spoke on the phone with her orthopedist and was prescribed prednisone and tylenol.  The right foot and ankle have improved.  Yesterday the left ankle and foot began with similar symptoms.  She also has mild pain in the left elbow that feels her her early symptoms.  Has had fever to 101 at home.  Has had mild cough. Denies CP, SOB.  Takes probiotics only for ulcerative colitis.    Past Medical History  Diagnosis Date  . Asthma   . Seasonal allergic rhinitis   . Obesity   . Urinary tract infection 08-16-11  . Chronic headache     many years  . Arthralgia   . Wears glasses   . S/P hysterectomy     due to fibroids  . Family history of premature coronary artery disease     father  . Former smoker     10 pack year history  . Pectus excavatum     mild, left asymmetric  . H/O cardiovascular stress test 16-Aug-2007  . Allergy     SEASONAL  . Anemia   . Left sided ulcerative (chronic) colitis ([place]) 05/18/2014  . Hx of adenomatous and sessile serrated colonic polyps 05/24/2014  . Vitamin D deficiency 07/18/2014   Past Surgical History  Procedure Laterality Date  . Total abdominal hysterectomy  08/15/00    total; Dr. Molli Posey  . Foot tendon surgery Left Aug 16, 2011  . Tonsillectomy    . Wisdom tooth extraction    . Navel removed      AS A CHILD  .  Colonoscopy     Family History  Problem Relation Age of Onset  . Asthma Father   . Heart disease Father 32    died of MI mid Aug 15, 2022  . Hypertension Sister   . Asthma Sister   . Hypertension Mother   . Aneurysm Mother     brain  . Alzheimer's disease Mother   . Heart disease Mother   . Cancer Neg Hx   . Stroke Neg Hx   . Diabetes Neg Hx    Social History  Substance Use Topics  . Smoking status: Former Smoker    Types: Cigarettes    Quit date: 06/15/1988  . Smokeless tobacco: Never Used  . Alcohol Use: 0.0 oz/week    0 Standard drinks or equivalent per week     Comment: rarely-occasionally   OB History    No data available     Review of Systems  All other systems reviewed and are negative.     Allergies  Review of patient's allergies indicates no known allergies.  Home Medications   Prior to Admission medications   Medication Sig Start Date End Date Taking? Authorizing Provider  albuterol (PROVENTIL HFA;VENTOLIN HFA) 108 (90 BASE) MCG/ACT inhaler Inhale 2 puffs into the lungs every 6 (six) hours as needed for  wheezing or shortness of breath. Patient not taking: Reported on 07/18/2014 10/12/13   Camelia Eng Tysinger, PA-C  albuterol (PROVENTIL) (2.5 MG/3ML) 0.083% nebulizer solution Take 3 mLs (2.5 mg total) by nebulization every 6 (six) hours as needed for wheezing or shortness of breath. Patient not taking: Reported on 07/18/2014 10/12/13   Camelia Eng Tysinger, PA-C  mesalamine (LIALDA) 1.2 G EC tablet Take 4 tablets (4.8 g total) by mouth daily with breakfast. 07/18/14   Gatha Mayer, MD  Vitamin D, Ergocalciferol, (DRISDOL) 50000 UNITS CAPS capsule Take 1 capsule (50,000 Units total) by mouth every 7 (seven) days. 09/24/14   Gatha Mayer, MD   BP 147/87 mmHg  Pulse 116  Temp(Src) 100 F (37.8 C) (Oral)  Resp 18  SpO2 99% Physical Exam  Constitutional: She appears well-developed and well-nourished. No distress.  HENT:  Head: Normocephalic and atraumatic.  Neck: Neck  supple.  Cardiovascular: Normal rate and regular rhythm.   Pulmonary/Chest: Effort normal. No respiratory distress. She has wheezes. She has no rales.  Abdominal: Soft. She exhibits no distension. There is no tenderness. There is no rebound and no guarding.  Musculoskeletal:  Right foot and ankle with mild edema, no discoloration or tenderness.  Full AROM of toes and foot.  Dorsalis pedis pulse intact. Calf nontender.    Left foot and ankle with diffuse edema, warm, few erythematous macules and two larger erythematous patches.  Tender to palpation.  Calf nontender.   Please see images for further illustration.    Neurological: She is alert.  Skin: She is not diaphoretic.  Nursing note and vitals reviewed.       ED Course  Procedures (including critical care time) Labs Review Labs Reviewed  CBC WITH DIFFERENTIAL/PLATELET - Abnormal; Notable for the following:    WBC 13.9 (*)    Neutro Abs 10.2 (*)    Monocytes Absolute 1.5 (*)    All other components within normal limits  BASIC METABOLIC PANEL - Abnormal; Notable for the following:    Potassium 3.3 (*)    All other components within normal limits  URINALYSIS, ROUTINE W REFLEX MICROSCOPIC (NOT AT [facility]) - Abnormal; Notable for the following:    APPearance CLOUDY (*)    Nitrite POSITIVE (*)    Leukocytes, UA MODERATE (*)    All other components within normal limits  URINE MICROSCOPIC-ADD ON - Abnormal; Notable for the following:    Bacteria, UA MANY (*)    All other components within normal limits  URINE CULTURE    Imaging Review No results found. I have personally reviewed and evaluated these images and lab results as part of my medical decision-making.   EKG Interpretation None       DVT study is negative.   9:01 PM Discussed pt with Dr Alfonse Spruce who will also see the patient.  Suspects erythema nodosum.    MDM   Final diagnoses:  Erythema nodosum  Asthma, unspecified asthma severity, uncomplicated    Afebrile,  nontoxic patient with erythema, edema, warmth, tenderness over lower extremities bilaterally with fever.  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  Seen also and diagnosed by Dr Alfonse Spruce who recommends GI follow up.  Pt also with mild wheezing from chronic asthma - neb treatment given with improvement.  Albuterol HFA given for home.  UA does appear infected - pt does have chronic urinary frequency but not recent changes - after discussion with patient, plan for urine culture with no antibiotics currently.  D/C home with mobic, norco, crutches, rolling walker, GI follow up.  Discussed result, findings, treatment, and follow up  with patient.  Pt given return precautions.  Pt verbalizes understanding and agrees with plan.       I doubt any other EMC precluding discharge at this time including, but not necessarily limited to the following:  Septic joint, gout, cellulitis, DVT, PE   Clayton Bibles, PA-C 04/25/15 0017  Harvel Quale, MD 04/25/15 1015

## 2015-04-24 NOTE — ED Notes (Signed)
Pt, sent by PCP, c/o increasing BLE swelling and cellulitis x 1 week.

## 2015-04-24 NOTE — ED Notes (Signed)
Bed: WTR6 Expected date:  Expected time:  Means of arrival:  Comments: [facility]- for doppler

## 2015-04-24 NOTE — ED Notes (Addendum)
Pt states that her R foot was swollen 1 week ago that has since gone down but the L foot is now swollen and warm. Sent by PCP. Alert and oriented.

## 2015-04-24 NOTE — ED Notes (Signed)
AVS explained in detail. Knows appropriate use of crutches and demonstrates the same. Knows to take medication as prescribed. Pt taken to car in wheelchair. Advised to follow up with PCP for further management. No other c/c.

## 2015-04-27 LAB — URINE CULTURE

## 2015-04-28 ENCOUNTER — Telehealth (HOSPITAL_BASED_OUTPATIENT_CLINIC_OR_DEPARTMENT_OTHER): Payer: Self-pay | Admitting: Emergency Medicine

## 2015-04-28 NOTE — Telephone Encounter (Signed)
Post ED Visit - Positive Culture Follow-up: Successful Patient Follow-Up  Culture assessed and recommendations reviewed by: []  Elenor Quinones, Pharm.D. []  Heide Guile, Pharm.D., BCPS []  Parks Neptune, Pharm.D. []  Alycia Rossetti, Pharm.D., BCPS [x]  [place], Pharm.D., BCPS, AAHIVP []  Legrand Como, Pharm.D., BCPS, AAHIVP []  Milus Glazier, Pharm.D. []  Stephens November, Pharm.D.  Positive urine culture E. coli  [x]  Patient discharged without antimicrobial prescription and treatment is now indicated []  Organism is resistant to prescribed ED discharge antimicrobial []  Patient with positive blood cultures  Changes discussed with ED provider: Zacarias Pontes PA New antibiotic prescription Septra DS one tab po bid x 3 days Called to [facility] patient, 04/28/15 1159   Hazle Nordmann 04/28/2015, 11:58 AM

## 2015-04-28 NOTE — Progress Notes (Signed)
ED Antimicrobial Stewardship Positive Culture Follow Up   Katie Robinson is an 59 y.o. female who presented to [facility] on 04/24/2015 with a chief complaint of  Chief Complaint  Patient presents with  . Foot Swelling    Recent Results (from the past 720 hour(s))  Urine culture     Status: None   Collection Time: 04/24/15  9:37 PM  Result Value Ref Range Status   Specimen Description URINE, RANDOM  Final   Special Requests NONE  Final   Culture   Final    >=100,000 COLONIES/mL ESCHERICHIA COLI Performed at [facility]    Report Status 04/27/2015 FINAL  Final   Organism ID, Bacteria ESCHERICHIA COLI  Final      Susceptibility   Escherichia coli - MIC*    AMPICILLIN <=2 SENSITIVE Sensitive     CEFAZOLIN <=4 SENSITIVE Sensitive     CEFTRIAXONE <=1 SENSITIVE Sensitive     CIPROFLOXACIN <=0.25 SENSITIVE Sensitive     GENTAMICIN <=1 SENSITIVE Sensitive     IMIPENEM <=0.25 SENSITIVE Sensitive     NITROFURANTOIN <=16 SENSITIVE Sensitive     TRIMETH/SULFA <=20 SENSITIVE Sensitive     AMPICILLIN/SULBACTAM <=2 SENSITIVE Sensitive     PIP/TAZO <=4 SENSITIVE Sensitive     * >=100,000 COLONIES/mL ESCHERICHIA COLI    [x]  Patient discharged originally without antimicrobial agent and treatment is now indicated  59 yo who came in with ankle swelling that is likely due to erytyhema nodosum from UC. She had some fevers so a UA was sent and looked to be dirty. Going to use a short course of abx.  New antibiotic prescription: Septra DS 1 PO BID x 3 days  ED Provider: Dewitt Hoes, [place]  Onnie Boer, PharmD Pager: [phone] Infectious Diseases Pharmacist Phone# [phone]

## 2015-04-29 ENCOUNTER — Ambulatory Visit
Admission: RE | Admit: 2015-04-29 | Discharge: 2015-04-29 | Disposition: A | Discharge status: Home / Self Care | Payer: 59 | Source: Ambulatory Visit | Attending: Medical | Admitting: Medical

## 2015-04-29 ENCOUNTER — Ambulatory Visit (INDEPENDENT_AMBULATORY_CARE_PROVIDER_SITE_OTHER): Payer: 59 | Admitting: Medical

## 2015-04-29 ENCOUNTER — Telehealth: Payer: Self-pay | Admitting: Internal Medicine

## 2015-04-29 ENCOUNTER — Encounter: Payer: Self-pay | Admitting: Medical

## 2015-04-29 VITALS — BP 118/70 | HR 114 | Temp 99.0°F

## 2015-04-29 DIAGNOSIS — K51819 Other ulcerative colitis with unspecified complications: Secondary | ICD-10-CM | POA: Diagnosis not present

## 2015-04-29 DIAGNOSIS — L52 Erythema nodosum: Secondary | ICD-10-CM | POA: Diagnosis not present

## 2015-04-29 DIAGNOSIS — M2540 Effusion, unspecified joint: Secondary | ICD-10-CM

## 2015-04-29 DIAGNOSIS — R062 Wheezing: Secondary | ICD-10-CM

## 2015-04-29 DIAGNOSIS — R05 Cough: Secondary | ICD-10-CM

## 2015-04-29 DIAGNOSIS — N3000 Acute cystitis without hematuria: Secondary | ICD-10-CM

## 2015-04-29 DIAGNOSIS — R059 Cough, unspecified: Secondary | ICD-10-CM

## 2015-04-29 DIAGNOSIS — K140 Glossitis: Secondary | ICD-10-CM

## 2015-04-29 LAB — BASIC METABOLIC PANEL
BUN: 15 mg/dL (ref 7–25)
CALCIUM: 9.8 mg/dL (ref 8.6–10.4)
CO2: 27 mmol/L (ref 20–31)
CREATININE: 0.53 mg/dL (ref 0.50–1.05)
Chloride: 95 mmol/L — ABNORMAL LOW (ref 98–110)
GLUCOSE: 98 mg/dL (ref 65–99)
Potassium: 3.4 mmol/L — ABNORMAL LOW (ref 3.5–5.3)
Sodium: 136 mmol/L (ref 135–146)

## 2015-04-29 LAB — POCT URINALYSIS DIPSTICK
BILIRUBIN UA: NEGATIVE
Blood, UA: NEGATIVE
GLUCOSE UA: NEGATIVE
LEUKOCYTES UA: NEGATIVE
NITRITE UA: NEGATIVE
PH UA: 6
Protein, UA: 1
Spec Grav, UA: >=1.03
Urobilinogen, UA: 0.2

## 2015-04-29 LAB — URIC ACID: Uric Acid, Serum: 3.7 mg/dL (ref 2.4–7.0)

## 2015-04-29 LAB — RHEUMATOID FACTOR: Rhuematoid fact SerPl-aCnc: <10 IU/mL (ref <=14)

## 2015-04-29 MED ORDER — PREDNISONE 10 MG PO TABS: ORAL_TABLET | ORAL | Status: DC

## 2015-04-29 MED ORDER — TRIAMCINOLONE ACETONIDE 0.1 % MT PSTE: PASTE | OROMUCOSAL | Status: DC

## 2015-04-29 NOTE — Telephone Encounter (Signed)
Spoke to [employer], PA-C  She is having diarrhea w/ bleeding and joint sxs - mouth ulcers  Has been off mesalamine  Advised restart Lialda and also start prednisone 40 mg daily and we will arrange f/u here 1-2 weeks   He will also do some rheum labs re: joints

## 2015-04-29 NOTE — Telephone Encounter (Signed)
Patient is scheduled for follow up with Nicoletta Ba PA on 05/07/15 2:30.  She is notified of the appt date and time

## 2015-04-29 NOTE — Progress Notes (Signed)
Subjective: Chief Complaint  Patient presents with  . Follow-up    swelling in feet. lt foot is still swollen. hands are swollen. obtained urine per dr Koleen Distance request. feet are more painful than her hands. has a cough, had breathing treatment at hospital and did one at home. wednesday temp was 101.    Here for hospital f/u.  She went to the ED at [facility] 04/24/15 for 1 week hx/o lower extremity swelling, warmth nd tenderness to legs, and fever.  She has hx/o ulcerative colitis and asthma.   Had leg pain as well.  Had labs including CBC with mildly elevated WBCs, BMET unremarkable other than slightly low potassium.  Urine culture was + for E coli infection though.  She had negative doppler ultrasound of legs given swelling and pain in legs.  Had breathing treatments while at the ED.  ED physician felt like her leg symptoms were consistent with erythema nodosum likely related to her ulcerative colitis.    Currently doesn't feel well.  Still having some wheezing, coughing up some phlegm.   This has persisted at least a week.  No chest xray was done at the ED visit.  Denies any current urinary c/o, no dysuria, no burning with urination, but maybe having some increased urine frequency.   Swelling started in right foot 2 weeks ago, but now mainly just swelling in the left foot and lower leg.    Hand are swollen today and for the past few days.  Lip was swollen yesterday, nose seemed swollen yesterday.     Last visit to her gastroenterologist was almost a year ago.   Has not been compliant with Lialda.  Hasn't checked temp since leaving the hospital.   But thinks the fevers have improved.    Has bad canker sore in her mouth.   Having joint pains in left wrist and forearm.  Having pain and swelling in both hands along MCPs.   Has had some diarrhea, 3 days worth, 5-6 times per day with blood in stool.     Past Medical History  Diagnosis Date  . Asthma   . Seasonal allergic rhinitis   . Obesity   .  Urinary tract infection 2013  . Chronic headache     many years  . Arthralgia   . Wears glasses   . S/P hysterectomy     due to fibroids  . Family history of premature coronary artery disease     father  . Former smoker     10 pack year history  . Pectus excavatum     mild, left asymmetric  . H/O cardiovascular stress test 2009  . Allergy     SEASONAL  . Anemia   . Left sided ulcerative (chronic) colitis ([place]) 05/18/2014  . Hx of adenomatous and sessile serrated colonic polyps 05/24/2014  . Vitamin D deficiency 07/18/2014   ROS as in subjective   Objective: BP 118/70 mmHg  Pulse 114  Temp(Src) 99 F (37.2 C) (Oral)  SpO2 95%  General appearance: alert, no distress, WD/WN, ill appearing, lying on exam table at times HEENT: normocephalic, sclerae anicteric, TMs pearly, nares patent, no discharge or erythema, pharynx normal Oral cavity: right lateral tongue with 1cm ulceration, otherwise MMM Neck: supple, no lymphadenopathy, no thyromegaly, no masses Heart: RRR, normal S1, S2, no murmurs Lungs: scattered wheezes, reduced breath sounds, no rhonchi, or rales Abdomen: +increased bs, soft, non tender, non distended, no masses, no hepatomegaly, no splenomegaly Pulses: 2+ symmetric, upper  and lower extremities, normal cap refill Swelling along bilat hands over MCP bilat, swelling of left lower leg and foot, swelling over left forearm, tender in same areas as swelling, swelling of right knee mildly No obvious bruising, erythema, rash or other skin findings   Assessment: Encounter Diagnoses  Name Primary?  . Other ulcerative colitis with complication ([place]) Yes  . Joint swelling   . Tongue ulcer   . Wheezing   . Cough   . Acute cystitis without hematuria   . Erythema nodosum     Plan: Initially gave albuterol neb x 1 with some improvement.  She is currently taking Bactrim for UTI.  Reviewed recent ED report, labs, leg ultrasounds.   Discussed case with Dr. Redmond School,  supervising physician.  Also called Dr. Carlean Purl, her gastroenterologist to discussed her case, symptoms, and next steps.      Will send for CXR, labs today.  Begin Prednisone 3m daily.   GI will be calling her for f/u appt.  Advised she c/t albuterol neb q4-6 hours prn.   Rest , hydrate well.  Gave note for work.    F/u pending labs, GI f/u.

## 2015-04-29 NOTE — Telephone Encounter (Signed)
Left message for patient to call back  

## 2015-04-30 ENCOUNTER — Inpatient Hospital Stay: Payer: 59 | Admitting: Medical

## 2015-04-30 LAB — CBC WITH DIFFERENTIAL/PLATELET
Basophils Absolute: 0 10*3/uL (ref 0.0–0.1)
Basophils Relative: 0 % (ref 0–1)
Eosinophils Absolute: 0.1 10*3/uL (ref 0.0–0.7)
Eosinophils Relative: 1 % (ref 0–5)
HEMATOCRIT: 38.6 % (ref 36.0–46.0)
HEMOGLOBIN: 13.1 g/dL (ref 12.0–15.0)
LYMPHS ABS: 2.9 10*3/uL (ref 0.7–4.0)
LYMPHS PCT: 22 % (ref 12–46)
MCH: 26.3 pg (ref 26.0–34.0)
MCHC: 33.9 g/dL (ref 30.0–36.0)
MCV: 77.4 fL — AB (ref 78.0–100.0)
MONOS PCT: 7 % (ref 3–12)
MPV: 9.2 fL (ref 8.6–12.4)
Monocytes Absolute: 0.9 10*3/uL (ref 0.1–1.0)
NEUTROS ABS: 9.1 10*3/uL — AB (ref 1.7–7.7)
NEUTROS PCT: 70 % (ref 43–77)
Platelets: 273 10*3/uL (ref 150–400)
RBC: 4.99 MIL/uL (ref 3.87–5.11)
RDW: 14.7 % (ref 11.5–15.5)
WBC: 13 10*3/uL — ABNORMAL HIGH (ref 4.0–10.5)

## 2015-04-30 LAB — HIGH SENSITIVITY CRP: CRP, High Sensitivity: 129 mg/L — ABNORMAL HIGH

## 2015-04-30 LAB — ANA: ANA: NEGATIVE

## 2015-05-02 MED ORDER — ALBUTEROL SULFATE (2.5 MG/3ML) 0.083% IN NEBU
2.5000 mg | INHALATION_SOLUTION | Freq: Once | RESPIRATORY_TRACT | Status: AC
  Administered 2015-04-28: 2.5 mg via RESPIRATORY_TRACT

## 2015-05-02 NOTE — Addendum Note (Signed)
Addended by: Minette Headland A on: 05/02/2015 03:07 PM   Modules accepted: Orders

## 2015-05-07 ENCOUNTER — Ambulatory Visit (INDEPENDENT_AMBULATORY_CARE_PROVIDER_SITE_OTHER): Payer: 59 | Admitting: Physician Assistant

## 2015-05-07 ENCOUNTER — Encounter: Payer: Self-pay | Admitting: Physician Assistant

## 2015-05-07 VITALS — BP 122/72 | HR 90 | Ht 70.25 in | Wt 212.2 lb

## 2015-05-07 DIAGNOSIS — K121 Other forms of stomatitis: Secondary | ICD-10-CM | POA: Diagnosis not present

## 2015-05-07 DIAGNOSIS — K51811 Other ulcerative colitis with rectal bleeding: Secondary | ICD-10-CM | POA: Diagnosis not present

## 2015-05-07 MED ORDER — MESALAMINE 1.2 G PO TBEC: DELAYED_RELEASE_TABLET | ORAL | Status: DC

## 2015-05-07 MED ORDER — PREDNISONE 10 MG PO TABS: ORAL_TABLET | ORAL | Status: DC

## 2015-05-07 NOTE — Progress Notes (Signed)
Patient ID: Katie Robinson, female   DOB: [birth year], 59 y.o.   MRN: [id number]   Subjective:    Patient ID: Katie Robinson, female    DOB: Dec 29, 1955, 59 y.o.   MRN: [id number]  HPI Katie Robinson is a 59 year old African-American female known to Dr. Carlean Purl. She has left-sided ulcerative colitis and on last colonoscopy in December 2015 had mild-to-moderate left-sided colitis and to polyps were also removed from the left colon one was a tubular adenoma the other a sessile serrated adenoma. Katie Robinson been  on Katie 2.4 g for maintenance had not been taking her medication. She had onset early November with swelling in her right foot and ankle which was quite painful. Following week she developed swelling and erythema of the left foot associated with fever. She was seen and evaluated in the emergency room. 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 She eventually saw her PCP on 11/14 with persistent complaints of joint pains in her ankles and feet and also had developed swelling of both hands and wrists which was painful. Dr. Carlean Purl was consulted and she was started on prednisone 40 mg by mouth daily. She also had labs drawn, a ANA was negative, rheumatoid factor negative ,CRP markedly elevated, at 129, uric acid within normal limits WBC 13, hemoglobin 13, MCV of 77. Chest x-ray was negative. She comes back in today for follow-up. She says she felt terrible for about a week and a half but is now in general feeling better. She's not had any further fever and her joint swelling has resolved. She also had been having an increase in diarrhea and small amounts of blood mixed in with her stools. He is not having any significant abdominal pain. No nausea or vomiting.   She also relates that she's has painful mouth ulcers he has had periodically for several years even when she's not having other symptoms. She has been using a triamcinolone paste which she says helps a little bit.  Review of Systems Pertinent  positive and negative review of systems were noted in the above HPI section.  All other review of systems was otherwise negative.  Outpatient Encounter Prescriptions as of 05/07/2015  Medication Sig  . acetaminophen (TYLENOL) 500 MG tablet Take 500 mg by mouth every 6 (six) hours as needed for moderate pain.  Marland Kitchen albuterol (PROVENTIL HFA;VENTOLIN HFA) 108 (90 BASE) MCG/ACT inhaler Inhale 2 puffs into the lungs every 6 (six) hours as needed for wheezing or shortness of breath.  Marland Kitchen albuterol (PROVENTIL) (2.5 MG/3ML) 0.083% nebulizer solution Take 3 mLs (2.5 mg total) by nebulization every 6 (six) hours as needed for wheezing or shortness of breath.  . meloxicam (MOBIC) 7.5 MG tablet Take 1 tablet (7.5 mg total) by mouth daily.  . mesalamine (Katie) 1.2 G EC tablet Take 4 tablets daily.  . predniSONE (DELTASONE) 10 MG tablet Take 35 mg tomorrow 11-23, then decrease by 5 mg per week until off.  . triamcinolone (KENALOG) 0.1 % paste Apply to tongue ulcer TID  . Vitamin D, Ergocalciferol, (DRISDOL) 50000 UNITS CAPS capsule Take 1 capsule (50,000 Units total) by mouth every 7 (seven) days.  . [DISCONTINUED] mesalamine (Katie) 1.2 G EC tablet Take 4 tablets (4.8 g total) by mouth daily with breakfast.  . [DISCONTINUED] predniSONE (DELTASONE) 10 MG tablet 4 tablets daily  . [DISCONTINUED] sulfamethoxazole-trimethoprim (BACTRIM DS,SEPTRA DS) 800-160 MG tablet Take 1 tablet by mouth 3 (three) times daily.  Marland Kitchen HYDROcodone-acetaminophen (NORCO/VICODIN) 5-325 MG tablet Take 1-2 tablets by  mouth every 4 (four) hours as needed for moderate pain or severe pain. (Patient not taking: Reported on 05/07/2015)   No facility-administered encounter medications on file as of 05/07/2015.   No Known Allergies Patient Active Problem List   Diagnosis Date Noted  . Vitamin D deficiency 07/18/2014  . Hx of adenomatous and sessile serrated colonic polyps 05/24/2014  . Rectal bleeding 05/18/2014  . Left sided ulcerative  (chronic) colitis ([place]) 05/18/2014   Social History   Social History  . Marital Status: Divorced    Spouse Name: N/A  . Number of Children: 2  . Years of Education: N/A   Occupational History  . postal clerk    Social History Main Topics  . Smoking status: Former Smoker    Types: Cigarettes    Quit date: 06/15/1988  . Smokeless tobacco: Never Used  . Alcohol Use: 0.0 oz/week    0 Standard drinks or equivalent per week     Comment: rarely-occasionally  . Drug Use: No  . Sexual Activity: Not on file   Other Topics Concern  . Not on file   Social History Narrative   Lives with her 2 adult children, not married, no exercise, active with walking on the job at the [place] [employer]    Katie Robinson's family history includes Alzheimer's disease in her mother; Aneurysm in her mother; Asthma in her father and sister; Heart disease in her mother; Heart disease (age of onset: 14) in her father; Hypertension in her mother and sister. There is no history of Cancer, Stroke, or Diabetes.      Objective:    Filed Vitals:   05/07/15 1438  BP: 122/72  Pulse: 90    Physical Exam  well-developed older African-American female in no acute distress, pleasant blood pressure 122/72 pulse 90 height 5 foot 10 weight 212 Apple HEENT; nontraumatic EOMI PERRLA sclera anicteric, she does have some aphthous ulcerations along her tongue, cardiovascular; regular rate and rhythm with S1-S2 no murmur or gallop, Pulmonary; clear bilaterally, Abdomen; soft bowel sounds are present mild tenderness in the left lower quadrant there is no guarding or rebound no palpable mass or hepatosplenomegaly, Rectal ;exam not done, Extremities; no current swelling or erythema Neuropsych; mood and affect appropriate      Assessment & Plan:   #1 59 yo female with left sided ulcerative colitis with exacerbation  Also associated with 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

## 2015-05-07 NOTE — Patient Instructions (Signed)
We sent a refill on the Prednisone 10 mg tablets. Take 35 mg , ( 3 1/2 tablets) , tomorrow 05-08-2015, then decrease by 5 mg weekly until off. ( 30 mg, 25 mg, 20 mg, 15 mg, 10 mg, 5 mg )    We also sent refills on the Lialda and have provided a Lialda savings card.   We made you a follow up with Nicoletta Ba PA for 05-30-2015 at 2:30 PM. Calll for any worsening symptoms.

## 2015-05-12 NOTE — Progress Notes (Signed)
Agree with Ms. Esterwood's assessment and plan. Carl E. Gessner, MD, FACG   

## 2015-05-30 ENCOUNTER — Encounter: Payer: Self-pay | Admitting: Physician Assistant

## 2015-05-30 ENCOUNTER — Ambulatory Visit (INDEPENDENT_AMBULATORY_CARE_PROVIDER_SITE_OTHER): Payer: 59 | Admitting: Physician Assistant

## 2015-05-30 ENCOUNTER — Other Ambulatory Visit (INDEPENDENT_AMBULATORY_CARE_PROVIDER_SITE_OTHER): Payer: 59

## 2015-05-30 VITALS — BP 124/70 | HR 100 | Ht 70.25 in | Wt 221.4 lb

## 2015-05-30 DIAGNOSIS — K51918 Ulcerative colitis, unspecified with other complication: Secondary | ICD-10-CM | POA: Diagnosis not present

## 2015-05-30 DIAGNOSIS — M1990 Unspecified osteoarthritis, unspecified site: Secondary | ICD-10-CM

## 2015-05-30 LAB — CBC WITH DIFFERENTIAL/PLATELET
BASOS PCT: 0.8 % (ref 0.0–3.0)
Basophils Absolute: 0.1 10*3/uL (ref 0.0–0.1)
EOS ABS: 0.2 10*3/uL (ref 0.0–0.7)
EOS PCT: 1.5 % (ref 0.0–5.0)
HCT: 37.5 % (ref 36.0–46.0)
HEMOGLOBIN: 11.9 g/dL — AB (ref 12.0–15.0)
Lymphocytes Relative: 32 % (ref 12.0–46.0)
Lymphs Abs: 4.5 10*3/uL — ABNORMAL HIGH (ref 0.7–4.0)
MCHC: 31.9 g/dL (ref 30.0–36.0)
MCV: 80.8 fl (ref 78.0–100.0)
MONO ABS: 1.3 10*3/uL — AB (ref 0.1–1.0)
Monocytes Relative: 9.3 % (ref 3.0–12.0)
NEUTROS PCT: 56.4 % (ref 43.0–77.0)
Neutro Abs: 8 10*3/uL — ABNORMAL HIGH (ref 1.4–7.7)
Platelets: 320 10*3/uL (ref 150.0–400.0)
RBC: 4.64 Mil/uL (ref 3.87–5.11)
RDW: 16.2 % — ABNORMAL HIGH (ref 11.5–15.5)
WBC: 14.2 10*3/uL — ABNORMAL HIGH (ref 4.0–10.5)

## 2015-05-30 LAB — HIGH SENSITIVITY CRP: CRP HIGH SENSITIVITY: 1.16 mg/L (ref 0.000–5.000)

## 2015-05-30 LAB — HEPATIC FUNCTION PANEL
ALT: 10 U/L (ref 0–35)
AST: 10 U/L (ref 0–37)
Albumin: 3.7 g/dL (ref 3.5–5.2)
Alkaline Phosphatase: 90 U/L (ref 39–117)
BILIRUBIN TOTAL: 0.4 mg/dL (ref 0.2–1.2)
Bilirubin, Direct: 0.1 mg/dL (ref 0.0–0.3)
Total Protein: 6.6 g/dL (ref 6.0–8.3)

## 2015-05-30 MED ORDER — MESALAMINE 1.2 G PO TBEC: DELAYED_RELEASE_TABLET | ORAL | Status: DC

## 2015-05-30 NOTE — Progress Notes (Signed)
Patient ID: Katie Robinson, female   DOB: 27-Jul-1955, 59 y.o.   MRN: [id number]   Subjective:    Patient ID: Katie Robinson, female    DOB: 08-04-1955, 59 y.o.   MRN: [id number]  HPI  Katie Robinson is a pleasant 59 year old female known to Dr. Carlean Purl with history of left-sided ulcerative colitis which was diagnosed about a year ago at colonoscopy. Found at that time to have mild left-sided colitis an adenomatous polyp and a sessile serrated adenoma.  She was just recently seen on 05/07/2015 by myself with an exacerbation of colitis primarily manifested with arthropathy. She had swelling in multiple joints eat and ankles hands and wrists. After an ER visit to a NA, rheumatoid factor CRP P were done CRP was markedly elevated at 129 a NA and rheumatoid factor negative. She was started on prednisone 40 mg per day placed back on Lialda which she had stopped taking. She comes in today for follow-up date in that she's feeling much better. All of her arthropathy symptoms have resolved she says her right foot is still a little bit uncomfortable but all of the swelling has resolved. He is currently not having any abdominal pain bowel movements have been normal she's not seeing any blood in her stools. Overall she feels much better. She is taking Lialda as instructed and is down to 15 mg per day of prednisone.  Review of Systems Pertinent positive and negative review of systems were noted in the above HPI section.  All other review of systems was otherwise negative.  Outpatient Encounter Prescriptions as of 05/30/2015  Medication Sig  . acetaminophen (TYLENOL) 500 MG tablet Take 500 mg by mouth every 6 (six) hours as needed for moderate pain.  . mesalamine (LIALDA) 1.2 G EC tablet Take 4 tablets daily.  . predniSONE (DELTASONE) 10 MG tablet Take 35 mg tomorrow 11-23, then decrease by 5 mg per week until off.  . [DISCONTINUED] mesalamine (LIALDA) 1.2 G EC tablet Take 4 tablets daily.  . [DISCONTINUED] mesalamine  (LIALDA) 1.2 G EC tablet Take 4 tablets daily.  Marland Kitchen albuterol (PROVENTIL HFA;VENTOLIN HFA) 108 (90 BASE) MCG/ACT inhaler Inhale 2 puffs into the lungs every 6 (six) hours as needed for wheezing or shortness of breath. (Patient not taking: Reported on 05/30/2015)  . albuterol (PROVENTIL) (2.5 MG/3ML) 0.083% nebulizer solution Take 3 mLs (2.5 mg total) by nebulization every 6 (six) hours as needed for wheezing or shortness of breath. (Patient not taking: Reported on 05/30/2015)  . HYDROcodone-acetaminophen (NORCO/VICODIN) 5-325 MG tablet Take 1-2 tablets by mouth every 4 (four) hours as needed for moderate pain or severe pain. (Patient not taking: Reported on 05/30/2015)  . triamcinolone (KENALOG) 0.1 % paste Apply to tongue ulcer TID (Patient not taking: Reported on 05/30/2015)  . [DISCONTINUED] meloxicam (MOBIC) 7.5 MG tablet Take 1 tablet (7.5 mg total) by mouth daily. (Patient not taking: Reported on 05/30/2015)  . [DISCONTINUED] Vitamin D, Ergocalciferol, (DRISDOL) 50000 UNITS CAPS capsule Take 1 capsule (50,000 Units total) by mouth every 7 (seven) days. (Patient not taking: Reported on 05/30/2015)   No facility-administered encounter medications on file as of 05/30/2015.   No Known Allergies Patient Active Problem List   Diagnosis Date Noted  . Vitamin D deficiency 07/18/2014  . Hx of adenomatous and sessile serrated colonic polyps 05/24/2014  . Rectal bleeding 05/18/2014  . Left sided ulcerative (chronic) colitis ([place]) 05/18/2014   Social History   Social History  . Marital Status: Divorced    Spouse Name:  N/A  . Number of Children: 2  . Years of Education: N/A   Occupational History  . postal clerk    Social History Main Topics  . Smoking status: Former Smoker    Types: Cigarettes    Quit date: 06/15/1988  . Smokeless tobacco: Never Used  . Alcohol Use: 0.0 oz/week    0 Standard drinks or equivalent per week     Comment: rarely-occasionally  . Drug Use: No  . Sexual  Activity: Not on file   Other Topics Concern  . Not on file   Social History Narrative   Lives with her 2 adult children, not married, no exercise, active with walking on the job at the [place] [employer]    Ms. Arenas's family history includes Alzheimer's disease in her mother; Aneurysm in her mother; Asthma in her father and sister; Heart disease in her mother; Heart disease (age of onset: 31) in her father; Hypertension in her mother and sister. There is no history of Cancer, Stroke, or Diabetes.      Objective:    Filed Vitals:   05/30/15 1433  BP: 124/70  Pulse: 100    Physical Exam   Well-developed African-American female in no acute distress, pleasant blood pressure 124/70 pulse 100 height 5 foot 10 weight 221. HEENT; nontraumatic, cephalic EOMI PERRLA sclera anicteric, Cardiovascular; regular rate and rhythm with S1-S2 no murmur or gallop, Pulmonary ;clear bilaterally, Abdomen ;soft basically nontender, nondistended ,bowel sounds are active there is no palpable mass or hepatosplenomegaly, Rectal ;exam not done, Extremities; no clubbing cyanosis or edema skin warm and dry no obvious joint inflammation or swelling, Neuropsych; mood and affect appropriate       Assessment & Plan:   #1 59 yo female with left sided ulcerative colitis with recent exacerbation associated  with significant multi-joint arthropathy- much improved #2 Hx adenomatous and serrated adenomatous polyp- due for Follow up 2020 #3 vIT  D def  Plan;  Continue Lialda 4.8 g daily. We discussed importance of maintaining maintenance medication for good control of her disease.  Continue to decrease prednisone by 5 mg per week until discontinued.  Check CBC with differential hepatic panel and CRP today.  Follow-up with Dr. Carlean Purl in 4-6 months. She knows to call in the interim should she have any exacerbation of symptoms.   Amy Genia Harold PA-C 05/30/2015   Cc: Carlena Hurl, PA-C

## 2015-05-30 NOTE — Patient Instructions (Addendum)
Please go to the basement level to have your labs drawn.  We sent a presccription to [facility] for Lialda 1.2 g.  # 360 pills with 3 refills.  Continue to wean off the Prednisone by 37m per week. Follow up with Dr. CSilvano Ruskin 4-6 months.

## 2015-06-02 NOTE — Progress Notes (Signed)
Agree with Ms. Esterwood's assessment and plan. Carl E. Gessner, MD, FACG   

## 2015-08-21 ENCOUNTER — Other Ambulatory Visit: Payer: Self-pay | Admitting: Medical

## 2015-08-21 NOTE — Telephone Encounter (Signed)
Is this ok to refill?  

## 2015-11-14 DIAGNOSIS — Z9289 Personal history of other medical treatment: Secondary | ICD-10-CM

## 2015-11-14 HISTORY — DX: Personal history of other medical treatment: Z92.89

## 2015-11-26 ENCOUNTER — Ambulatory Visit (INDEPENDENT_AMBULATORY_CARE_PROVIDER_SITE_OTHER): Payer: 59 | Admitting: Internal Medicine

## 2015-11-26 ENCOUNTER — Other Ambulatory Visit (INDEPENDENT_AMBULATORY_CARE_PROVIDER_SITE_OTHER): Payer: 59

## 2015-11-26 ENCOUNTER — Encounter: Payer: Self-pay | Admitting: Internal Medicine

## 2015-11-26 VITALS — BP 128/78 | HR 96 | Ht 70.25 in | Wt 228.6 lb

## 2015-11-26 DIAGNOSIS — D649 Anemia, unspecified: Secondary | ICD-10-CM

## 2015-11-26 DIAGNOSIS — Z23 Encounter for immunization: Secondary | ICD-10-CM

## 2015-11-26 DIAGNOSIS — M129 Arthropathy, unspecified: Secondary | ICD-10-CM | POA: Diagnosis not present

## 2015-11-26 DIAGNOSIS — E559 Vitamin D deficiency, unspecified: Secondary | ICD-10-CM

## 2015-11-26 DIAGNOSIS — K5150 Left sided colitis without complications: Secondary | ICD-10-CM

## 2015-11-26 DIAGNOSIS — K529 Noninfective gastroenteritis and colitis, unspecified: Secondary | ICD-10-CM

## 2015-11-26 LAB — FERRITIN: Ferritin: 10.6 ng/mL (ref 10.0–291.0)

## 2015-11-26 LAB — CBC WITH DIFFERENTIAL/PLATELET
BASOS PCT: 0.6 % (ref 0.0–3.0)
Basophils Absolute: 0.1 10*3/uL (ref 0.0–0.1)
EOS PCT: 6.3 % — AB (ref 0.0–5.0)
Eosinophils Absolute: 0.5 10*3/uL (ref 0.0–0.7)
HCT: 39.6 % (ref 36.0–46.0)
Hemoglobin: 13.1 g/dL (ref 12.0–15.0)
LYMPHS ABS: 3.1 10*3/uL (ref 0.7–4.0)
Lymphocytes Relative: 35.3 % (ref 12.0–46.0)
MCHC: 33 g/dL (ref 30.0–36.0)
MCV: 82.7 fl (ref 78.0–100.0)
MONO ABS: 0.8 10*3/uL (ref 0.1–1.0)
MONOS PCT: 8.8 % (ref 3.0–12.0)
NEUTROS ABS: 4.3 10*3/uL (ref 1.4–7.7)
NEUTROS PCT: 49 % (ref 43.0–77.0)
PLATELETS: 291 10*3/uL (ref 150.0–400.0)
RBC: 4.79 Mil/uL (ref 3.87–5.11)
RDW: 15.3 % (ref 11.5–15.5)
WBC: 8.7 10*3/uL (ref 4.0–10.5)

## 2015-11-26 LAB — VITAMIN D 25 HYDROXY (VIT D DEFICIENCY, FRACTURES): VITD: 14.86 ng/mL — ABNORMAL LOW (ref 30.00–100.00)

## 2015-11-26 NOTE — Progress Notes (Signed)
   Subjective:    Patient ID: Katie Robinson, female    DOB: [birth year], 60 y.o.   MRN: [id number] Cc: f/u ulcerative colitis HPI Here for f/u after a visit to our office with a flare of diarrhea and also arthritis sxs. She had stopped Lialda. Since restarting all GI sxs and arthritis sxs under control.  Medications, allergies, past medical history, past surgical history, family history and social history are reviewed and updated in the EMR.  Review of Systems As above    Objective:   Physical Exam BP 128/78 mmHg  Pulse 96  Ht 5' 10.25" (1.784 m)  Wt 228 lb 9.6 oz (103.692 kg)  BMI 32.58 kg/m2 NAD Feet and hands w/o joint inflammation or edema Alert and oriented x 3  Reviewed 07/2015 note with PA    Assessment & Plan:   Encounter Diagnoses  Name Primary?  . Left sided ulcerative colitis, without complications ([place]) Yes  . Vitamin D deficiency   . Arthropathy associated with inflammatory bowel disease   . Mild anemia    She is improved and presumably back in clinical remission. Needs evaluation with labs today as below.  CBC, ferritin vit D  DEXA Prevnar vaccine now and should get a Pneumovax in 1 year RTC 1 year sooner prn  I appreciate the opportunity to care for this patient.  BS:[id number], DAVID SHANE, PA-C

## 2015-11-26 NOTE — Patient Instructions (Addendum)
Today you have been given a prevnar 13 vaccine, handout provided.   Normal BMI (Body Mass Index- based on height and weight) is between 19 and 25. Your BMI today is Body mass index is 32.58 kg/(m^2). Marland Kitchen Please consider follow up  regarding your BMI with your Primary Care Provider.   Your physician has requested that you go to the basement for the following lab work before leaving today: CBC, Ferritin, Vitamin D    You have been scheduled for a bone density test on 12/02/15 at 2:00pm. Please go to radiology on the basement floor of [place] for this test. No preparation other than no metal please.   I appreciate the opportunity to care for you. Silvano Rusk, MD, [facility]

## 2015-11-27 ENCOUNTER — Encounter: Payer: Self-pay | Admitting: Internal Medicine

## 2015-11-29 ENCOUNTER — Encounter: Payer: Self-pay | Admitting: Internal Medicine

## 2015-11-29 DIAGNOSIS — E611 Iron deficiency: Secondary | ICD-10-CM

## 2015-11-29 HISTORY — DX: Iron deficiency: E61.1

## 2015-11-29 NOTE — Progress Notes (Signed)
Quick Note:  Vit D low - vit D 50K U weekly x 12 1000 U daily also  Iron low - ferrous sulfate 325 mg bid OTC - will make stools dark  CBC, ferritin and vit D level in 4 months dx iron def and vit D def ______

## 2015-12-02 ENCOUNTER — Ambulatory Visit (INDEPENDENT_AMBULATORY_CARE_PROVIDER_SITE_OTHER)
Admission: RE | Admit: 2015-12-02 | Discharge: 2015-12-02 | Disposition: A | Discharge status: Home / Self Care | Payer: 59 | Source: Ambulatory Visit | Attending: Internal Medicine | Admitting: Internal Medicine

## 2015-12-02 ENCOUNTER — Telehealth: Payer: Self-pay

## 2015-12-02 DIAGNOSIS — K5150 Left sided colitis without complications: Secondary | ICD-10-CM

## 2015-12-02 DIAGNOSIS — M129 Arthropathy, unspecified: Secondary | ICD-10-CM | POA: Diagnosis not present

## 2015-12-02 DIAGNOSIS — E559 Vitamin D deficiency, unspecified: Secondary | ICD-10-CM

## 2015-12-02 DIAGNOSIS — K529 Noninfective gastroenteritis and colitis, unspecified: Secondary | ICD-10-CM

## 2015-12-02 MED ORDER — VITAMIN D (ERGOCALCIFEROL) 1.25 MG (50000 UNIT) PO CAPS: 50000.0000 [IU] | ORAL_CAPSULE | ORAL | Status: DC

## 2015-12-02 NOTE — Telephone Encounter (Signed)
Pharmacist called and i gave him verbally rx's for Vitamin D 50,000IU weekly and told him the OTC meds she needs:  Vitamin D 1000IU, Ferrous Sulfate 359m to take twice a day.

## 2015-12-18 NOTE — Progress Notes (Signed)
Quick Note:  Bone density is normal! Repeat in 2 years See me 1 year as planned or sooner prn ______

## 2016-01-22 ENCOUNTER — Telehealth: Payer: Self-pay | Admitting: Internal Medicine

## 2016-01-22 MED ORDER — PREDNISONE 10 MG PO TABS: 40.0000 mg | ORAL_TABLET | Freq: Every day | ORAL | 1 refills | Status: DC

## 2016-01-22 NOTE — Telephone Encounter (Signed)
I spoke to her - sounds like she is having a flare She can stop ferrous sulfate at this time though doubt that is causing issues  Will start prednisone taper and she will need an appointment with me in September - can work in somewhere to see me in 4-6 weeks I hope - let me know if no spots and we will make one  I rxed prednisone

## 2016-01-22 NOTE — Telephone Encounter (Signed)
Pt states she sees Dr. Darnell Level for UC. She takes Lialda 4 pills daily and was doing well. States she started taking Vit D and Iron and she is not feeling good. Reports since starting them she has been having loose, diarrhea stools, urgency and frequency with stools, BRB in stool, and a foul odor. Pt wants to know what she should do. Please advise.

## 2016-01-23 NOTE — Telephone Encounter (Signed)
Appointment made for 02/24/16 at 3:00pm.

## 2016-01-23 NOTE — Telephone Encounter (Signed)
Left message on her mobile # to call me back.  Need to book her an appointment and confirm home #, the one we have says disconnected when you call it.

## 2016-02-14 HISTORY — PX: COLONOSCOPY: SHX174

## 2016-02-24 ENCOUNTER — Encounter (INDEPENDENT_AMBULATORY_CARE_PROVIDER_SITE_OTHER): Payer: Self-pay

## 2016-02-24 ENCOUNTER — Other Ambulatory Visit (INDEPENDENT_AMBULATORY_CARE_PROVIDER_SITE_OTHER): Payer: 59

## 2016-02-24 ENCOUNTER — Ambulatory Visit (INDEPENDENT_AMBULATORY_CARE_PROVIDER_SITE_OTHER): Payer: 59 | Admitting: Internal Medicine

## 2016-02-24 ENCOUNTER — Encounter: Payer: Self-pay | Admitting: Internal Medicine

## 2016-02-24 VITALS — BP 132/88 | HR 64 | Ht 70.25 in | Wt 238.6 lb

## 2016-02-24 DIAGNOSIS — K625 Hemorrhage of anus and rectum: Secondary | ICD-10-CM | POA: Diagnosis not present

## 2016-02-24 DIAGNOSIS — E611 Iron deficiency: Secondary | ICD-10-CM | POA: Diagnosis not present

## 2016-02-24 DIAGNOSIS — E559 Vitamin D deficiency, unspecified: Secondary | ICD-10-CM

## 2016-02-24 DIAGNOSIS — R197 Diarrhea, unspecified: Secondary | ICD-10-CM

## 2016-02-24 DIAGNOSIS — K5150 Left sided colitis without complications: Secondary | ICD-10-CM | POA: Diagnosis not present

## 2016-02-24 DIAGNOSIS — D649 Anemia, unspecified: Secondary | ICD-10-CM

## 2016-02-24 LAB — CBC WITH DIFFERENTIAL/PLATELET
BASOS PCT: 0.2 % (ref 0.0–3.0)
Basophils Absolute: 0 10*3/uL (ref 0.0–0.1)
EOS ABS: 0.1 10*3/uL (ref 0.0–0.7)
EOS PCT: 0.7 % (ref 0.0–5.0)
HCT: 40.3 % (ref 36.0–46.0)
HEMOGLOBIN: 13.7 g/dL (ref 12.0–15.0)
LYMPHS ABS: 1.4 10*3/uL (ref 0.7–4.0)
Lymphocytes Relative: 13.7 % (ref 12.0–46.0)
MCHC: 33.9 g/dL (ref 30.0–36.0)
MCV: 83.5 fl (ref 78.0–100.0)
MONO ABS: 0.4 10*3/uL (ref 0.1–1.0)
Monocytes Relative: 3.7 % (ref 3.0–12.0)
NEUTROS ABS: 8.6 10*3/uL — AB (ref 1.4–7.7)
Neutrophils Relative %: 81.7 % — ABNORMAL HIGH (ref 43.0–77.0)
PLATELETS: 306 10*3/uL (ref 150.0–400.0)
RBC: 4.82 Mil/uL (ref 3.87–5.11)
RDW: 14.5 % (ref 11.5–15.5)
WBC: 10.6 10*3/uL — ABNORMAL HIGH (ref 4.0–10.5)

## 2016-02-24 LAB — FERRITIN: FERRITIN: 20.3 ng/mL (ref 10.0–291.0)

## 2016-02-24 MED ORDER — PREDNISONE 10 MG PO TABS: 20.0000 mg | ORAL_TABLET | Freq: Every day | ORAL | 1 refills | Status: DC

## 2016-02-24 MED ORDER — DIPHENOXYLATE-ATROPINE 2.5-0.025 MG PO TABS: 1.0000 | ORAL_TABLET | Freq: Four times a day (QID) | ORAL | 0 refills | Status: DC | PRN

## 2016-02-24 NOTE — Patient Instructions (Addendum)
  Your physician has requested that you go to the basement for the following lab work before leaving today: C-Diff stool test, other labs that were previously ordered    We have sent the following medications to your pharmacy for you to pick up at your convenience: Lomotil, prednisone     I appreciate the opportunity to care for you. Silvano Rusk, MD, [facility]

## 2016-02-24 NOTE — Progress Notes (Signed)
   Katie Robinson 60 y.o. 1955/10/16 [id number]  Assessment & Plan:   1. Watery diarrhea   2. Rectal bleeding   3. Left sided ulcerative (chronic) colitis ([place])   4. Iron deficiency   5. Vitamin D deficiency     Differential diagnosis here is possible C. difficile or other infection may be, and flaring of ulcerative colitis. C Diff PCR be checked. If + treat if neg flex sig vs colonoscopy , may need asked department to do because I'm about to go on vacation  CBC, ferritin, vit D level be rechecked today  Lomotil qid prn #360 no refill  Stay at 20 mg prednisone for now If this is IBD then probably need to contemplate biologic therapy versus immunomodulators.   Subjective:   Chief Complaint: Bloody diarhea HPI The patient is here for follow-up. She had called about a month ago with watery bloody diarrhea and I thought this was probably a flare of her ulcerative colitis so we started prednisone. She was on a 10 mg per day weekly taper but she has not really had any response having several watery bloody bowel movements a day with some left lower quadrant cramping. His own is always worked. She denies any recent antibiotics last one she has were last year she thinks. He has been on Lialda and as best I can tell it hasn't caused diarrhea. She has struggled with vitamin D deficiency and she's been treated for the second time with 50,000 units weekly. She's had about 12 doses of that. She works at night, in general the diarrhea is not disturbing her sleep during the day. Medications, allergies, past medical history, past surgical history, family history and social history are reviewed and updated in the EMR.  Review of Systems As above  Objective:   Physical Exam BP 132/88 (BP Location: Left Arm, Patient Position: Sitting, Cuff Size: Normal)   Pulse 64   Ht 5' 10.25" (1.784 m)   Wt 238 lb 9.6 oz (108.2 kg)   BMI 33.99 kg/m  NAD abd soft and NT Eyes are anicteric He is alert  and oriented 3 Mood is appropriate as his affect

## 2016-02-25 ENCOUNTER — Other Ambulatory Visit: Payer: Self-pay

## 2016-02-25 DIAGNOSIS — K5120 Ulcerative (chronic) proctitis without complications: Secondary | ICD-10-CM

## 2016-02-25 LAB — VITAMIN D 25 HYDROXY (VIT D DEFICIENCY, FRACTURES): VITD: 26.51 ng/mL — AB (ref 30.00–100.00)

## 2016-02-25 LAB — CLOSTRIDIUM DIFFICILE BY PCR: CDIFFPCR: NOT DETECTED

## 2016-02-25 NOTE — Progress Notes (Signed)
Am concerned CBC and vit D not done - please check

## 2016-02-25 NOTE — Progress Notes (Signed)
C diff negative Iron and vit D and Hgb better Any chance she can do a colonoscopy Thursday in 330 slot? - dx Left UC

## 2016-02-26 ENCOUNTER — Ambulatory Visit (AMBULATORY_SURGERY_CENTER): Payer: Self-pay | Admitting: *Deleted

## 2016-02-26 VITALS — Ht 72.0 in | Wt 236.0 lb

## 2016-02-26 DIAGNOSIS — K51211 Ulcerative (chronic) proctitis with rectal bleeding: Secondary | ICD-10-CM

## 2016-02-26 NOTE — Progress Notes (Signed)
No egg or soy allergy known to patient  No issues with past sedation with any surgeries  or procedures, no intubation problems  No diet pills per patient No home 02 use per patient  No blood thinners per patient  Pt denies issues with constipation  No A fib or A flutter   Pt had OV with Carlean Purl 02-24-2016

## 2016-02-27 ENCOUNTER — Encounter: Payer: Self-pay | Admitting: Internal Medicine

## 2016-02-27 ENCOUNTER — Ambulatory Visit (AMBULATORY_SURGERY_CENTER): Payer: 59 | Admitting: Internal Medicine

## 2016-02-27 ENCOUNTER — Encounter: Payer: Self-pay | Admitting: *Deleted

## 2016-02-27 ENCOUNTER — Other Ambulatory Visit: Payer: 59

## 2016-02-27 VITALS — BP 127/67 | HR 78 | Temp 98.6°F | Resp 26 | Ht 72.0 in | Wt 236.0 lb

## 2016-02-27 DIAGNOSIS — K625 Hemorrhage of anus and rectum: Secondary | ICD-10-CM | POA: Diagnosis present

## 2016-02-27 DIAGNOSIS — K51311 Ulcerative (chronic) rectosigmoiditis with rectal bleeding: Secondary | ICD-10-CM | POA: Diagnosis not present

## 2016-02-27 DIAGNOSIS — Z7960 Long term (current) use of unspecified immunomodulators and immunosuppressants: Secondary | ICD-10-CM

## 2016-02-27 DIAGNOSIS — Z79899 Other long term (current) drug therapy: Secondary | ICD-10-CM

## 2016-02-27 DIAGNOSIS — K529 Noninfective gastroenteritis and colitis, unspecified: Secondary | ICD-10-CM

## 2016-02-27 MED ORDER — PREDNISONE 10 MG PO TABS: 40.0000 mg | ORAL_TABLET | Freq: Every day | ORAL | 1 refills | Status: DC

## 2016-02-27 MED ORDER — VITAMIN D (ERGOCALCIFEROL) 1.25 MG (50000 UNIT) PO CAPS: 50000.0000 [IU] | ORAL_CAPSULE | ORAL | 0 refills | Status: DC

## 2016-02-27 MED ORDER — HYDROCORTISONE 100 MG/60ML RE ENEM: 1.0000 | ENEMA | Freq: Every day | RECTAL | 1 refills | Status: DC

## 2016-02-27 MED ORDER — SODIUM CHLORIDE 0.9 % IV SOLN: 500.0000 mL | INTRAVENOUS | Status: DC

## 2016-02-27 NOTE — Op Note (Signed)
[place] Patient Name: Katie Robinson Procedure Date: 02/27/2016 3:34 PM MRN: [id number] Endoscopist: Gatha Mayer , MD Age: 60 Referring MD:  Date of Birth: August 03, 1955 Gender: Female Account #: [id number] Procedure:                Colonoscopy Indications:              Left-sided chronic ulcerative colitis, Follow-up of                            left-sided chronic ulcerative colitis, Disease                            activity assessment of left-sided chronic                            ulcerative colitis Medicines:                Propofol per Anesthesia, Monitored Anesthesia Care Procedure:                Pre-Anesthesia Assessment:                           - Prior to the procedure, a History and Physical                            was performed, and patient medications and                            allergies were reviewed. The patient's tolerance of                            previous anesthesia was also reviewed. The risks                            and benefits of the procedure and the sedation                            options and risks were discussed with the patient.                            All questions were answered, and informed consent                            was obtained. Prior Anticoagulants: The patient has                            taken no previous anticoagulant or antiplatelet                            agents. ASA Grade Assessment: II - A patient with                            mild systemic disease. After reviewing the risks  and benefits, the patient was deemed in                            satisfactory condition to undergo the procedure.                           After obtaining informed consent, the colonoscope                            was passed under direct vision. Throughout the                            procedure, the patient's blood pressure, pulse, and                            oxygen saturations were  monitored continuously. The                            Model PCF-H190L [id number]) scope was introduced                            through the anus and advanced to the the cecum,                            identified by appendiceal orifice and ileocecal                            valve. The colonoscopy was performed without                            difficulty. The patient tolerated the procedure                            well. The quality of the bowel preparation was                            good. The ileocecal valve, appendiceal orifice, and                            rectum were photographed. Scope In: 3:41:26 PM Scope Out: 3:53:46 PM Scope Withdrawal Time: 0 hours 5 minutes 40 seconds  Total Procedure Duration: 0 hours 12 minutes 20 seconds  Findings:                 The perianal and digital rectal examinations were                            normal.                           Inflammation characterized by altered vascularity,                            congestion (edema), erosions, friability,  granularity, loss of vascularity, mucus and                            aphthous ulcerations was found in a continuous and                            circumferential pattern from the rectum to the                            sigmoid colon. The descending colon, the splenic                            flexure, the transverse colon, the hepatic flexure,                            the ascending colon, the cecum and the appendiceal                            orifice were spared. This was moderate in severity,                            and when compared to previous examinations, the                            findings are worsened. Biopsies were taken with a                            cold forceps for histology. Verification of patient                            identification for the specimen was done. Estimated                            blood loss was minimal.                            The exam was otherwise without abnormality. Complications:            No immediate complications. Estimated Blood Loss:     Estimated blood loss was minimal. Impression:               - Proctosigmoid ulcerative colitis. Inflammation                            was found from the rectum to the sigmoid colon.                            This was moderate in severity. The findings are                            worsened compared to previous examinations.                            Biopsied.                           -  The examination was otherwise normal. Recommendation:           - Patient has a contact number available for                            emergencies. The signs and symptoms of potential                            delayed complications were discussed with the                            patient. Return to normal activities tomorrow.                            Written discharge instructions were provided to the                            patient.                           - Resume previous diet.                           - Continue present medications.                           - Repeat colonoscopy is recommended. The                            colonoscopy date will be determined after pathology                            results from today's exam become available for                            review.                           - Use Cortenema 1 per rectum daily for 2 weeks.                           - Use prednisone 40 mg PO once a day.                           -                           Suspect she will need biologics - checking                            quantiferon and hepatitis B screening studies Gatha Mayer, MD 02/27/2016 4:12:37 PM This report has been signed electronically.

## 2016-02-27 NOTE — Progress Notes (Signed)
Called to room to assist during endoscopic procedure.  Patient ID and intended procedure confirmed with present staff. Received instructions for my participation in the procedure from the performing physician.  

## 2016-02-27 NOTE — Patient Instructions (Addendum)
The end of the colon and rectum are significantly inflamed. It looks like the ulcerative colitis. I took biopsies to investigate for possible viral infection though that is unlikely.  Keep using the prednisone and Lomotil for now. Go back to 40 mg prednisone. I am also prescribing an enema treatment to see if that helps. It will get the medicine right to the colon. [facility]  I think you will need to start biologic treatment (Remicade, Humira, Entyvio). Immunomodulator treatment (azathioprine, 6-mercaptopurine) may also be used.  I also refilled vitamin D Rx through mail order pharmacy.  I am ordering some blood tests as part of the treatment evaluation process.  Will call with results and plans. I appreciate the opportunity to care for you. Gatha Mayer, MD, FACG  YOU HAD AN ENDOSCOPIC PROCEDURE TODAY AT [facility]:   Refer to the procedure report that was given to you for any specific questions about what was found during the examination.  If the procedure report does not answer your questions, please call your gastroenterologist to clarify.  If you requested that your care partner not be given the details of your procedure findings, then the procedure report has been included in a sealed envelope for you to review at your convenience later.  YOU SHOULD EXPECT: Some feelings of bloating in the abdomen. Passage of more gas than usual.  Walking can help get rid of the air that was put into your GI tract during the procedure and reduce the bloating. If you had a lower endoscopy (such as a colonoscopy or flexible sigmoidoscopy) you may notice spotting of blood in your stool or on the toilet paper. If you underwent a bowel prep for your procedure, you may not have a normal bowel movement for a few days.  Please Note:  You might notice some irritation and congestion in your nose or some drainage.  This is from the oxygen used during your procedure.  There is no need  for concern and it should clear up in a day or so.  SYMPTOMS TO REPORT IMMEDIATELY:   Following lower endoscopy (colonoscopy or flexible sigmoidoscopy):  Excessive amounts of blood in the stool  Significant tenderness or worsening of abdominal pains  Swelling of the abdomen that is new, acute  Fever of 100F or higher  For urgent or emergent issues, a gastroenterologist can be reached at any hour by calling [phone].   DIET:  We do recommend a small meal at first, but then you may proceed to your regular diet.  Drink plenty of fluids but you should avoid alcoholic beverages for 24 hours.  ACTIVITY:  You should plan to take it easy for the rest of today and you should NOT DRIVE or use heavy machinery until tomorrow (because of the sedation medicines used during the test).    FOLLOW UP: Our staff will call the number listed on your records the next business day following your procedure to check on you and address any questions or concerns that you may have regarding the information given to you following your procedure. If we do not reach you, we will leave a message.  However, if you are feeling well and you are not experiencing any problems, there is no need to return our call.  We will assume that you have returned to your regular daily activities without incident.  If any biopsies were taken you will be contacted by phone or by letter within the next 1-3  weeks.  Please call us at [phone] if you have not heard about the biopsies in 3 weeks.    SIGNATURES/CONFIDENTIALITY: You and/or your care partner have signed paperwork which will be entered into your electronic medical record.  These signatures attest to the fact that that the information above on your After Visit Summary has been reviewed and is understood.  Full responsibility of the confidentiality of this discharge information lies with you and/or your care-partner.

## 2016-02-27 NOTE — Progress Notes (Signed)
TO PACU  Awake and alert  Report to RN

## 2016-02-28 ENCOUNTER — Telehealth: Payer: Self-pay

## 2016-02-28 LAB — HEPATITIS B SURFACE ANTIGEN: Hepatitis B Surface Ag: NEGATIVE

## 2016-02-28 LAB — HEPATITIS B CORE ANTIBODY, TOTAL: Hep B Core Total Ab: NONREACTIVE

## 2016-02-28 NOTE — Telephone Encounter (Signed)
  Follow up Call-  Call back number 02/27/2016 05/21/2014  Post procedure Call Back phone  # [phone]  Permission to leave phone message Yes Yes  Some recent data might be hidden     Patient questions:  Do you have a fever, pain , or abdominal swelling? No. Pain Score  0 *  Have you tolerated food without any problems? Yes.    Have you been able to return to your normal activities? Yes.    Do you have any questions about your discharge instructions: Diet   No. Medications  No. Follow up visit  No.  Do you have questions or concerns about your Care? No.  Actions: * If pain score is 4 or above: No action needed, pain <4.

## 2016-02-29 LAB — QUANTIFERON TB GOLD ASSAY (BLOOD)
INTERFERON GAMMA RELEASE ASSAY: NEGATIVE
MITOGEN-NIL SO: 8.98 [IU]/mL
QUANTIFERON NIL VALUE: 0.04 [IU]/mL
Quantiferon Tb Ag Minus Nil Value: 0 IU/mL

## 2016-03-04 LAB — THIOPURINE METHYLTRANSFERASE (TPMT), RBC

## 2016-03-04 NOTE — Progress Notes (Signed)
Being called from office No letter 1 year colon recall

## 2016-03-04 NOTE — Progress Notes (Signed)
Still need thiopurine methyltransferase level TB and Hep B tests negative Please have lab fix this Let her know colon bxs show IBD not infection Is she any better? Started cortenema  Anticipating biologics vs immunomodulators vs both

## 2016-03-05 ENCOUNTER — Other Ambulatory Visit: Payer: 59

## 2016-03-05 DIAGNOSIS — K529 Noninfective gastroenteritis and colitis, unspecified: Secondary | ICD-10-CM

## 2016-03-05 DIAGNOSIS — Z79899 Other long term (current) drug therapy: Secondary | ICD-10-CM

## 2016-03-06 NOTE — Progress Notes (Signed)
OK Let's start Rx for Humira if she is ok with that I have mentioned and can explain more but biologics next step with possible use of 6 MP also pending the lab results I can call her and explain more next week

## 2016-03-15 LAB — THIOPURINE METHYLTRANSFERASE (TPMT), RBC: THIOPURINE METHYLTRANSFERASE, RBC: 9 nmol/h/mL — AB

## 2016-03-18 ENCOUNTER — Telehealth: Payer: Self-pay | Admitting: Internal Medicine

## 2016-03-18 DIAGNOSIS — D84821 Immunodeficiency due to drugs: Secondary | ICD-10-CM

## 2016-03-18 DIAGNOSIS — Z79899 Other long term (current) drug therapy: Secondary | ICD-10-CM

## 2016-03-18 NOTE — Telephone Encounter (Signed)
I called her and reviewed lab results, treatment options with 6MP and biologics and potential side effects, etc Quantiferon and HBSAg are negative  Plan is:  1) Stop Lialda (?/ contributing to diarrhea plus not working) 2) Start 6 MP 25 mg qd disp # 15 50 mg tabs take 1/2 each day 2 RF 3) Humira every 2 weeks - starter pack then 40 mg every 2 weeks 4) Continue Lomotil refill prn 5) CBC and CMET 1 week 6) Will try to reduce prednisone soon 7) See me in early November approx call back sooner prn

## 2016-03-18 NOTE — Telephone Encounter (Signed)
Please review labs and advise TPMT

## 2016-03-19 MED ORDER — ADALIMUMAB 40 MG/0.8ML ~~LOC~~ AJKT: 160.0000 mg | AUTO-INJECTOR | Freq: Once | SUBCUTANEOUS | 0 refills | Status: DC

## 2016-03-19 MED ORDER — ADALIMUMAB 40 MG/0.8ML ~~LOC~~ PSKT: 40.0000 mg | PREFILLED_SYRINGE | SUBCUTANEOUS | 6 refills | Status: DC

## 2016-03-19 MED ORDER — MERCAPTOPURINE 50 MG PO TABS: 25.0000 mg | ORAL_TABLET | Freq: Every day | ORAL | 2 refills | Status: DC

## 2016-03-19 MED ORDER — DIPHENOXYLATE-ATROPINE 2.5-0.025 MG PO TABS: 1.0000 | ORAL_TABLET | Freq: Four times a day (QID) | ORAL | 2 refills | Status: DC | PRN

## 2016-03-19 NOTE — Telephone Encounter (Signed)
Patient notified of the recommendations [facility]  Patient is notified that she will hear from [facility] in a week or so. She understands to come for labs next week.

## 2016-03-20 ENCOUNTER — Telehealth: Payer: Self-pay | Admitting: Internal Medicine

## 2016-03-23 NOTE — Telephone Encounter (Signed)
Noted.  Has been approved by her insurance until 06/18/2016

## 2016-03-24 ENCOUNTER — Telehealth: Payer: Self-pay

## 2016-03-24 MED ORDER — ADALIMUMAB 40 MG/0.8ML ~~LOC~~ PSKT: 40.0000 mg | PREFILLED_SYRINGE | SUBCUTANEOUS | 6 refills | Status: DC

## 2016-03-24 MED ORDER — ADALIMUMAB 40 MG/0.8ML ~~LOC~~ AJKT: 160.0000 mg | AUTO-INJECTOR | Freq: Once | SUBCUTANEOUS | 0 refills | Status: DC

## 2016-03-24 NOTE — Telephone Encounter (Signed)
Patient notified that Humira has been approved.  She is advised I will send it to [facility].  She is notified that she will also get a call from [facility] to help teach her to self inject

## 2016-03-25 ENCOUNTER — Telehealth: Payer: Self-pay | Admitting: Internal Medicine

## 2016-03-25 NOTE — Telephone Encounter (Signed)
Patient wants to return to work next [employer].  Letter created and placed out front for her to pick up

## 2016-03-25 NOTE — Telephone Encounter (Signed)
Yes - ok  Ask her when she thinks she can return and write her a letter for that please

## 2016-03-25 NOTE — Telephone Encounter (Signed)
Is this ok?

## 2016-04-01 ENCOUNTER — Telehealth: Payer: Self-pay | Admitting: Internal Medicine

## 2016-04-01 DIAGNOSIS — K51818 Other ulcerative colitis with other complication: Secondary | ICD-10-CM

## 2016-04-01 NOTE — Telephone Encounter (Signed)
See question from patient and advise

## 2016-04-01 NOTE — Telephone Encounter (Signed)
I can do that but would like a symptom update from her please so we can try to determine how much longer

## 2016-04-02 ENCOUNTER — Encounter: Payer: Self-pay | Admitting: Physician Assistant

## 2016-04-02 ENCOUNTER — Ambulatory Visit (INDEPENDENT_AMBULATORY_CARE_PROVIDER_SITE_OTHER): Payer: 59 | Admitting: Physician Assistant

## 2016-04-02 ENCOUNTER — Encounter (HOSPITAL_COMMUNITY): Payer: Self-pay | Admitting: Internal Medicine

## 2016-04-02 ENCOUNTER — Inpatient Hospital Stay (HOSPITAL_COMMUNITY)
Admission: AD | Admit: 2016-04-02 | Discharge: 2016-04-06 | DRG: 385 | Disposition: A | Discharge status: Home / Self Care | Payer: 59 | Source: Ambulatory Visit | Attending: Internal Medicine | Admitting: Internal Medicine

## 2016-04-02 ENCOUNTER — Other Ambulatory Visit (INDEPENDENT_AMBULATORY_CARE_PROVIDER_SITE_OTHER): Payer: 59

## 2016-04-02 VITALS — BP 110/70 | HR 149 | Temp 98.6°F | Ht 70.5 in | Wt 202.6 lb

## 2016-04-02 DIAGNOSIS — J45909 Unspecified asthma, uncomplicated: Secondary | ICD-10-CM | POA: Diagnosis present

## 2016-04-02 DIAGNOSIS — E876 Hypokalemia: Secondary | ICD-10-CM

## 2016-04-02 DIAGNOSIS — Z91048 Other nonmedicinal substance allergy status: Secondary | ICD-10-CM | POA: Diagnosis not present

## 2016-04-02 DIAGNOSIS — Z79899 Other long term (current) drug therapy: Secondary | ICD-10-CM | POA: Diagnosis not present

## 2016-04-02 DIAGNOSIS — K921 Melena: Secondary | ICD-10-CM | POA: Diagnosis not present

## 2016-04-02 DIAGNOSIS — E860 Dehydration: Secondary | ICD-10-CM

## 2016-04-02 DIAGNOSIS — R634 Abnormal weight loss: Secondary | ICD-10-CM | POA: Diagnosis not present

## 2016-04-02 DIAGNOSIS — Z86010 Personal history of colonic polyps: Secondary | ICD-10-CM

## 2016-04-02 DIAGNOSIS — Z7952 Long term (current) use of systemic steroids: Secondary | ICD-10-CM

## 2016-04-02 DIAGNOSIS — Z87891 Personal history of nicotine dependence: Secondary | ICD-10-CM

## 2016-04-02 DIAGNOSIS — R1084 Generalized abdominal pain: Secondary | ICD-10-CM | POA: Diagnosis not present

## 2016-04-02 DIAGNOSIS — K51818 Other ulcerative colitis with other complication: Secondary | ICD-10-CM | POA: Diagnosis not present

## 2016-04-02 DIAGNOSIS — K5190 Ulcerative colitis, unspecified, without complications: Principal | ICD-10-CM | POA: Diagnosis present

## 2016-04-02 DIAGNOSIS — E43 Unspecified severe protein-calorie malnutrition: Secondary | ICD-10-CM

## 2016-04-02 DIAGNOSIS — R531 Weakness: Secondary | ICD-10-CM | POA: Diagnosis not present

## 2016-04-02 DIAGNOSIS — Z6828 Body mass index (BMI) 28.0-28.9, adult: Secondary | ICD-10-CM

## 2016-04-02 DIAGNOSIS — K51319 Ulcerative (chronic) rectosigmoiditis with unspecified complications: Secondary | ICD-10-CM

## 2016-04-02 DIAGNOSIS — K5150 Left sided colitis without complications: Secondary | ICD-10-CM | POA: Diagnosis not present

## 2016-04-02 DIAGNOSIS — R197 Diarrhea, unspecified: Secondary | ICD-10-CM | POA: Diagnosis present

## 2016-04-02 DIAGNOSIS — D649 Anemia, unspecified: Secondary | ICD-10-CM | POA: Diagnosis present

## 2016-04-02 DIAGNOSIS — K625 Hemorrhage of anus and rectum: Secondary | ICD-10-CM

## 2016-04-02 LAB — COMPREHENSIVE METABOLIC PANEL WITH GFR
ALT: 13 U/L — ABNORMAL LOW (ref 14–54)
AST: 14 U/L — ABNORMAL LOW (ref 15–41)
Albumin: 2.3 g/dL — ABNORMAL LOW (ref 3.5–5.0)
Alkaline Phosphatase: 78 U/L (ref 38–126)
Anion gap: 10 (ref 5–15)
BUN: 12 mg/dL (ref 6–20)
CO2: 31 mmol/L (ref 22–32)
Calcium: 9.6 mg/dL (ref 8.9–10.3)
Chloride: 96 mmol/L — ABNORMAL LOW (ref 101–111)
Creatinine, Ser: 0.76 mg/dL (ref 0.44–1.00)
GFR calc Af Amer: >60 mL/min (ref >60)
GFR calc non Af Amer: >60 mL/min (ref >60)
Glucose, Bld: 83 mg/dL (ref 65–99)
Potassium: 2.5 mmol/L — CL (ref 3.5–5.1)
Sodium: 137 mmol/L (ref 135–145)
Total Bilirubin: 0.6 mg/dL (ref 0.3–1.2)
Total Protein: 6.2 g/dL — ABNORMAL LOW (ref 6.5–8.1)

## 2016-04-02 LAB — CBC WITH DIFFERENTIAL/PLATELET
BASOS ABS: 0 10*3/uL (ref 0.0–0.1)
Basophils Absolute: 0 10*3/uL (ref 0.0–0.1)
Basophils Relative: 0.1 % (ref 0.0–3.0)
Basophils Relative: 0 %
EOS ABS: 0 10*3/uL (ref 0.0–0.7)
EOS PCT: 0.5 % (ref 0.0–5.0)
Eosinophils Absolute: 0.1 10*3/uL (ref 0.0–0.7)
Eosinophils Relative: 0 %
HCT: 36.5 % (ref 36.0–46.0)
HCT: 36.2 % (ref 36.0–46.0)
HEMOGLOBIN: 12.2 g/dL (ref 12.0–15.0)
HEMOGLOBIN: 12.1 g/dL (ref 12.0–15.0)
LYMPHS PCT: 18 % (ref 12.0–46.0)
LYMPHS PCT: 25 %
Lymphs Abs: 2.3 10*3/uL (ref 0.7–4.0)
Lymphs Abs: 3 10*3/uL (ref 0.7–4.0)
MCH: 27.5 pg (ref 26.0–34.0)
MCHC: 33.5 g/dL (ref 30.0–36.0)
MCHC: 33.4 g/dL (ref 30.0–36.0)
MCV: 82.9 fl (ref 78.0–100.0)
MCV: 82.3 fL (ref 78.0–100.0)
MONOS PCT: 1.4 % — AB (ref 3.0–12.0)
Monocytes Absolute: 0.2 10*3/uL (ref 0.1–1.0)
Monocytes Absolute: 1.4 10*3/uL — ABNORMAL HIGH (ref 0.1–1.0)
Monocytes Relative: 12 %
NEUTROS ABS: 7.6 10*3/uL (ref 1.7–7.7)
NEUTROS PCT: 63 %
Neutro Abs: 10.3 10*3/uL — ABNORMAL HIGH (ref 1.4–7.7)
Neutrophils Relative %: 80 % — ABNORMAL HIGH (ref 43.0–77.0)
Platelets: 557 10*3/uL — ABNORMAL HIGH (ref 150.0–400.0)
Platelets: 480 10*3/uL — ABNORMAL HIGH (ref 150–400)
RBC: 4.4 Mil/uL (ref 3.87–5.11)
RBC: 4.4 MIL/uL (ref 3.87–5.11)
RDW: 14.1 % (ref 11.5–15.5)
RDW: 13.4 % (ref 11.5–15.5)
WBC: 12.9 10*3/uL — AB (ref 4.0–10.5)
WBC: 12 10*3/uL — AB (ref 4.0–10.5)

## 2016-04-02 LAB — COMPREHENSIVE METABOLIC PANEL
ALBUMIN: 2.5 g/dL — AB (ref 3.5–5.2)
ALK PHOS: 80 U/L (ref 39–117)
ALT: 8 U/L (ref 0–35)
AST: 9 U/L (ref 0–37)
BUN: 11 mg/dL (ref 6–23)
CO2: 36 mEq/L — ABNORMAL HIGH (ref 19–32)
Calcium: 9.8 mg/dL (ref 8.4–10.5)
Chloride: 95 mEq/L — ABNORMAL LOW (ref 96–112)
Creatinine, Ser: 0.74 mg/dL (ref 0.40–1.20)
GFR: 103.24 mL/min (ref >60.00)
Glucose, Bld: 99 mg/dL (ref 70–99)
POTASSIUM: 2.9 meq/L — AB (ref 3.5–5.1)
SODIUM: 138 meq/L (ref 135–145)
TOTAL PROTEIN: 6.2 g/dL (ref 6.0–8.3)
Total Bilirubin: 0.5 mg/dL (ref 0.2–1.2)

## 2016-04-02 LAB — C DIFFICILE QUICK SCREEN W PCR REFLEX
C DIFFICILE (CDIFF) INTERP: NOT DETECTED
C DIFFICILE (CDIFF) TOXIN: NEGATIVE
C Diff antigen: NEGATIVE

## 2016-04-02 LAB — MAGNESIUM: MAGNESIUM: 1.8 mg/dL (ref 1.7–2.4)

## 2016-04-02 LAB — PHOSPHORUS: PHOSPHORUS: 2.4 mg/dL — AB (ref 2.5–4.6)

## 2016-04-02 LAB — TSH: TSH: 0.745 u[IU]/mL (ref 0.350–4.500)

## 2016-04-02 MED ORDER — PROMETHAZINE HCL 25 MG/ML IJ SOLN
12.5000 mg | Freq: Four times a day (QID) | INTRAMUSCULAR | Status: DC | PRN
  Administered 2016-04-03: 12.5 mg via INTRAVENOUS
  Filled 2016-04-02 (×2): qty 1

## 2016-04-02 MED ORDER — ENOXAPARIN SODIUM 40 MG/0.4ML ~~LOC~~ SOLN
40.0000 mg | SUBCUTANEOUS | Status: DC
  Administered 2016-04-02 – 2016-04-05 (×3): 40 mg via SUBCUTANEOUS
  Filled 2016-04-02 (×4): qty 0.4

## 2016-04-02 MED ORDER — FOLIC ACID 5 MG/ML IJ SOLN
1.0000 mg | Freq: Every day | INTRAMUSCULAR | Status: DC
  Administered 2016-04-02 – 2016-04-04 (×3): 1 mg via INTRAVENOUS
  Filled 2016-04-02 (×4): qty 0.2

## 2016-04-02 MED ORDER — LIP MEDEX EX OINT
TOPICAL_OINTMENT | CUTANEOUS | Status: AC
  Administered 2016-04-02: 19:00:00
  Filled 2016-04-02: qty 7

## 2016-04-02 MED ORDER — THIAMINE HCL 100 MG/ML IJ SOLN
100.0000 mg | Freq: Every day | INTRAMUSCULAR | Status: DC
  Administered 2016-04-02 – 2016-04-04 (×3): 100 mg via INTRAVENOUS
  Filled 2016-04-02 (×3): qty 2

## 2016-04-02 MED ORDER — ALBUTEROL SULFATE (2.5 MG/3ML) 0.083% IN NEBU: 2.5000 mg | INHALATION_SOLUTION | Freq: Four times a day (QID) | RESPIRATORY_TRACT | Status: DC | PRN

## 2016-04-02 MED ORDER — ACETAMINOPHEN 650 MG RE SUPP: 650.0000 mg | Freq: Four times a day (QID) | RECTAL | Status: DC | PRN

## 2016-04-02 MED ORDER — BOOST PLUS PO LIQD
237.0000 mL | Freq: Two times a day (BID) | ORAL | Status: DC
  Filled 2016-04-02 (×2): qty 237

## 2016-04-02 MED ORDER — ADULT MULTIVITAMIN W/MINERALS CH
1.0000 | ORAL_TABLET | Freq: Every day | ORAL | Status: DC
  Administered 2016-04-02 – 2016-04-05 (×4): 1 via ORAL
  Filled 2016-04-02 (×5): qty 1

## 2016-04-02 MED ORDER — METHYLPREDNISOLONE SODIUM SUCC 125 MG IJ SOLR
60.0000 mg | Freq: Every day | INTRAMUSCULAR | Status: DC
  Administered 2016-04-02 – 2016-04-05 (×4): 60 mg via INTRAVENOUS
  Filled 2016-04-02 (×4): qty 2

## 2016-04-02 MED ORDER — DICYCLOMINE HCL 10 MG PO CAPS
10.0000 mg | ORAL_CAPSULE | Freq: Four times a day (QID) | ORAL | Status: DC
  Administered 2016-04-02 – 2016-04-06 (×15): 10 mg via ORAL
  Filled 2016-04-02 (×19): qty 1

## 2016-04-02 MED ORDER — BOOST / RESOURCE BREEZE PO LIQD
1.0000 | Freq: Every day | ORAL | Status: DC
  Administered 2016-04-02: 1 via ORAL

## 2016-04-02 MED ORDER — POTASSIUM PHOSPHATES 15 MMOLE/5ML IV SOLN
40.0000 meq | Freq: Once | INTRAVENOUS | Status: AC
  Administered 2016-04-03: 40 meq via INTRAVENOUS
  Filled 2016-04-02: qty 9.09

## 2016-04-02 MED ORDER — NYSTATIN 100000 UNIT/ML MT SUSP
5.0000 mL | Freq: Four times a day (QID) | OROMUCOSAL | Status: DC
  Administered 2016-04-02 – 2016-04-05 (×9): 500000 [IU] via ORAL
  Filled 2016-04-02 (×11): qty 5

## 2016-04-02 MED ORDER — MAGNESIUM SULFATE 2 GM/50ML IV SOLN
2.0000 g | Freq: Once | INTRAVENOUS | Status: AC
  Administered 2016-04-02: 2 g via INTRAVENOUS
  Filled 2016-04-02: qty 50

## 2016-04-02 MED ORDER — MERCAPTOPURINE 50 MG PO TABS
75.0000 mg | ORAL_TABLET | Freq: Every day | ORAL | Status: DC
  Administered 2016-04-02 – 2016-04-03 (×2): 75 mg via ORAL
  Filled 2016-04-02 (×3): qty 2

## 2016-04-02 MED ORDER — KCL IN DEXTROSE-NACL 40-5-0.45 MEQ/L-%-% IV SOLN
INTRAVENOUS | Status: DC
  Administered 2016-04-02 – 2016-04-03 (×2): via INTRAVENOUS
  Filled 2016-04-02 (×3): qty 1000

## 2016-04-02 MED ORDER — POTASSIUM CHLORIDE 10 MEQ/100ML IV SOLN
10.0000 meq | INTRAVENOUS | Status: AC
  Administered 2016-04-03 (×4): 10 meq via INTRAVENOUS
  Filled 2016-04-02 (×4): qty 100

## 2016-04-02 MED ORDER — ACETAMINOPHEN 325 MG PO TABS: 650.0000 mg | ORAL_TABLET | Freq: Four times a day (QID) | ORAL | Status: DC | PRN

## 2016-04-02 MED ORDER — SODIUM CHLORIDE 0.9 % IV SOLN
8.0000 mg | Freq: Three times a day (TID) | INTRAVENOUS | Status: DC
  Administered 2016-04-02 – 2016-04-06 (×11): 8 mg via INTRAVENOUS
  Filled 2016-04-02 (×14): qty 4

## 2016-04-02 NOTE — H&P (Signed)
History and Physical    Katie Robinson:[id number] DOB: 12-Nov-1955 DOA: 04/02/2016  Referring MD/NP/PA: Dr. Carlean Purl PCP: Crisoforo Oxford, PA-C  Outpatient Specialists: Dr. Carlean Purl  Patient coming from: home  Chief Complaint: diarrhea and abdominal pain  HPI: Katie Robinson is a 60 y.o. female with medical history significant of left-sided ulcerative colitis which was diagnosed in 2015. She was initially treated with lialda and steroids, but had relapse on therapy.  In September 2017, due to ongoing diarrhea she underwent a repeat colonoscopy which demonstrated moderate proctosigmoiditis. She was started on 6 MP and Humira. She took her first dose of Humira on Monday 03/30/2016. She was confused about her 6-MP because the pharmacist told her she needed to take it either before or after a meal. Because she was having frequent vomiting and not really eating, she thought she was not able to take that medication. She only took a few doses before stopping. She was also unsure whether she should continue her prednisone while taking these other medications and so she stopped taking that also. Since that time, she has had a 36 pound weight loss with ongoing nausea, heaves, diarrhea more than 20 times a day. She is passing bright red blood with all of her bowel movements. She has a very sore throat and has been vomiting bile but no blood. She was seen by Nicoletta Ba in the GI clinic today who ordered lab work. The patient's potassium was 2.9. Because of how ill and weak she appeared, her weight loss, her electrolyte abnormality, her ongoing diarrhea, and inability to tolerate by mouth, she is being admitted for treatment of her ulcerative colitis.  Review of Systems:  General:  Denies fevers, chills, 36 pound weight loss over the last few months HEENT:  Denies changes to hearing and vision, rhinorrhea, sinus congestion, positive sore throat CV:  Denies chest pain and palpitations, lower extremity  edema.  PULM:  Denies SOB, wheezing, cough.   GI:  Her history of present illness GU:  Denies dysuria, frequency, urgency ENDO:  Denies polyuria, polydipsia.   HEME:  Denies hematemesis, blood in stools, melena, abnormal bruising or bleeding.  LYMPH:  Denies lymphadenopathy.   MSK:  Denies arthralgias, myalgias.   DERM:  Denies skin rash or ulcer.   NEURO:  Denies focal numbness, weakness, slurred speech, confusion, facial droop.   Diffusely weak and fatigued PSYCH:  Denies anxiety and depression.    Past Medical History:  Diagnosis Date  . Allergy    SEASONAL  . Anemia   . Arthralgia   . Asthma   . Chronic headache    many years  . Family history of premature coronary artery disease    father  . Former smoker    10 pack year history  . GERD (gastroesophageal reflux disease)    mild, occasional  . H/O cardiovascular stress test 2009  . Hx of adenomatous and sessile serrated colonic polyps 05/24/2014  . Iron deficiency 11/29/2015  . Left sided ulcerative (chronic) colitis ([place]) 05/18/2014  . Obesity   . Pectus excavatum    mild, left asymmetric  . S/P hysterectomy    due to fibroids  . Seasonal allergic rhinitis   . UC (ulcerative colitis) ([place])   . Urinary tract infection 2013  . Vitamin D deficiency 07/18/2014  . Wears glasses     Past Surgical History:  Procedure Laterality Date  . COLONOSCOPY    . FOOT TENDON SURGERY Left 2013  . NAVEL REMOVED  AS A CHILD  . OTHER SURGICAL HISTORY     biopsy for possible sarcoidosis  . POLYPECTOMY    . TONSILLECTOMY    . TOTAL ABDOMINAL HYSTERECTOMY  08/14/00   total; Dr. Molli Posey  . WISDOM TOOTH EXTRACTION       reports that she quit smoking about 27 years ago. Her smoking use included Cigarettes. She has never used smokeless tobacco. She reports that she drinks alcohol. She reports that she does not use drugs.  No Known Allergies  Family History  Problem Relation Age of Onset  . Asthma Father   . Heart disease  Father 38    died of MI mid 08/14/2022  . Hypertension Sister   . Asthma Sister   . Hypertension Mother   . Aneurysm Mother     brain  . Alzheimer's disease Mother   . Heart disease Mother   . Cancer Neg Hx   . Stroke Neg Hx   . Diabetes Neg Hx   . Colon cancer Neg Hx   . Colon polyps Neg Hx     Prior to Admission medications   Medication Sig Start Date End Date Taking? Authorizing Provider  Adalimumab (HUMIRA PEN-CROHNS STARTER) 40 MG/0.8ML PNKT Inject 160 mg into the skin once. On day 0, 80 mg on day 15 03/24/16 03/24/16  Gatha Mayer, MD  Adalimumab (HUMIRA) 40 MG/0.8ML PSKT Inject 0.8 mLs (40 mg total) into the skin every 14 (fourteen) days. 03/24/16   Gatha Mayer, MD  albuterol (PROVENTIL) (2.5 MG/3ML) 0.083% nebulizer solution Take 3 mLs (2.5 mg total) by nebulization every 6 (six) hours as needed for wheezing or shortness of breath. 10/12/13   Camelia Eng Tysinger, PA-C  diphenoxylate-atropine (LOMOTIL) 2.5-0.025 MG tablet Take 1 tablet by mouth 4 (four) times daily as needed for diarrhea or loose stools. 03/19/16   Gatha Mayer, MD  hydrocortisone (CORTENEMA) 100 MG/60ML enema Place 1 enema (100 mg total) rectally at bedtime. 02/27/16   Gatha Mayer, MD  mercaptopurine (PURINETHOL) 50 MG tablet Take 0.5 tablets (25 mg total) by mouth daily. Give on an empty stomach 1 hour before or 2 hours after meals. Caution: Chemotherapy. 03/19/16   Gatha Mayer, MD  mesalamine (LIALDA) 1.2 G EC tablet Take 4 tablets daily. 05/30/15   Amy S Esterwood, PA-C  predniSONE (DELTASONE) 10 MG tablet Take 4 tablets (40 mg total) by mouth daily with breakfast. Reduce dose by 10 mg every 2 weeks until reach 10 mg and stay 02/27/16   Gatha Mayer, MD  Vitamin D, Cholecalciferol, 1000 units CAPS Take 1 capsule by mouth daily.    Historical Provider, MD  Vitamin D, Ergocalciferol, (DRISDOL) 50000 units CAPS capsule Take 1 capsule (50,000 Units total) by mouth every 7 (seven) days. 02/27/16   Gatha Mayer, MD      Physical Exam: Vitals:   04/02/16 1603  BP: 124/82  Pulse: (!) 102  Resp: 20  Temp: 97.9 F (36.6 C)  TempSrc: Oral  SpO2: 100%    Constitutional: NAD, calm, comfortable, temporal wasting Eyes: PERRL, lids and conjunctivae normal ENMT: Dry mucous membranes. Posterior pharynx erythematous and injected, no obvious plaques.  Normal dentition.  Neck: normal, supple, no masses, no thyromegaly Respiratory: clear to auscultation bilaterally, no wheezing, no crackles. Normal respiratory effort. No accessory muscle use.  Cardiovascular: Tachycardic, regular rhythm, no murmurs / rubs / gallops. No extremity edema. 2+ pedal pulses. No carotid bruits.  Abdomen: NABS, soft, tender to palpation  to the left of the umbilicus, left lower quadrant and just above the suprapubic area, no rebound or guarding.  Musculoskeletal: no clubbing / cyanosis. No joint deformity upper and lower extremities. Good ROM, no contractures. Normal muscle tone.  Skin: no rashes, lesions, ulcers. No induration Neurologic: CN 2-12 grossly intact. Sensation intact, DTR normal. Strength 5/5 in all 4.  Psychiatric: Normal judgment and insight. Alert and oriented x 3. Normal mood.   Labs on Admission: I have personally reviewed following labs and imaging studies  CBC:  Recent Labs Lab 04/02/16 1307  WBC 12.9*  NEUTROABS 10.3*  HGB 12.2  HCT 36.5  MCV 82.9  PLT 496.7*   Basic Metabolic Panel:  Recent Labs Lab 04/02/16 1307  NA 138  K 2.9*  CL 95*  CO2 36*  GLUCOSE 99  BUN 11  CREATININE 0.74  CALCIUM 9.8   GFR: Estimated Creatinine Clearance: 94 mL/min (by C-G formula based on SCr of 0.74 mg/dL). Liver Function Tests:  Recent Labs Lab 04/02/16 1307  AST 9  ALT 8  ALKPHOS 80  BILITOT 0.5  PROT 6.2  ALBUMIN 2.5*   No results for input(s): LIPASE, AMYLASE in the last 168 hours. No results for input(s): AMMONIA in the last 168 hours. Coagulation Profile: No results for input(s): INR, PROTIME  in the last 168 hours. Cardiac Enzymes: No results for input(s): CKTOTAL, CKMB, CKMBINDEX, TROPONINI in the last 168 hours. BNP (last 3 results) No results for input(s): PROBNP in the last 8760 hours. HbA1C: No results for input(s): HGBA1C in the last 72 hours. CBG: No results for input(s): GLUCAP in the last 168 hours. Lipid Profile: No results for input(s): CHOL, HDL, LDLCALC, TRIG, CHOLHDL, LDLDIRECT in the last 72 hours. Thyroid Function Tests: No results for input(s): TSH, T4TOTAL, FREET4, T3FREE, THYROIDAB in the last 72 hours. Anemia Panel: No results for input(s): VITAMINB12, FOLATE, FERRITIN, TIBC, IRON, RETICCTPCT in the last 72 hours. Urine analysis:    Component Value Date/Time   COLORURINE YELLOW 04/24/2015 2029   APPEARANCEUR CLOUDY (A) 04/24/2015 2029   LABSPEC 1.021 04/24/2015 2029   PHURINE 6.5 04/24/2015 2029   GLUCOSEU NEGATIVE 04/24/2015 2029   HGBUR NEGATIVE 04/24/2015 2029   BILIRUBINUR n 04/29/2015 1218   KETONESUR NEGATIVE 04/24/2015 2029   PROTEINUR 1 04/29/2015 1218   PROTEINUR NEGATIVE 04/24/2015 2029   UROBILINOGEN 0.2 04/29/2015 1218   UROBILINOGEN 1.0 04/24/2015 2029   NITRITE n 04/29/2015 1218   NITRITE POSITIVE (A) 04/24/2015 2029   LEUKOCYTESUR Negative 04/29/2015 1218   Sepsis Labs: @LABRCNTIP (procalcitonin:4,lacticidven:4) )No results found for this or any previous visit (from the past 240 hour(s)).   Radiological Exams on Admission: No results found.  EKG: pending  Assessment/Plan Active Problems:   * No active hospital problems. *   Uncontrolled ulcerative colitis with abdominal pain, copious diarrhea, vomiting, inability to tolerate by mouth, bloody stools -  Appreciate GI assistance -  C diff PCR -  Start SoluMedrol IV 60 mg once daily -Start 6-MP 75 mg once daily -Dose of Humira is on October 30 - Does she need to resume her lialda? -Scheduled Bentyl - Scheduled Zofran with Phenergan when necessary - check QTC on  ECG -Nystatin and Magic mouthwash for mouth pain  Severe protein calorie malnutrition -  Full liquid diet -Supplements -Nutrition consultation Asthma, stable, continue when necessary albuterol -  TSH  Hypokalemia, likely secondary to vomiting and diarrhea - Check magnesium and phosphorus -  Add potassium to IV fluids  Generalized weakness -  PT evaluation  DVT prophylaxis: lovenox  Code Status: full Family Communication: patient and her daughter who was present at bedside, questions answered  Disposition Plan: Likely discharged home  Consults called: Gastroenterology, referred by Dr. Carlean Purl and Alonza Bogus is planning to see again in AM  Admission status: Inpatient, at risk for decompensation due to severity of patient's electrolyte imbalance, malnutrition, inability to tolerate PO and difficult to manage colitis  Janece Canterbury MD [facility] Pager [phone]  If 7PM-7AM, please contact night-coverage [email or web address] Password [email or web address]  04/02/2016, 4:36 PM

## 2016-04-02 NOTE — Telephone Encounter (Signed)
Patient reports continued diarrhea 20 times a day. She is using the lomotil qid as ordered.  Worse after meals.  She is weak .  She is trying to drink Gatorade to replace diarrhea loss.  She stopped prednisone 40 mg on Monday.  What dose should she start back on today?

## 2016-04-02 NOTE — Telephone Encounter (Signed)
Prednisone should stay at 40

## 2016-04-02 NOTE — Telephone Encounter (Signed)
20 times a day may require hospitalization!  Give her another week off  Can an APP see her and assess her today?  If not I could see her tomorrow but go ahead and get CBC, CMET  Also needs oral rehydration advice  ORAL REHYDRATION SOLUTION RECIPES   Sugar and salt water   . 1 quart water .  teaspoon salt . 6 teaspoons sugar . Optional: Crystal Light to taste (especially lemonade or orange-pineapple flavors)   Gatorade G2   . 4 cups Gatorade G2 (or one, 32 ounce bottle) . 1/2 teaspoon salt   Chicken Broth   . 4 cups water . 1 dry chicken broth cube .  teaspoon salt . 2 tablespoon sugar OR . 2 cups liquid broth . 2 cups water . 2 tablespoon sugar  Tomato Juice   . 2  cups tomato juice . 1  cups water  Homemade Cereal Based  .  cup dry, precooked baby rice cereal . 2 cups water .  teaspoon salt . Combine ingredients and mix until well dissolved and smooth. Refrigerate. Solution should be thick, but pourable and drinkable.

## 2016-04-02 NOTE — Patient Instructions (Signed)
Go to [facility], go to admitting.

## 2016-04-02 NOTE — Progress Notes (Signed)
Subjective:    Patient ID: Katie Robinson, female    DOB: 1955/11/14, 60 y.o.   MRN: [id number]  HPI Katie Robinson is a pleasant 60 year old African-American female known to Dr. Carlean Purl who was diagnosed with left-sided ulcerative colitis in 2015. She had initially been treated with Sable Feil and has required frequent courses of steroids. She was last seen in the office in early September and at that time complaining of progressive and of watery diarrhea and rectal bleeding. She had been on prednisone at home prior to that he was continued on 20 mg per day. 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 At that time decision was made to proceed with Biologics, she was also started on 6-MP at 75 mg per day and was to initiate Humira. Patient actually took her first loading dose of Humira on Monday, 03/30/2016. Unfortunately she also stopped taking prednisone because she wasn't sure whether she should continue it or not. On further questioning she had also been taking 40 mg per day rather than 20 and is not certain how long she been doing that for. She apparently only took a dose or 2 of 6-MP and did not think it was helping so stopped taking it. She has been using Lomotil. Daughter relates that she has gotten progressively weaker over the past few weeks and really has not been doing much of anything other than lying in bed. She has not been eating and drinking much as she says everything that she puts in her mouth causes abdominal cramping nausea and then urgency for diarrhea. She is having about 20 bowel movements per day and says she's passing bright red blood with all of her bowel movements. She says she has been able to feel her pulse pounding in her head over this past week. He denies any syncopal episodes or significant lightheadedness with ambulation but hasn't been walking around much. She was brought into the office today, stat labs were done  potassium is 2.9, WBC 12.9, hemoglobin 12.2 hematocrit of 36.5, BUN 11 creatinine 0.74. Weight is down almost 30 pounds since last office visit.  Review of Systems Pertinent positive and negative review of systems were noted in the above HPI section.  All other review of systems was otherwise negative.  Facility-Administered Encounter Medications as of 04/02/2016  Medication  . 0.9 %  sodium chloride infusion   Outpatient Encounter Prescriptions as of 04/02/2016  Medication Sig  . Adalimumab (HUMIRA) 40 MG/0.8ML PSKT Inject 0.8 mLs (40 mg total) into the skin every 14 (fourteen) days.  Marland Kitchen albuterol (PROVENTIL) (2.5 MG/3ML) 0.083% nebulizer solution Take 3 mLs (2.5 mg total) by nebulization every 6 (six) hours as needed for wheezing or shortness of breath.  . diphenoxylate-atropine (LOMOTIL) 2.5-0.025 MG tablet Take 1 tablet by mouth 4 (four) times daily as needed for diarrhea or loose stools.  . hydrocortisone (CORTENEMA) 100 MG/60ML enema Place 1 enema (100 mg total) rectally at bedtime.  . mercaptopurine (PURINETHOL) 50 MG tablet Take 0.5 tablets (25 mg total) by mouth daily. Give on an empty stomach 1 hour before or 2 hours after meals. Caution: Chemotherapy.  . mesalamine (LIALDA) 1.2 G EC tablet Take 4 tablets daily.  . predniSONE (DELTASONE) 10 MG tablet Take 4 tablets (40 mg total) by mouth daily with breakfast. Reduce dose by 10 mg every 2 weeks until reach 10 mg and stay  . Vitamin D, Cholecalciferol, 1000 units CAPS Take 1 capsule by mouth daily.  Marland Kitchen  Vitamin D, Ergocalciferol, (DRISDOL) 50000 units CAPS capsule Take 1 capsule (50,000 Units total) by mouth every 7 (seven) days.  . Adalimumab (HUMIRA PEN-CROHNS STARTER) 40 MG/0.8ML PNKT Inject 160 mg into the skin once. On day 0, 80 mg on day 15   No Known Allergies Patient Active Problem List   Diagnosis Date Noted  . Iron deficiency 11/29/2015  . Vitamin D deficiency 07/18/2014  . Hx of adenomatous and sessile serrated colonic  polyps 05/24/2014  . Left sided ulcerative (chronic) colitis ([place]) 05/18/2014   Social History   Social History  . Marital status: Divorced    Spouse name: N/A  . Number of children: 2  . Years of education: N/A   Occupational History  . postal clerk    Social History Main Topics  . Smoking status: Former Smoker    Types: Cigarettes    Quit date: 06/15/1988  . Smokeless tobacco: Never Used  . Alcohol use 0.0 oz/week     Comment: rarely  . Drug use: No  . Sexual activity: Not on file   Other Topics Concern  . Not on file   Social History Narrative   Lives with her 2 adult children, not married, no exercise, active with walking on the job at the [place] [employer]    Ms. Civil's family history includes Alzheimer's disease in her mother; Aneurysm in her mother; Asthma in her father and sister; Heart disease in her mother; Heart disease (age of onset: 25) in her father; Hypertension in her mother and sister.      Objective:    Vitals:   04/02/16 1342  BP: 110/70  Pulse: (!) 149  Temp: 98.6 F (37 C)    Physical Exam  well-developed ill appearing African-American female in no acute distress, accompanied by her daughter, patient lying on the exam table blood pressure 110/70 pulse after lying down 108, weight 202 down almost 30 pounds since last office visit. HEENT; nontraumatic normocephalic EOMI PERRLA sclera anicteric, Tongue white and coated probable thrush, Cardiovascular; tachycardia regular rhythm with S1-S2 no murmur or gallop, Pulmonary; clear bilaterally, Abdomen; soft bowel sounds are active is mildly tender in the left mid and left lower quadrant there is no guarding or rebound no palpable mass or hepatosplenomegaly, Rectal; exam not done, Extremities ;no clubbing cyanosis or edema skin warm and dry, Neuropsych; mood and affect appropriate       Assessment & Plan:   #18 60 year old African-American female with severe left-sided ulcerative colitis refractory to  outpatient management with progressive weakness, weight loss, numerous bloody stools per day and hypokalemia. Is not clear how compliant she's been with her medication regimen, and clearly didn't understand how she was supposed to be taking some of her meds. Patient has had initial loading dose of Humira and will be due for next dose of Humira on Monday, October 30 Patient is steroid dependent and had stopped her medication on her own about 4 days ago Patient was to start 6-MP at 75 mg per day, took one dose and did not continue  Plan; patient needs to be hospitalized for IV fluid rehydration, start IV Solu-Medrol 60 mg per day, initially and will need very slow steroid taper over the next couple of months Clear to full liquid diet Continue scheduled Humira as above Restart 6-MP at 75 mg by mouth daily Add around-the-clock anti-spasmodic for cramping and urgency Start Mycostatin oral suspension 5 mL swish and swallow 4 times daily for oral thrush Patient will need careful  discussions regarding medications at the time of discharge  Patient will be admitted to the hospitalist service, I have spoken to Dr. Wynelle Cleveland who has accepted the patient. GI will follow in consultation.   Katie Robinson Genia Harold PA-C 04/02/2016   Cc: Carlena Hurl, PA-C

## 2016-04-02 NOTE — Telephone Encounter (Signed)
Patient will come in today for labs and will see Ellouise Newer, [place] tomorrow at 2:15.  She verbalized understanding to resume prednisone at 40 mg.  She will stop by and pick up recipes for oral hydration and letter

## 2016-04-03 ENCOUNTER — Ambulatory Visit: Payer: 59 | Admitting: Physician Assistant

## 2016-04-03 ENCOUNTER — Encounter (HOSPITAL_COMMUNITY): Payer: Self-pay | Admitting: *Deleted

## 2016-04-03 DIAGNOSIS — K5150 Left sided colitis without complications: Secondary | ICD-10-CM

## 2016-04-03 DIAGNOSIS — R531 Weakness: Secondary | ICD-10-CM

## 2016-04-03 DIAGNOSIS — E876 Hypokalemia: Secondary | ICD-10-CM

## 2016-04-03 DIAGNOSIS — E43 Unspecified severe protein-calorie malnutrition: Secondary | ICD-10-CM

## 2016-04-03 LAB — RENAL FUNCTION PANEL
ALBUMIN: 1.8 g/dL — AB (ref 3.5–5.0)
ANION GAP: 5 (ref 5–15)
BUN: 10 mg/dL (ref 6–20)
CALCIUM: 9.1 mg/dL (ref 8.9–10.3)
CO2: 31 mmol/L (ref 22–32)
Chloride: 97 mmol/L — ABNORMAL LOW (ref 101–111)
Creatinine, Ser: 0.63 mg/dL (ref 0.44–1.00)
Glucose, Bld: 227 mg/dL — ABNORMAL HIGH (ref 65–99)
PHOSPHORUS: 3.8 mg/dL (ref 2.5–4.6)
POTASSIUM: 4.1 mmol/L (ref 3.5–5.1)
SODIUM: 133 mmol/L — AB (ref 135–145)

## 2016-04-03 LAB — MAGNESIUM: MAGNESIUM: 2.4 mg/dL (ref 1.7–2.4)

## 2016-04-03 MED ORDER — KCL IN DEXTROSE-NACL 20-5-0.45 MEQ/L-%-% IV SOLN
INTRAVENOUS | Status: DC
  Administered 2016-04-03 – 2016-04-06 (×2): via INTRAVENOUS
  Filled 2016-04-03 (×5): qty 1000

## 2016-04-03 MED ORDER — DIPHENOXYLATE-ATROPINE 2.5-0.025 MG PO TABS
1.0000 | ORAL_TABLET | Freq: Three times a day (TID) | ORAL | Status: DC | PRN
  Administered 2016-04-03: 1 via ORAL
  Filled 2016-04-03: qty 1

## 2016-04-03 MED ORDER — PREMIER PROTEIN SHAKE
11.0000 [oz_av] | Freq: Two times a day (BID) | ORAL | Status: DC
  Administered 2016-04-03 – 2016-04-05 (×4): 11 [oz_av] via ORAL
  Filled 2016-04-03 (×4): qty 325.31

## 2016-04-03 NOTE — Evaluation (Signed)
One-Time Physical Therapy Evaluation Patient Details Name: Katie Robinson MRN: [id number] DOB: [birth year] Today's Date: 04/03/2016   History of Present Illness  Pt is a 60 y.o. female with medical history significant of left-sided ulcerative colitis which was diagnosed in 2015. Pt admitted for treatment of worsening ulcerative colitis.  Clinical Impression  Pt evaluated by PT with no further acute PT needs identified. All education has been completed and the patient has no further questions. See below for any follow-up PT or equipment needs. Pt is at least modified independent for all mobility and is agreeable to being discharged from acute PT to continue regaining her strength and endurance at home.    Follow Up Recommendations No PT follow up    Equipment Recommendations  None recommended by PT    Recommendations for Other Services       Precautions / Restrictions Precautions Precautions: Fall (low fall risk) Restrictions Weight Bearing Restrictions: No      Mobility  Bed Mobility Overal bed mobility: Independent                Transfers Overall transfer level: Independent                  Ambulation/Gait Ambulation/Gait assistance: Modified independent (Device/Increase time) Ambulation Distance (Feet): 400 Feet Assistive device: None Gait Pattern/deviations: Step-through pattern;WFL(Within Functional Limits) Gait velocity: decreased   General Gait Details: increased time required to perform gait; pt reported she wanted to take her time as she has lost a significant amount of strength in recent months  Stairs            Wheelchair Mobility    Modified Rankin (Stroke Patients Only)       Balance Overall balance assessment: No apparent balance deficits (not formally assessed)                                           Pertinent Vitals/Pain Pain Assessment: 0-10 Pain Score: 3  Pain Location: L lower abdomen Pain  Descriptors / Indicators: Discomfort Pain Intervention(s): Limited activity within patient's tolerance;Monitored during session    Home Living Family/patient expects to be discharged to:: Private residence Living Arrangements: Children Available Help at Discharge: Family;Available PRN/intermittently Type of Home: House Home Access: Stairs to enter Entrance Stairs-Rails: Can reach both Entrance Stairs-Number of Steps: 4 Home Layout: Two level;Bed/bath upstairs Home Equipment: None      Prior Function Level of Independence: Independent               Hand Dominance        Extremity/Trunk Assessment               Lower Extremity Assessment: Overall WFL for tasks assessed         Communication   Communication: No difficulties  Cognition Arousal/Alertness: Awake/alert Behavior During Therapy: WFL for tasks assessed/performed Overall Cognitive Status: Within Functional Limits for tasks assessed                      General Comments      Exercises     Assessment/Plan    PT Assessment Patent does not need any further PT services  PT Problem List            PT Treatment Interventions      PT Goals (Current goals can be found in the Care Plan section)  Acute Rehab PT Goals PT Goal Formulation: All assessment and education complete, DC therapy    Frequency     Barriers to discharge        Co-evaluation               End of Session Equipment Utilized During Treatment: Gait belt Activity Tolerance: Patient tolerated treatment well Patient left: in bed;with call bell/phone within reach Nurse Communication: Mobility status         Time: 1455-1511 PT Time Calculation (min) (ACUTE ONLY): 16 min   Charges:   PT Evaluation $PT Eval Low Complexity: 1 Procedure     PT G CodesDewitt Hoes 2016-05-02, 4:00 PM Dewitt Hoes, SPT

## 2016-04-03 NOTE — Progress Notes (Signed)
     [facility] Progress Note  Subjective:  Feeling better than yesterday.had about 7-8 BMs with blood since admission.  Tolerating full liquids.  Objective:  Vital signs in last 24 hours: Temp:  [97.7 F (36.5 C)-98.6 F (37 C)] 97.7 F (36.5 C) (10/20 0604) Pulse Rate:  [80-149] 80 (10/20 0604) Resp:  [17-20] 17 (10/20 0604) BP: (110-124)/(70-82) 117/72 (10/20 0604) SpO2:  [95 %-100 %] 96 % (10/20 0604) Weight:  [202 lb (91.6 kg)-202 lb 9.6 oz (91.9 kg)] 202 lb (91.6 kg) (10/19 1600) Last BM Date: 04/02/16 General:  Alert, Well-developed, in NAD Heart:  Regular rate and rhythm; no murmurs Pulm:  CTAB.  No W/R/R. Abdomen:  Soft, non-distended.  BS present.  Minimal lower abdominal TTP. Extremities:  Without edema. Neurologic:  Alert and oriented x 4;  grossly normal neurologically. Psych:  Alert and cooperative. Normal mood and affect.  Lab Results:  Recent Labs  04/02/16 1307 04/02/16 1740  WBC 12.9* 12.0*  HGB 12.2 12.1  HCT 36.5 36.2  PLT 557.0* 480*   BMET  Recent Labs  04/02/16 1307 04/02/16 1740 04/03/16 0450  NA 138 137 133*  K 2.9* 2.5* 4.1  CL 95* 96* 97*  CO2 36* 31 31  GLUCOSE 99 83 227*  BUN 11 12 10   CREATININE 0.74 0.76 0.63  CALCIUM 9.8 9.6 9.1   LFT  Recent Labs  04/02/16 1740 04/03/16 0450  PROT 6.2*  --   ALBUMIN 2.3* 1.8*  AST 14*  --   ALT 13*  --   ALKPHOS 78  --   BILITOT 0.6  --    Assessment / Plan: #44 60 year old African-American female with severe left-sided ulcerative colitis refractory to outpatient management with progressive weakness, weight loss, numerous bloody stools per day and hypokalemia (has been corrected). Is not clear how compliant she's been with her medication regimen, and clearly didn't understand how she was supposed to be taking some of her meds. Patient has had initial loading dose of Humira and will be due for next dose of Humira on Monday, October 30. Patient is steroid dependent and  had stopped her medication on her own about 4 days ago. Patient was to start 6-MP at 75 mg per day, took one dose and did not continue.  Cdiff negative.  *Continue IV Solu-Medrol 60 mg per day for now.  Will need very slow steroid taper over the next couple of months. *Continue full liquid diet. *Continue scheduled Humira as above. *Continue 6-MP at 75 mg by mouth daily. *Continue Bentyl 10 mg every 6 hours.   LOS: 1 day   Rula Keniston D.  04/03/2016, 8:59 AM  Pager number [phone]

## 2016-04-03 NOTE — Progress Notes (Signed)
PROGRESS NOTE  Katie Robinson  OQH:[id number] DOB: 1955/09/16 DOA: 04/02/2016 PCP: Crisoforo Oxford, PA-C  Brief Narrative:  Katie Robinson is a 60 y.o. female with medical history significant of left-sided ulcerative colitis who presents with frequent watery and bloody BMs, vomiting, and 36-lb weight loss.    Assessment & Plan:   Active Problems:   Left sided ulcerative (chronic) colitis ([facility])   Bloody stools   Protein-calorie malnutrition, severe ([facility])   Generalized weakness   Ulcerative colitis ([place])  Uncontrolled ulcerative colitis with abdominal pain, copious diarrhea, vomiting, inability to tolerate by mouth, bloody stools -  Appreciate GI assistance -  C diff PCR negative -  Continue SoluMedrol IV 60 mg once daily -  Holding 6-MP 75 mg once daily until able to better tolerate PO -Dose of Humira is on October 30 -Scheduled Bentyl - Scheduled Zofran with Phenergan when necessary  Severe protein calorie malnutrition with weight loss -  Full liquid diet -Supplements -Nutrition consultation -  TSH wnl  Asthma, stable, continue when necessary albuterol  Hypokalemia, hypophosphatemia, likely secondary to vomiting and diarrhea, resolving -  Given IV potassium, magnesium, and phosphorus  Generalized weakness -  PT evaluation pending  DVT prophylaxis: lovenox  Code Status: full Family Communication: patient alone  Disposition Plan: Likely discharged home in a day or two  Consultants:   Gastroenterology, Dr. Havery Moros  Procedures:  none  Antimicrobials:   none    Subjective: Feeling much better today.  Still had bloody diarrhea overnight, but slowed down some this morning and appetite has returned some.  More energetic today.    Objective: Vitals:   04/02/16 1600 04/02/16 1603 04/02/16 2129 04/03/16 0604  BP:  124/82 120/75 117/72  Pulse:  (!) 102 88 80  Resp:  20 19 17   Temp:  97.9 F (36.6 C) 98 F (36.7 C) 97.7 F (36.5 C)  TempSrc:   Oral Oral Oral  SpO2:  100% 95% 96%  Weight: 91.6 kg (202 lb)     Height: 5' 10.5" (1.791 m)       Intake/Output Summary (Last 24 hours) at 04/03/16 1356 Last data filed at 04/02/16 1837  Gross per 24 hour  Intake                0 ml  Output                0 ml  Net                0 ml   Filed Weights   04/02/16 1600  Weight: 91.6 kg (202 lb)    Examination:  General exam:  Adult female, cachectic.  No acute distress.  HEENT:  NCAT, MMM Respiratory system: Clear to auscultation bilaterally Cardiovascular system: Regular rate and rhythm, normal S1/S2. No murmurs, rubs, gallops or clicks.  Warm extremities Gastrointestinal system: Normal active bowel sounds, soft, nondistended, mild TTP in the left lower quadrant and suprapubic area without rebound or guarding MSK:  Normal tone and bulk, no lower extremity edema Neuro:  Grossly intact    Data Reviewed: I have personally reviewed following labs and imaging studies  CBC:  Recent Labs Lab 04/02/16 1307 04/02/16 1740  WBC 12.9* 12.0*  NEUTROABS 10.3* 7.6  HGB 12.2 12.1  HCT 36.5 36.2  MCV 82.9 82.3  PLT 557.0* 546*   Basic Metabolic Panel:  Recent Labs Lab 04/02/16 1307 04/02/16 1740 04/03/16 0450  NA 138 137 133*  K 2.9* 2.5*  4.1  CL 95* 96* 97*  CO2 36* 31 31  GLUCOSE 99 83 227*  BUN 11 12 10   CREATININE 0.74 0.76 0.63  CALCIUM 9.8 9.6 9.1  MG  --  1.8 2.4  PHOS  --  2.4* 3.8   GFR: Estimated Creatinine Clearance: 93.8 mL/min (by C-G formula based on SCr of 0.63 mg/dL). Liver Function Tests:  Recent Labs Lab 04/02/16 1307 04/02/16 1740 04/03/16 0450  AST 9 14*  --   ALT 8 13*  --   ALKPHOS 80 78  --   BILITOT 0.5 0.6  --   PROT 6.2 6.2*  --   ALBUMIN 2.5* 2.3* 1.8*   No results for input(s): LIPASE, AMYLASE in the last 168 hours. No results for input(s): AMMONIA in the last 168 hours. Coagulation Profile: No results for input(s): INR, PROTIME in the last 168 hours. Cardiac Enzymes: No  results for input(s): CKTOTAL, CKMB, CKMBINDEX, TROPONINI in the last 168 hours. BNP (last 3 results) No results for input(s): PROBNP in the last 8760 hours. HbA1C: No results for input(s): HGBA1C in the last 72 hours. CBG: No results for input(s): GLUCAP in the last 168 hours. Lipid Profile: No results for input(s): CHOL, HDL, LDLCALC, TRIG, CHOLHDL, LDLDIRECT in the last 72 hours. Thyroid Function Tests:  Recent Labs  04/02/16 1740  TSH 0.745   Anemia Panel: No results for input(s): VITAMINB12, FOLATE, FERRITIN, TIBC, IRON, RETICCTPCT in the last 72 hours. Urine analysis:    Component Value Date/Time   COLORURINE YELLOW 04/24/2015 2029   APPEARANCEUR CLOUDY (A) 04/24/2015 2029   LABSPEC 1.021 04/24/2015 2029   PHURINE 6.5 04/24/2015 2029   GLUCOSEU NEGATIVE 04/24/2015 2029   HGBUR NEGATIVE 04/24/2015 2029   BILIRUBINUR n 04/29/2015 1218   KETONESUR NEGATIVE 04/24/2015 2029   PROTEINUR 1 04/29/2015 1218   PROTEINUR NEGATIVE 04/24/2015 2029   UROBILINOGEN 0.2 04/29/2015 1218   UROBILINOGEN 1.0 04/24/2015 2029   NITRITE n 04/29/2015 1218   NITRITE POSITIVE (A) 04/24/2015 2029   LEUKOCYTESUR Negative 04/29/2015 1218   Sepsis Labs: @LABRCNTIP (procalcitonin:4,lacticidven:4)  ) Recent Results (from the past 240 hour(s))  C difficile quick scan w PCR reflex     Status: None   Collection Time: 04/02/16  4:00 PM  Result Value Ref Range Status   C Diff antigen NEGATIVE NEGATIVE Final   C Diff toxin NEGATIVE NEGATIVE Final   C Diff interpretation No C. difficile detected.  Final      Radiology Studies: No results found.   Scheduled Meds: . dicyclomine  10 mg Oral Q6H  . enoxaparin (LOVENOX) injection  40 mg Subcutaneous Q24H  . folic acid  1 mg Intravenous Daily  . methylPREDNISolone (SOLU-MEDROL) injection  60 mg Intravenous Daily  . multivitamin with minerals  1 tablet Oral Daily  . nystatin  5 mL Oral QID  . ondansetron (ZOFRAN) IV  8 mg Intravenous Q8H  .  protein supplement shake  11 oz Oral BID BM  . thiamine  100 mg Intravenous Daily   Continuous Infusions: . dextrose 5 % and 0.45 % NaCl with KCl 40 mEq/L 100 mL/hr at 04/03/16 0642     LOS: 1 day    Time spent: 30 min    Janece Canterbury, MD [facility] Pager [phone]  If 7PM-7AM, please contact night-coverage [email or web address] Password [email or web address] 04/03/2016, 1:56 PM

## 2016-04-03 NOTE — Progress Notes (Signed)
   04/03/16 1600  Clinical Encounter Type  Visited With Patient  Visit Type Initial  Referral From Nurse  Consult/Referral To Chaplain  Advance Directives (For Healthcare)  Would patient like information on creating an advanced directive? Yes - Scientist, clinical (histocompatibility and immunogenetics) given  [facility] explained AD to patient. Patient will fill out over the weekend. Patient is scheduled for discharge on 04/06/16 so [facility] will follow up in the morning on the 23rd. Roe Coombs 04/03/16

## 2016-04-03 NOTE — Progress Notes (Signed)
Agree with Ms. Esterwood's assessment and plan. Kalista Laguardia E. Nayelie Gionfriddo, MD, FACG   

## 2016-04-03 NOTE — Progress Notes (Signed)
Initial Nutrition Assessment  DOCUMENTATION CODES:   Severe malnutrition in context of acute illness/injury  INTERVENTION:  Discontinued Boost Breeze and Boost Plus per patient request.  Ordered Premier Protein po BID, each supplement has 160 kcal and 30 grams protein.  Will send Magic Cup TID with each tray, each supplement has 290 kcal and 9 grams protein.  Reviewed full liquid diet with patient. Encouraged her to order items with protein at each meal (yogurt or milk) and to eat small, frequent meals as tolerated.  NUTRITION DIAGNOSIS:   Inadequate oral intake related to poor appetite, vomiting, nausea, other (see comment) (diarrhea) as evidenced by per patient/family report.  GOAL:   Patient will meet greater than or equal to 90% of their needs  MONITOR:   PO intake, Supplement acceptance, Diet advancement, Weight trends, Labs, I & O's  REASON FOR ASSESSMENT:   Malnutrition Screening Tool    ASSESSMENT:   Katie Robinson is a very pleasant 60 y.o. female with medical history significant of left-sided ulcerative colitis which was diagnosed in 95. In September 2017, due to ongoing diarrhea, underwent a repeat colonoscopy which demonstrated moderate proctosigmoiditis. Since that time she has had 36 lb weight loss, ongoing nausea, heaves, and diarrhea more than 20 times a day. Now being admitted for treatment of ulcerative colitis.   Patient reports that onset of symptoms occurred sometime in mid to late September. She has been having nausea, vomiting (bilious), abdominal pain, and diarrhea. Due to this, she has had poor appetite and very poor intake. Most days she has only been able to tolerate 1/2-1 can of soup, but reports several times she would go 2-3 days without eating anything this past month.  Reports UBW is 238 lbs and weight loss has occurred since about mid to late September. Patient has lost 36 lbs (15% body weight) in one month, which is significant for time  frame.  Patient does not like the Boost Breeze or Boost Plus because it is too sweet. 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   Medications reviewed and include: folic acid 1 mg daily, methylprednisolone 60 mg daily, multivitamin with minerals daily, Zofran 8 mg Q8hrs, thiamine 100 mg daily, D5-1/2NS with KCl 40 mEq/L @ 100 ml/hr (120 grams dextrose, 408 kcal daily).  Labs reviewed: Sodium 133, Potassium 4.1 (was previously low, but now s/p repletion), Chloride 97, Glucose 227.  Nutrition-Focused physical exam completed. Findings are no fat depletion, moderate muscle depletion, and no edema. Moderate losses of muscle mass are in upper body only - lower body appears well-nourished. Patient endorses she has become very weak with the significant weight loss and is unable to complete normal activities of daily living.  Patient meets criteria for severe acute malnutrition in setting of 15% weight loss in 1 month, intake </=50% of estimated energy requirement for >/= 5 days, moderate muscle depletion noted on nutrition-focused physical exam.  Discussed plan with RN.   Diet Order:  Diet full liquid Room service appropriate? Yes; Fluid consistency: Thin  Skin:  Reviewed, no issues  Last BM:  04/02/2016  Height:   Ht Readings from Last 1 Encounters:  04/02/16 5' 10.5" (1.791 m)    Weight:   Wt Readings from Last 1 Encounters:  04/02/16 202 lb (91.6 kg)    Ideal Body Weight:  69.32 kg  BMI:  Body mass index is 28.57 kg/m.  Estimated Nutritional Needs:   Kcal:  2300-2500 (25-27 kcal/kg)  Protein:  119-137 grams (1.3-1.5 grams/kg)  Fluid:  >/= 2.3 L/day  EDUCATION NEEDS:   Education needs addressed (Importance of adequate protein and calories through small, frequent meals. )  Willey Blade, MS, RD, LDN Pager: [phone] After Hours Pager: [phone]

## 2016-04-04 ENCOUNTER — Encounter (HOSPITAL_COMMUNITY): Payer: Self-pay | Admitting: *Deleted

## 2016-04-04 DIAGNOSIS — D649 Anemia, unspecified: Secondary | ICD-10-CM

## 2016-04-04 LAB — CBC
HCT: 30.6 % — ABNORMAL LOW (ref 36.0–46.0)
Hemoglobin: 9.6 g/dL — ABNORMAL LOW (ref 12.0–15.0)
MCH: 27.4 pg (ref 26.0–34.0)
MCHC: 31.4 g/dL (ref 30.0–36.0)
MCV: 87.2 fL (ref 78.0–100.0)
PLATELETS: 374 10*3/uL (ref 150–400)
RBC: 3.51 MIL/uL — ABNORMAL LOW (ref 3.87–5.11)
RDW: 13.8 % (ref 11.5–15.5)
WBC: 11.3 10*3/uL — ABNORMAL HIGH (ref 4.0–10.5)

## 2016-04-04 LAB — RENAL FUNCTION PANEL
ALBUMIN: 1.8 g/dL — AB (ref 3.5–5.0)
ANION GAP: 8 (ref 5–15)
BUN: 9 mg/dL (ref 6–20)
CALCIUM: 9.6 mg/dL (ref 8.9–10.3)
CO2: 28 mmol/L (ref 22–32)
Chloride: 102 mmol/L (ref 101–111)
Creatinine, Ser: 0.74 mg/dL (ref 0.44–1.00)
GFR calc Af Amer: >60 mL/min (ref >60)
GFR calc non Af Amer: >60 mL/min (ref >60)
GLUCOSE: 98 mg/dL (ref 65–99)
PHOSPHORUS: 2.1 mg/dL — AB (ref 2.5–4.6)
Potassium: 3.7 mmol/L (ref 3.5–5.1)
SODIUM: 138 mmol/L (ref 135–145)

## 2016-04-04 LAB — URINALYSIS, ROUTINE W REFLEX MICROSCOPIC
BILIRUBIN URINE: NEGATIVE
Glucose, UA: NEGATIVE mg/dL
Hgb urine dipstick: NEGATIVE
KETONES UR: NEGATIVE mg/dL
LEUKOCYTES UA: NEGATIVE
NITRITE: NEGATIVE
Protein, ur: NEGATIVE mg/dL
SPECIFIC GRAVITY, URINE: 1.013 (ref 1.005–1.030)
pH: 7 (ref 5.0–8.0)

## 2016-04-04 LAB — C-REACTIVE PROTEIN: CRP: 2 mg/dL — AB (ref <1.0)

## 2016-04-04 LAB — MAGNESIUM: Magnesium: 2 mg/dL (ref 1.7–2.4)

## 2016-04-04 MED ORDER — VITAMIN B-1 100 MG PO TABS
100.0000 mg | ORAL_TABLET | Freq: Every day | ORAL | Status: DC
  Administered 2016-04-05: 100 mg via ORAL
  Filled 2016-04-04 (×2): qty 1

## 2016-04-04 MED ORDER — SODIUM PHOSPHATES 45 MMOLE/15ML IV SOLN
30.0000 mmol | Freq: Once | INTRAVENOUS | Status: AC
  Administered 2016-04-04: 30 mmol via INTRAVENOUS
  Filled 2016-04-04: qty 10

## 2016-04-04 MED ORDER — FOLIC ACID 1 MG PO TABS
1.0000 mg | ORAL_TABLET | Freq: Every day | ORAL | Status: DC
  Administered 2016-04-05: 1 mg via ORAL
  Filled 2016-04-04 (×3): qty 1

## 2016-04-04 NOTE — Progress Notes (Signed)
PROGRESS NOTE  Katie Robinson  NIO:[id number] DOB: Dec 24, 1955 DOA: 04/02/2016 PCP: Crisoforo Oxford, PA-C  Brief Narrative:  Katie Robinson is a 60 y.o. female with medical history significant of left-sided ulcerative colitis who presents with frequent watery and bloody BMs, vomiting, and 36-lb weight loss.  Improving with steroids.  Assessment & Plan:   Active Problems:   Left sided ulcerative (chronic) colitis (HCC)   Bloody stools   Protein-calorie malnutrition, severe (HCC)   Generalized weakness   Ulcerative colitis (HCC)   Hypokalemia  Uncontrolled ulcerative colitis with abdominal pain, copious diarrhea, vomiting, inability to tolerate by mouth, bloody stools.  Decreasing diarrhea. -  Appreciate GI assistance -  C diff PCR negative -  Continue SoluMedrol IV 60 mg once daily -  Holding 6-MP 75 mg once daily until able to better tolerate PO -Dose of Humira is on October 30 -Scheduled Bentyl - Scheduled Zofran with Phenergan when necessary  Severe protein calorie malnutrition with weight loss -  Full liquid diet - Supplements - Nutrition consultation appreciated -  TSH wnl -  Continue thiamine and folate  Asthma, stable, continue when necessary albuterol  Hypokalemia, hypophosphatemia, likely secondary to vomiting and diarrhea, resolving -  IV phosphorus today  Generalized weakness -  PT recommending no follow up  Normocytic anemia, likely partly due to GI losses and poor nutrition over last month -  Iron studies, B12, folate -  TSH -  Repeat hgb in AM   DVT prophylaxis: lovenox  Code Status: full Family Communication: patient alone  Disposition Plan:  discharge to home once tolerating PO and diarrhea slowing down  Consultants:   Gastroenterology, Dr. Havery Moros  Procedures:  none  Antimicrobials:   none    Subjective: Eating better.  Had three episodes of bloody stools with vomiting overnight.  Objective: Vitals:   04/03/16 0604  04/03/16 1510 04/03/16 2031 04/04/16 0528  BP: 117/72 122/80 120/73 125/70  Pulse: 80 79 80 75  Resp: 17 18 18 16   Temp: 97.7 F (36.5 C) 98.5 F (36.9 C) 98 F (36.7 C) 97.8 F (36.6 C)  TempSrc: Oral Oral Oral Oral  SpO2: 96% 99% 97% 99%  Weight:      Height:        Intake/Output Summary (Last 24 hours) at 04/04/16 1224 Last data filed at 04/04/16 1036  Gross per 24 hour  Intake           623.25 ml  Output                0 ml  Net           623.25 ml   Filed Weights   04/02/16 1600  Weight: 91.6 kg (202 lb)    Examination:  General exam:  Adult female, cachectic.  No acute distress.  HEENT:  NCAT, MMM Respiratory system: Clear to auscultation bilaterally Cardiovascular system: Regular rate and rhythm, normal S1/S2. No murmurs, rubs, gallops or clicks.  Warm extremities Gastrointestinal system: Normal active bowel sounds, soft, nondistended, minimal TTP in the left lower quadrant and suprapubic area without rebound or guarding MSK:  Normal tone and bulk, no lower extremity edema Neuro:  Grossly intact    Data Reviewed: I have personally reviewed following labs and imaging studies  CBC:  Recent Labs Lab 04/02/16 1307 04/02/16 1740 04/04/16 0736  WBC 12.9* 12.0* 11.3*  NEUTROABS 10.3* 7.6  --   HGB 12.2 12.1 9.6*  HCT 36.5 36.2 30.6*  MCV 82.9  82.3 87.2  PLT 557.0* 480* 633   Basic Metabolic Panel:  Recent Labs Lab 04/02/16 1307 04/02/16 1740 04/03/16 0450 04/04/16 0522  NA 138 137 133* 138  K 2.9* 2.5* 4.1 3.7  CL 95* 96* 97* 102  CO2 36* 31 31 28   GLUCOSE 99 83 227* 98  BUN 11 12 10 9   CREATININE 0.74 0.76 0.63 0.74  CALCIUM 9.8 9.6 9.1 9.6  MG  --  1.8 2.4 2.0  PHOS  --  2.4* 3.8 2.1*   GFR: Estimated Creatinine Clearance: 93.8 mL/min (by C-G formula based on SCr of 0.74 mg/dL). Liver Function Tests:  Recent Labs Lab 04/02/16 1307 04/02/16 1740 04/03/16 0450 04/04/16 0522  AST 9 14*  --   --   ALT 8 13*  --   --   ALKPHOS 80 78   --   --   BILITOT 0.5 0.6  --   --   PROT 6.2 6.2*  --   --   ALBUMIN 2.5* 2.3* 1.8* 1.8*   No results for input(s): LIPASE, AMYLASE in the last 168 hours. No results for input(s): AMMONIA in the last 168 hours. Coagulation Profile: No results for input(s): INR, PROTIME in the last 168 hours. Cardiac Enzymes: No results for input(s): CKTOTAL, CKMB, CKMBINDEX, TROPONINI in the last 168 hours. BNP (last 3 results) No results for input(s): PROBNP in the last 8760 hours. HbA1C: No results for input(s): HGBA1C in the last 72 hours. CBG: No results for input(s): GLUCAP in the last 168 hours. Lipid Profile: No results for input(s): CHOL, HDL, LDLCALC, TRIG, CHOLHDL, LDLDIRECT in the last 72 hours. Thyroid Function Tests:  Recent Labs  04/02/16 1740  TSH 0.745   Anemia Panel: No results for input(s): VITAMINB12, FOLATE, FERRITIN, TIBC, IRON, RETICCTPCT in the last 72 hours. Urine analysis:    Component Value Date/Time   COLORURINE YELLOW 04/24/2015 2029   APPEARANCEUR CLOUDY (A) 04/24/2015 2029   LABSPEC 1.021 04/24/2015 2029   PHURINE 6.5 04/24/2015 2029   GLUCOSEU NEGATIVE 04/24/2015 2029   HGBUR NEGATIVE 04/24/2015 2029   BILIRUBINUR n 04/29/2015 1218   KETONESUR NEGATIVE 04/24/2015 2029   PROTEINUR 1 04/29/2015 1218   PROTEINUR NEGATIVE 04/24/2015 2029   UROBILINOGEN 0.2 04/29/2015 1218   UROBILINOGEN 1.0 04/24/2015 2029   NITRITE n 04/29/2015 1218   NITRITE POSITIVE (A) 04/24/2015 2029   LEUKOCYTESUR Negative 04/29/2015 1218   Sepsis Labs: @LABRCNTIP (procalcitonin:4,lacticidven:4)  ) Recent Results (from the past 240 hour(s))  C difficile quick scan w PCR reflex     Status: None   Collection Time: 04/02/16  4:00 PM  Result Value Ref Range Status   C Diff antigen NEGATIVE NEGATIVE Final   C Diff toxin NEGATIVE NEGATIVE Final   C Diff interpretation No C. difficile detected.  Final      Radiology Studies: No results found.   Scheduled Meds: . dicyclomine   10 mg Oral Q6H  . enoxaparin (LOVENOX) injection  40 mg Subcutaneous Q24H  . [START ON 35/45/6256] folic acid  1 mg Oral Daily  . methylPREDNISolone (SOLU-MEDROL) injection  60 mg Intravenous Daily  . multivitamin with minerals  1 tablet Oral Daily  . nystatin  5 mL Oral QID  . ondansetron (ZOFRAN) IV  8 mg Intravenous Q8H  . protein supplement shake  11 oz Oral BID BM  . sodium phosphate  Dextrose 5% IVPB  30 mmol Intravenous Once  . [START ON 04/05/2016] thiamine  100 mg Oral Daily  Continuous Infusions: . dextrose 5 % and 0.45 % NaCl with KCl 20 mEq/L 75 mL/hr at 04/03/16 1449     LOS: 2 days    Time spent: 30 min    Janece Canterbury, MD [facility] Pager [phone]  If 7PM-7AM, please contact night-coverage [email or web address] Password [email or web address] 04/04/2016, 12:24 PM

## 2016-04-04 NOTE — Progress Notes (Signed)
      Progress Note   Subjective  Patient continues to feel improved. She reports less frequent diarrhea, bleeding mostly has stopped. She continues to have some abdominal cramping which can be severe with her bowel movements. Overall she states she is heading in the right direction.    Objective   Vital signs in last 24 hours: Temp:  [97.8 F (36.6 C)-98.5 F (36.9 C)] 97.8 F (36.6 C) (10/21 0528) Pulse Rate:  [75-80] 75 (10/21 0528) Resp:  [16-18] 16 (10/21 0528) BP: (120-125)/(70-80) 125/70 (10/21 0528) SpO2:  [97 %-99 %] 99 % (10/21 0528) Last BM Date: 04/03/16 General:    AA female in NAD Heart:  Regular rate and rhythm; no murmurs Lungs: Respirations even and unlabored, lungs CTA bilaterally Abdomen:  Soft, mild left sided abdominal tenderness to palpation.  Extremities:  Without edema. Neurologic:  Alert and oriented,  grossly normal neurologically. Psych:  Cooperative. Normal mood and affect.  Intake/Output from previous day: 10/20 0701 - 10/21 0700 In: 508.3 [P.O.:240; I.V.:106.3; IV Piggyback:162] Out: -  Intake/Output this shift: No intake/output data recorded.  Lab Results:  Recent Labs  04/02/16 1307 04/02/16 1740 04/04/16 0736  WBC 12.9* 12.0* 11.3*  HGB 12.2 12.1 9.6*  HCT 36.5 36.2 30.6*  PLT 557.0* 480* 374   BMET  Recent Labs  04/02/16 1740 04/03/16 0450 04/04/16 0522  NA 137 133* 138  K 2.5* 4.1 3.7  CL 96* 97* 102  CO2 31 31 28   GLUCOSE 83 227* 98  BUN 12 10 9   CREATININE 0.76 0.63 0.74  CALCIUM 9.6 9.1 9.6   LFT  Recent Labs  04/02/16 1740  04/04/16 0522  PROT 6.2*  --   --   ALBUMIN 2.3*  < > 1.8*  AST 14*  --   --   ALT 13*  --   --   ALKPHOS 78  --   --   BILITOT 0.6  --   --   < > = values in this interval not displayed. PT/INR No results for input(s): LABPROT, INR in the last 72 hours.  Studies/Results: No results found.     Assessment / Plan:   60 y/o female with severe left sided UC, just started on  Humira as outpatient this week, as well as 6MP, who flared after she did not taper prednisone as instructed, admitted for further therapy. C diff negative.   Overall she feels significantly improved since admission on IV solumedrol, but still with some abdominal discomfort and loose stools. Hgb has downtrended. Will obtain CRP so we can trend this.   At this time recommend the following: - continue IV solumedrol today at 74m, will see how she does today, may consider transition to prednisone tomorrow - lovenox for DVT prophylaxis - holding off on 6MP while inpatient due to patient's nausea - trend CRP every few days.  Please call with questions.   SCarolina Cellar MD [facility] Pager [phone]

## 2016-04-04 NOTE — Progress Notes (Signed)
Key Points: Use following P&T approved IV to PO antibiotic change policy.  Description contains the criteria that are approved Note: Policy Excludes:  Esophagectomy patientsPHARMACIST - PHYSICIAN COMMUNICATION CONCERNING: IV to Oral Route Change Policy  RECOMMENDATION: This patient is receiving Thiamine and Folic Acid by the intravenous route.  Based on criteria approved by the Pharmacy and Therapeutics Committee, the intravenous medication(s) is/are being converted to the equivalent oral dose form(s).   DESCRIPTION: These criteria include:  The patient is eating (either orally or via tube) and/or has been taking other orally administered medications for a least 24 hours  The patient has no evidence of active gastrointestinal bleeding or impaired GI absorption (gastrectomy, short bowel, patient on TNA or NPO).  If you have questions about this conversion, please contact the Pharmacy Department  []   [phone] )  [facility] []   [phone] )  [facility] []   [phone] )  [facility] []   [phone] )  [facility] [x]   [phone] )  [place], [facility] 04/04/2016 10:31 AM

## 2016-04-05 ENCOUNTER — Encounter (HOSPITAL_COMMUNITY): Payer: Self-pay

## 2016-04-05 LAB — RENAL FUNCTION PANEL
ALBUMIN: 1.8 g/dL — AB (ref 3.5–5.0)
ANION GAP: 5 (ref 5–15)
BUN: 5 mg/dL — ABNORMAL LOW (ref 6–20)
CHLORIDE: 105 mmol/L (ref 101–111)
CO2: 27 mmol/L (ref 22–32)
Calcium: 9.2 mg/dL (ref 8.9–10.3)
Creatinine, Ser: 0.64 mg/dL (ref 0.44–1.00)
Glucose, Bld: 94 mg/dL (ref 65–99)
PHOSPHORUS: 2.4 mg/dL — AB (ref 2.5–4.6)
POTASSIUM: 3.9 mmol/L (ref 3.5–5.1)
Sodium: 137 mmol/L (ref 135–145)

## 2016-04-05 LAB — FOLATE: Folate: 7.9 ng/mL (ref >5.9)

## 2016-04-05 LAB — CBC
HEMATOCRIT: 30.5 % — AB (ref 36.0–46.0)
HEMOGLOBIN: 9.5 g/dL — AB (ref 12.0–15.0)
MCH: 27.4 pg (ref 26.0–34.0)
MCHC: 31.1 g/dL (ref 30.0–36.0)
MCV: 87.9 fL (ref 78.0–100.0)
Platelets: 380 10*3/uL (ref 150–400)
RBC: 3.47 MIL/uL — ABNORMAL LOW (ref 3.87–5.11)
RDW: 14.2 % (ref 11.5–15.5)
WBC: 9 10*3/uL (ref 4.0–10.5)

## 2016-04-05 LAB — IRON AND TIBC
IRON: 45 ug/dL (ref 28–170)
SATURATION RATIOS: 32 % — AB (ref 10.4–31.8)
TIBC: 141 ug/dL — AB (ref 250–450)
UIBC: 96 ug/dL

## 2016-04-05 LAB — FERRITIN: Ferritin: 68 ng/mL (ref 11–307)

## 2016-04-05 LAB — VITAMIN B12: Vitamin B-12: 3813 pg/mL — ABNORMAL HIGH (ref 180–914)

## 2016-04-05 LAB — MAGNESIUM: MAGNESIUM: 1.9 mg/dL (ref 1.7–2.4)

## 2016-04-05 MED ORDER — MAGNESIUM SULFATE IN D5W 1-5 GM/100ML-% IV SOLN
1.0000 g | Freq: Once | INTRAVENOUS | Status: AC
  Administered 2016-04-05: 1 g via INTRAVENOUS
  Filled 2016-04-05: qty 100

## 2016-04-05 MED ORDER — SODIUM PHOSPHATES 45 MMOLE/15ML IV SOLN
10.0000 mmol | Freq: Once | INTRAVENOUS | Status: AC
  Administered 2016-04-05: 10 mmol via INTRAVENOUS
  Filled 2016-04-05: qty 3.33

## 2016-04-05 NOTE — Progress Notes (Signed)
PROGRESS NOTE  [name]  IHW:[id number] DOB: 04-Jun-1956 DOA: 04/02/2016 PCP: Crisoforo Oxford, PA-C  Brief Narrative:  Katie Robinson is a 60 y.o. female with medical history significant of left-sided ulcerative colitis who presents with frequent watery and bloody BMs, vomiting, and 36-lb weight loss.  Improving with steroids.  Assessment & Plan:   Active Problems:   Left sided ulcerative (chronic) colitis (HCC)   Bloody stools   Protein-calorie malnutrition, severe (HCC)   Generalized weakness   Ulcerative colitis (HCC)   Hypokalemia   Normocytic anemia  Uncontrolled ulcerative colitis with abdominal pain, copious diarrhea, vomiting, inability to tolerate by mouth, bloody stools.  Decreasing diarrhea. -  Appreciate GI assistance -  C diff PCR negative -  Continue SoluMedrol IV 60 mg once daily through today and start prednisone tomorrow.   -  Holding 6-MP 75 mg once daily to outpatient setting -Dose of Humira is on October 30 -Scheduled Bentyl - Scheduled Zofran with Phenergan when necessary  Severe protein calorie malnutrition with weight loss -  regular diet - Supplements - Nutrition consultation appreciated -  TSH wnl -  Continue thiamine and folate  Asthma, stable, continue when necessary albuterol  Hypokalemia, hypophosphatemia, likely secondary to vomiting and diarrhea, resolving -  IV phosphorus agina today  Generalized weakness -  PT recommending no follow up  Normocytic anemia, likely partly due to GI losses and poor nutrition over last month -  vitamin B12 elevated, folate wnl, iron levels adequate -  TSH wnl -  Repeat hgb in AM   DVT prophylaxis: lovenox  Code Status: full Family Communication: patient alone  Disposition Plan:  discharge to home once tolerating PO and diarrhea slowing down  Consultants:   Gastroenterology, Dr. Havery Moros  Procedures:  none  Antimicrobials:   none    Subjective: Had three bloody stools  since yesterday, blood is darker.  Less vomiting.  Tolerated more food yesterday than she has in a long time.  Energy is better.  Abdominal pain is improving.    Objective: Vitals:   04/04/16 1326 04/04/16 2008 04/05/16 0521 04/05/16 1256  BP: 100/63 112/65 112/65 112/66  Pulse: 85 78 71 69  Resp: 16 16 16 16   Temp: 97.8 F (36.6 C) 98.4 F (36.9 C) 98 F (36.7 C) 98 F (36.7 C)  TempSrc: Oral Oral Oral Oral  SpO2: 95% 98% 98% 100%  Weight:      Height:        Intake/Output Summary (Last 24 hours) at 04/05/16 1403 Last data filed at 04/05/16 1256  Gross per 24 hour  Intake          3025.25 ml  Output              800 ml  Net          2225.25 ml   Filed Weights   04/02/16 1600  Weight: 91.6 kg (202 lb)    Examination:  General exam:  Adult female, cachectic.  No acute distress.  smiling HEENT:  NCAT, MMM, eyes are less sunken Respiratory system: Clear to auscultation bilaterally Cardiovascular system: Regular rate and rhythm, normal S1/S2. No murmurs, rubs, gallops or clicks.  Warm extremities Gastrointestinal system: Normal active bowel sounds, soft, nondistended, minimal TTP in the left lower quadrant and suprapubic area without rebound or guarding MSK:  Normal tone and bulk, no lower extremity edema Neuro:  Grossly intact    Data Reviewed: I have personally reviewed following labs and imaging  studies  CBC:  Recent Labs Lab 04/02/16 1307 04/02/16 1740 04/04/16 0736 04/05/16 0422  WBC 12.9* 12.0* 11.3* 9.0  NEUTROABS 10.3* 7.6  --   --   HGB 12.2 12.1 9.6* 9.5*  HCT 36.5 36.2 30.6* 30.5*  MCV 82.9 82.3 87.2 87.9  PLT 557.0* 480* 374 527   Basic Metabolic Panel:  Recent Labs Lab 04/02/16 1307 04/02/16 1740 04/03/16 0450 04/04/16 0522 04/05/16 0422  NA 138 137 133* 138 137  K 2.9* 2.5* 4.1 3.7 3.9  CL 95* 96* 97* 102 105  CO2 36* 31 31 28 27   GLUCOSE 99 83 227* 98 94  BUN 11 12 10 9  5*  CREATININE 0.74 0.76 0.63 0.74 0.64  CALCIUM 9.8 9.6 9.1  9.6 9.2  MG  --  1.8 2.4 2.0 1.9  PHOS  --  2.4* 3.8 2.1* 2.4*   GFR: Estimated Creatinine Clearance: 93.8 mL/min (by C-G formula based on SCr of 0.64 mg/dL). Liver Function Tests:  Recent Labs Lab 04/02/16 1307 04/02/16 1740 04/03/16 0450 04/04/16 0522 04/05/16 0422  AST 9 14*  --   --   --   ALT 8 13*  --   --   --   ALKPHOS 80 78  --   --   --   BILITOT 0.5 0.6  --   --   --   PROT 6.2 6.2*  --   --   --   ALBUMIN 2.5* 2.3* 1.8* 1.8* 1.8*   No results for input(s): LIPASE, AMYLASE in the last 168 hours. No results for input(s): AMMONIA in the last 168 hours. Coagulation Profile: No results for input(s): INR, PROTIME in the last 168 hours. Cardiac Enzymes: No results for input(s): CKTOTAL, CKMB, CKMBINDEX, TROPONINI in the last 168 hours. BNP (last 3 results) No results for input(s): PROBNP in the last 8760 hours. HbA1C: No results for input(s): HGBA1C in the last 72 hours. CBG: No results for input(s): GLUCAP in the last 168 hours. Lipid Profile: No results for input(s): CHOL, HDL, LDLCALC, TRIG, CHOLHDL, LDLDIRECT in the last 72 hours. Thyroid Function Tests:  Recent Labs  04/02/16 1740  TSH 0.745   Anemia Panel:  Recent Labs  04/05/16 0422  VITAMINB12 3,813*  FOLATE 7.9  FERRITIN 68  TIBC 141*  IRON 45   Urine analysis:    Component Value Date/Time   COLORURINE YELLOW 04/04/2016 1552   APPEARANCEUR HAZY (A) 04/04/2016 1552   LABSPEC 1.013 04/04/2016 1552   PHURINE 7.0 04/04/2016 1552   GLUCOSEU NEGATIVE 04/04/2016 1552   HGBUR NEGATIVE 04/04/2016 1552   BILIRUBINUR NEGATIVE 04/04/2016 1552   BILIRUBINUR n 04/29/2015 1218   KETONESUR NEGATIVE 04/04/2016 1552   PROTEINUR NEGATIVE 04/04/2016 1552   UROBILINOGEN 0.2 04/29/2015 1218   UROBILINOGEN 1.0 04/24/2015 2029   NITRITE NEGATIVE 04/04/2016 1552   LEUKOCYTESUR NEGATIVE 04/04/2016 1552   Sepsis Labs: @LABRCNTIP (procalcitonin:4,lacticidven:4)  ) Recent Results (from the past 240 hour(s))   C difficile quick scan w PCR reflex     Status: None   Collection Time: 04/02/16  4:00 PM  Result Value Ref Range Status   C Diff antigen NEGATIVE NEGATIVE Final   C Diff toxin NEGATIVE NEGATIVE Final   C Diff interpretation No C. difficile detected.  Final      Radiology Studies: No results found.   Scheduled Meds: . dicyclomine  10 mg Oral Q6H  . enoxaparin (LOVENOX) injection  40 mg Subcutaneous Q24H  . folic acid  1 mg Oral  Daily  . methylPREDNISolone (SOLU-MEDROL) injection  60 mg Intravenous Daily  . multivitamin with minerals  1 tablet Oral Daily  . nystatin  5 mL Oral QID  . ondansetron (ZOFRAN) IV  8 mg Intravenous Q8H  . protein supplement shake  11 oz Oral BID BM  . sodium phosphate  Dextrose 5% IVPB  10 mmol Intravenous Once  . thiamine  100 mg Oral Daily   Continuous Infusions: . dextrose 5 % and 0.45 % NaCl with KCl 20 mEq/L 75 mL/hr at 04/03/16 1449     LOS: 3 days    Time spent: 30 min    Janece Canterbury, MD [facility] Pager [phone]  If 7PM-7AM, please contact night-coverage [email or web address] Password [email or web address] 04/05/2016, 2:03 PM

## 2016-04-05 NOTE — Progress Notes (Signed)
      Progress Note   Subjective  Patient reports significant improvement from admission, although still with some loose stools. Prior to admission had > 20 BMs per day, had 8 or 9 yesterday. Abdominal pain  Improved. One episode of vomiting yesterday.   Objective   Vital signs in last 24 hours: Temp:  [97.8 F (36.6 C)-98.4 F (36.9 C)] 98 F (36.7 C) (10/22 0521) Pulse Rate:  [71-85] 71 (10/22 0521) Resp:  [16] 16 (10/22 0521) BP: (100-112)/(63-65) 112/65 (10/22 0521) SpO2:  [95 %-98 %] 98 % (10/22 0521) Last BM Date: 04/04/16 General:    AA female in NAD Heart:  Regular rate and rhythm; no murmurs Lungs: Respirations even and unlabored, lungs CTA bilaterally Abdomen:  Soft, nontender and nondistended.  Extremities:  Without edema. Neurologic:  Alert and oriented,  grossly normal neurologically. Psych:  Cooperative. Normal mood and affect.  Intake/Output from previous day: 10/21 0701 - 10/22 0700 In: 1669 [P.O.:755; I.V.:600; IV Piggyback:314] Out: 800 [Urine:800] Intake/Output this shift: No intake/output data recorded.  Lab Results:  Recent Labs  04/02/16 1740 04/04/16 0736 04/05/16 0422  WBC 12.0* 11.3* 9.0  HGB 12.1 9.6* 9.5*  HCT 36.2 30.6* 30.5*  PLT 480* 374 380   BMET  Recent Labs  04/03/16 0450 04/04/16 0522 04/05/16 0422  NA 133* 138 137  K 4.1 3.7 3.9  CL 97* 102 105  CO2 31 28 27   GLUCOSE 227* 98 94  BUN 10 9 5*  CREATININE 0.63 0.74 0.64  CALCIUM 9.1 9.6 9.2   LFT  Recent Labs  04/02/16 1740  04/05/16 0422  PROT 6.2*  --   --   ALBUMIN 2.3*  < > 1.8*  AST 14*  --   --   ALT 13*  --   --   ALKPHOS 78  --   --   BILITOT 0.6  --   --   < > = values in this interval not displayed. PT/INR No results for input(s): LABPROT, INR in the last 72 hours.  Studies/Results: No results found.     Assessment / Plan:   60 y/o female with severe left sided UC, just started on Humira as outpatient this week, as well as 6MP, who flared  after she did not taper prednisone as instructed (stopped prematurely), admitted for further therapy. C diff negative.   Overall she feels significantly improved since admission on IV solumedrol, CRP is only 2, and is generally making nice progress. She had 8-9 BMs yesterday, improved from > 20 on admission, but I think she warrants an additional day of IV steroids. We can advance her diet to regular today. I anticipate transitioning to oral prednisone tomorrow and perhaps discharge later tomorrow afternoon or Tuesday pending her course.   At this time recommend the following: - continue IV solumedrol today at 59m for another day, anticipated transitioning to prednisone 466m/ day tomorrow - lovenox for DVT prophylaxis - holding off on 6MP while inpatient due to patient's nausea, can resume as outpatient - will continue Humira as outpatient (only received one dose thus far)  Dr. DaLoletha Carrowo assume the GI service tomorrow.  Please call with questions.   StCarolina CellarMD [facility] Pager [phone]

## 2016-04-06 ENCOUNTER — Encounter (HOSPITAL_COMMUNITY): Payer: Self-pay | Admitting: *Deleted

## 2016-04-06 DIAGNOSIS — R1084 Generalized abdominal pain: Secondary | ICD-10-CM

## 2016-04-06 DIAGNOSIS — R197 Diarrhea, unspecified: Secondary | ICD-10-CM

## 2016-04-06 LAB — MAGNESIUM: Magnesium: 1.9 mg/dL (ref 1.7–2.4)

## 2016-04-06 LAB — C-REACTIVE PROTEIN: CRP: <0.8 mg/dL (ref <1.0)

## 2016-04-06 LAB — RENAL FUNCTION PANEL
ANION GAP: 4 — AB (ref 5–15)
Albumin: 1.7 g/dL — ABNORMAL LOW (ref 3.5–5.0)
BUN: 8 mg/dL (ref 6–20)
CALCIUM: 8.9 mg/dL (ref 8.9–10.3)
CO2: 28 mmol/L (ref 22–32)
Chloride: 107 mmol/L (ref 101–111)
Creatinine, Ser: 0.72 mg/dL (ref 0.44–1.00)
GFR calc Af Amer: >60 mL/min (ref >60)
GFR calc non Af Amer: >60 mL/min (ref >60)
GLUCOSE: 108 mg/dL — AB (ref 65–99)
Phosphorus: 2.4 mg/dL — ABNORMAL LOW (ref 2.5–4.6)
Potassium: 4.2 mmol/L (ref 3.5–5.1)
SODIUM: 139 mmol/L (ref 135–145)

## 2016-04-06 LAB — CBC
HCT: 29.7 % — ABNORMAL LOW (ref 36.0–46.0)
HEMOGLOBIN: 9.2 g/dL — AB (ref 12.0–15.0)
MCH: 27.4 pg (ref 26.0–34.0)
MCHC: 31 g/dL (ref 30.0–36.0)
MCV: 88.4 fL (ref 78.0–100.0)
PLATELETS: 334 10*3/uL (ref 150–400)
RBC: 3.36 MIL/uL — AB (ref 3.87–5.11)
RDW: 14.4 % (ref 11.5–15.5)
WBC: 8.4 10*3/uL (ref 4.0–10.5)

## 2016-04-06 MED ORDER — PREDNISONE 20 MG PO TABS
40.0000 mg | ORAL_TABLET | Freq: Every day | ORAL | Status: DC
  Administered 2016-04-06: 40 mg via ORAL
  Filled 2016-04-06: qty 2

## 2016-04-06 MED ORDER — PREDNISONE 10 MG PO TABS: ORAL_TABLET | ORAL | 0 refills | Status: DC

## 2016-04-06 MED ORDER — ONDANSETRON HCL 4 MG PO TABS: 4.0000 mg | ORAL_TABLET | Freq: Three times a day (TID) | ORAL | 0 refills | Status: DC | PRN

## 2016-04-06 MED ORDER — PROMETHAZINE HCL 12.5 MG PO TABS: 12.5000 mg | ORAL_TABLET | Freq: Four times a day (QID) | ORAL | 0 refills | Status: DC | PRN

## 2016-04-06 MED ORDER — PREDNISONE 20 MG PO TABS: 60.0000 mg | ORAL_TABLET | Freq: Every day | ORAL | Status: DC

## 2016-04-06 MED ORDER — SODIUM PHOSPHATES 45 MMOLE/15ML IV SOLN
10.0000 mmol | Freq: Once | INTRAVENOUS | Status: DC
  Filled 2016-04-06: qty 3.33

## 2016-04-06 MED ORDER — DICYCLOMINE HCL 10 MG PO CAPS
10.0000 mg | ORAL_CAPSULE | Freq: Four times a day (QID) | ORAL | 0 refills | Status: DC
Start: 2016-04-06 — End: 2016-05-19

## 2016-04-06 NOTE — Progress Notes (Signed)
Discharge instructions explained to pt, she states understanding. Told prescriptions called into her pharmacy. Daughter in to pick mother up and bring her home. Pt discharged via wheelchair.

## 2016-04-06 NOTE — Progress Notes (Signed)
[facility] Progress Note  Chief Complaint: ulcerative colitis  Subjective  History:  Her generalized abd pain, hematochezia and diarrhea have all greatly decreased on IV steroids over the last few days.  Her general "sickness feeling" has improved.  Appetite still fair.  ROS: Cardiovascular:  no chest pain Respiratory: no dyspnea  Objective:  Med list reviewed  Vital signs in last 24 hrs: Vitals:   04/05/16 2048 04/06/16 0543  BP: 129/76 126/75  Pulse: 83 70  Resp: 16 16  Temp: 99 F (37.2 C) 98.5 F (36.9 C)    Physical Exam   HEENT: sclera anicteric, oral mucosa moist without lesions  Neck: supple, no thyromegaly, JVD or lymphadenopathy  Cardiac: RRR without murmurs, S1S2 heard, no peripheral edema  Pulm: clear to auscultation bilaterally, normal RR and effort noted  Abdomen: soft, mild lower tenderness, with active bowel sounds. No guarding or palpable hepatosplenomegaly  Skin; warm and dry, no jaundice or rash  Recent Labs:   Recent Labs Lab 04/04/16 0736 04/05/16 0422 04/06/16 0346  WBC 11.3* 9.0 8.4  HGB 9.6* 9.5* 9.2*  HCT 30.6* 30.5* 29.7*  PLT 374 380 334    Recent Labs Lab 04/02/16 1740  04/06/16 0346  NA 137  < > 139  K 2.5*  < > 4.2  CL 96*  < > 107  CO2 31  < > 28  BUN 12  < > 8  ALBUMIN 2.3*  < > 1.7*  ALKPHOS 78  --   --   ALT 13*  --   --   AST 14*  --   --   GLUCOSE 83  < > 108*  < > = values in this interval not displayed. No results for input(s): INR in the last 168 hours.    @ASSESSMENTPLANBEGIN @ Assessment: Left sided UC with flare  - pain, diarrhea, hematochezia.  All much better on IV steroids.  Prednisone 40 mg once daily resumed today.  Plan: If stable tomorrow, discharge home.  However, I have increased the prednisone dose to 60 mg once daily.  She should take that for a week, then down to 50 mg once daily, and will receive further instructions from our office re: taper and follow up plans with Dr Carlean Purl.   Humira plans will also come with that.   Nelida Meuse III Pager [phone] Mon-Fri 8a-5p [phone] after 5p, weekends, holidays

## 2016-04-06 NOTE — Discharge Summary (Signed)
Physician Discharge Summary  Katie Robinson [id number]:[id number] DOB: [birth year] DOA: 04/02/2016  PCP: Crisoforo Oxford, PA-C  Admit date: 04/02/2016 Discharge date: 04/06/2016  Admitted From: home  Disposition:  home  Recommendations for Outpatient Follow-up:  1. Follow up with Dr. Carlean Purl.  Office will call for appointment.   2. Given prednisone taper:  Prednisone 75m daily x 1 week followed by 541mdaily 3. CBC and BMP, Magnesium to follow up anemia and electrolytes at next visit  Home Health:  none  Equipment/Devices:  None  Discharge Condition:  Stable, improved CODE STATUS:  full  Diet recommendation:  regular   Brief/Interim Summary:  Katie Robinson [age].o.femalewith medical history significant of left-sided ulcerative colitis who presented with frequent watery and bloody BMs, vomiting, and 36-lb weight loss.  She was started on IV solumedrol and had gradual improvement in her abdominal pain and diarrhea.  She was able to tolerate a regular diet and maintain hydration prior to discharge.  Her diarrhea slowed to 5 bowel movements per day with decreased blood in her BMs.  Her electrolytes were replaced but she will need repeat labs at her next visit to ensure they remain stable or improved.    Discharge Diagnoses:  Principal Problem:   Left sided ulcerative (chronic) colitis (HCC) Active Problems:   Bloody stools   Protein-calorie malnutrition, severe (HCC)   Generalized weakness   Ulcerative colitis (HCC)   Hypokalemia   Normocytic anemia  Uncontrolled ulcerative colitis with abdominal pain, copious diarrhea, vomiting, inability to tolerate by mouth, bloody stools.  Decreasing diarrhea.   - Appreciate GI assistance - C diff PCR negative   -  Holding 6-MP 75 mg once daily to outpatient setting -  Next dose of Humira is on October 30, second loading dose. -  Continue Bentyl, zofran, and phenergan at home for symptom management  Severe protein calorie  malnutrition with weight loss - Regular diet - Supplements - Nutrition was consulted - TSH wnl -  Continue thiamine and folate  Asthma, stable, continue when necessary albuterol  Hypokalemia, hypophosphatemia, likely secondary to vomiting and diarrhea, resolving.  She was given IV and oral repletion.    Generalized weakness - PT recommended no follow up  Normocytic anemia, likely partly due to GI losses and poor nutrition over last month and inflammation from IBD -  vitamin B12 elevated, folate wnl, iron levels adequate -  TSH wnl   Discharge Instructions  Discharge Instructions    Call MD for:  difficulty breathing, headache or visual disturbances    Complete by:  As directed    Call MD for:  extreme fatigue    Complete by:  As directed    Call MD for:  hives    Complete by:  As directed    Call MD for:  persistant dizziness or light-headedness    Complete by:  As directed    Call MD for:  persistant nausea and vomiting    Complete by:  As directed    Call MD for:  severe uncontrolled pain    Complete by:  As directed    Call MD for:  temperature >100.4    Complete by:  As directed    Diet general    Complete by:  As directed    Discharge instructions    Complete by:  As directed    Take your second loading dose of Humira on 10/30.  Take prednisone 1069mabs, 6 tabs daily through Sunday, then start  taking 5 tabs daily starting on 10/30.   Increase activity slowly    Complete by:  As directed        Medication List    STOP taking these medications   diphenoxylate-atropine 2.5-0.025 MG tablet Commonly known as:  LOMOTIL   mercaptopurine 50 MG tablet Commonly known as:  PURINETHOL     TAKE these medications   Adalimumab 40 MG/0.8ML Pskt Commonly known as:  HUMIRA Inject 0.8 mLs (40 mg total) into the skin every 14 (fourteen) days.   Adalimumab 40 MG/0.8ML Pnkt Commonly known as:  HUMIRA PEN-CROHNS STARTER Inject 160 mg into the skin once. On day 0,  80 mg on day 15   albuterol (2.5 MG/3ML) 0.083% nebulizer solution Commonly known as:  PROVENTIL Take 3 mLs (2.5 mg total) by nebulization every 6 (six) hours as needed for wheezing or shortness of breath.   dicyclomine 10 MG capsule Commonly known as:  BENTYL Take 1 capsule (10 mg total) by mouth every 6 (six) hours.   ondansetron 4 MG tablet Commonly known as:  ZOFRAN Take 1 tablet (4 mg total) by mouth every 8 (eight) hours as needed for nausea or vomiting.   predniSONE 10 MG tablet Commonly known as:  DELTASONE Take 6 tabs daily x 7 days, then take 5 tabs daily thereafter   promethazine 12.5 MG tablet Commonly known as:  PHENERGAN Take 1 tablet (12.5 mg total) by mouth every 6 (six) hours as needed for nausea or vomiting.   Vitamin D (Cholecalciferol) 1000 units Caps Take 1 capsule by mouth daily.      Follow-up Information    TYSINGER, DAVID [place], PA-C .   Specialty:  Family Medicine Contact information: [place] [address] [phone]        Silvano Rusk, MD Follow up in 2 week(s).   Specialty:  Gastroenterology Why:  office will call you with appointment time and date Contact information: [address] [place] [address] [phone]          No Known Allergies  Consultations: Gastroenterology, Dr. Carlean Purl patient   Procedures/Studies: No results found.   Subjective: Feeling better.  Abdominal pain mostly resolved.  5 BMs in last 24 hours with less blood.  Energy is better and ate better in last 24 hours than she has all month.    Discharge Exam: Vitals:   04/05/16 2048 04/06/16 0543  BP: 129/76 126/75  Pulse: 83 70  Resp: 16 16  Temp: 99 F (37.2 C) 98.5 F (36.9 C)   Vitals:   04/05/16 0521 04/05/16 1256 04/05/16 2048 04/06/16 0543  BP: 112/65 112/66 129/76 126/75  Pulse: 71 69 83 70  Resp: 16 16 16 16   Temp: 98 F (36.7 C) 98 F (36.7 C) 99 F (37.2 C) 98.5 F (36.9 C)  TempSrc: Oral Oral Oral  Oral  SpO2: 98% 100% 98% 98%  Weight:      Height:        General exam:  Adult female, cachectic.  No acute distress.  smiling HEENT:  NCAT, MMM Respiratory system: Clear to auscultation bilaterally Cardiovascular system: Regular rate and rhythm, normal S1/S2. No murmurs, rubs, gallops or clicks.  Warm extremities Gastrointestinal system: Normal active bowel sounds, soft, nondistended, minimal TTP in the left lower quadrant and suprapubic area without rebound or guarding MSK:  Normal tone and bulk, no lower extremity edema Neuro:  Grossly intact   The results of significant diagnostics from this hospitalization (including imaging, microbiology,  ancillary and laboratory) are listed below for reference.     Microbiology: Recent Results (from the past 240 hour(s))  C difficile quick scan w PCR reflex     Status: None   Collection Time: 04/02/16  4:00 PM  Result Value Ref Range Status   C Diff antigen NEGATIVE NEGATIVE Final   C Diff toxin NEGATIVE NEGATIVE Final   C Diff interpretation No C. difficile detected.  Final     Labs: BNP (last 3 results) No results for input(s): BNP in the last 8760 hours. Basic Metabolic Panel:  Recent Labs Lab 04/02/16 1740 04/03/16 0450 04/04/16 0522 04/05/16 0422 04/06/16 0346  NA 137 133* 138 137 139  K 2.5* 4.1 3.7 3.9 4.2  CL 96* 97* 102 105 107  CO2 31 31 28 27 28   GLUCOSE 83 227* 98 94 108*  BUN 12 10 9  5* 8  CREATININE 0.76 0.63 0.74 0.64 0.72  CALCIUM 9.6 9.1 9.6 9.2 8.9  MG 1.8 2.4 2.0 1.9 1.9  PHOS 2.4* 3.8 2.1* 2.4* 2.4*   Liver Function Tests:  Recent Labs Lab 04/02/16 1307 04/02/16 1740 04/03/16 0450 04/04/16 0522 04/05/16 0422 04/06/16 0346  AST 9 14*  --   --   --   --   ALT 8 13*  --   --   --   --   ALKPHOS 80 78  --   --   --   --   BILITOT 0.5 0.6  --   --   --   --   PROT 6.2 6.2*  --   --   --   --   ALBUMIN 2.5* 2.3* 1.8* 1.8* 1.8* 1.7*   No results for input(s): LIPASE, AMYLASE in the last 168  hours. No results for input(s): AMMONIA in the last 168 hours. CBC:  Recent Labs Lab 04/02/16 1307 04/02/16 1740 04/04/16 0736 04/05/16 0422 04/06/16 0346  WBC 12.9* 12.0* 11.3* 9.0 8.4  NEUTROABS 10.3* 7.6  --   --   --   HGB 12.2 12.1 9.6* 9.5* 9.2*  HCT 36.5 36.2 30.6* 30.5* 29.7*  MCV 82.9 82.3 87.2 87.9 88.4  PLT 557.0* 480* 374 380 334   Cardiac Enzymes: No results for input(s): CKTOTAL, CKMB, CKMBINDEX, TROPONINI in the last 168 hours. BNP: Invalid input(s): POCBNP CBG: No results for input(s): GLUCAP in the last 168 hours. D-Dimer No results for input(s): DDIMER in the last 72 hours. Hgb A1c No results for input(s): HGBA1C in the last 72 hours. Lipid Profile No results for input(s): CHOL, HDL, LDLCALC, TRIG, CHOLHDL, LDLDIRECT in the last 72 hours. Thyroid function studies No results for input(s): TSH, T4TOTAL, T3FREE, THYROIDAB in the last 72 hours.  Invalid input(s): FREET3 Anemia work up  Recent Labs  04/05/16 0422  VITAMINB12 3,813*  FOLATE 7.9  FERRITIN 68  TIBC 141*  IRON 45   Urinalysis    Component Value Date/Time   COLORURINE YELLOW 04/04/2016 1552   APPEARANCEUR HAZY (A) 04/04/2016 1552   LABSPEC 1.013 04/04/2016 1552   PHURINE 7.0 04/04/2016 1552   GLUCOSEU NEGATIVE 04/04/2016 1552   HGBUR NEGATIVE 04/04/2016 1552   BILIRUBINUR NEGATIVE 04/04/2016 1552   BILIRUBINUR n 04/29/2015 [place] 04/04/2016 1552   PROTEINUR NEGATIVE 04/04/2016 1552   UROBILINOGEN 0.2 04/29/2015 1218   UROBILINOGEN 1.0 04/24/2015 2029   NITRITE NEGATIVE 04/04/2016 1552   LEUKOCYTESUR NEGATIVE 04/04/2016 1552   Sepsis Labs Invalid input(s): PROCALCITONIN,  WBC,  LACTICIDVEN  Time coordinating discharge: Over 30 minutes  SIGNED:   Janece Canterbury, MD  [facility] 04/06/2016, 12:41 PM Pager   If 7PM-7AM, please contact night-coverage [email or web address] Password [email or web address]

## 2016-04-07 ENCOUNTER — Telehealth: Payer: Self-pay

## 2016-04-07 DIAGNOSIS — K51018 Ulcerative (chronic) pancolitis with other complication: Secondary | ICD-10-CM

## 2016-04-07 NOTE — Telephone Encounter (Signed)
-----   Message from Gatha Mayer, MD sent at 04/06/2016  5:07 PM EDT ----- Have her do a CMET and Mg level Monday next week  Out of work untli 11/8 appt w/ me  Take Humira as planned Oct 30   ----- Message ----- From: Doran Stabler, MD Sent: 04/06/2016  12:15 PM To: Gatha Mayer, MD  [facility] is sending this woman home today.  She will be on prednisone 60 mg once daily.  Feeling much better.  She will need a call from your nurse this week with plan for taper and when she should take her next Humira.  - HD

## 2016-04-07 NOTE — Telephone Encounter (Signed)
Patient notified of recommendations She will come for labs on Monday She verbalized understanding to continue Humira injections on 04/13/16. Letter placed out front.

## 2016-04-08 ENCOUNTER — Telehealth: Payer: Self-pay | Admitting: Internal Medicine

## 2016-04-08 MED ORDER — PREDNISONE 10 MG PO TABS: ORAL_TABLET | ORAL | 0 refills | Status: DC

## 2016-04-08 NOTE — Telephone Encounter (Signed)
Reviewed her discharge medications with her.  All questions answered.  She will call back for any additional questions or concerns,.

## 2016-04-13 ENCOUNTER — Other Ambulatory Visit (INDEPENDENT_AMBULATORY_CARE_PROVIDER_SITE_OTHER): Payer: 59

## 2016-04-13 DIAGNOSIS — K51018 Ulcerative (chronic) pancolitis with other complication: Secondary | ICD-10-CM | POA: Diagnosis not present

## 2016-04-13 LAB — MAGNESIUM: Magnesium: 1.8 mg/dL (ref 1.5–2.5)

## 2016-04-13 LAB — COMPREHENSIVE METABOLIC PANEL
ALT: 15 U/L (ref 0–35)
AST: 13 U/L (ref 0–37)
Albumin: 2.5 g/dL — ABNORMAL LOW (ref 3.5–5.2)
Alkaline Phosphatase: 72 U/L (ref 39–117)
BUN: 9 mg/dL (ref 6–23)
CHLORIDE: 107 meq/L (ref 96–112)
CO2: 26 meq/L (ref 19–32)
Calcium: 8.6 mg/dL (ref 8.4–10.5)
Creatinine, Ser: 0.66 mg/dL (ref 0.40–1.20)
GFR: 117.8 mL/min (ref >60.00)
GLUCOSE: 103 mg/dL — AB (ref 70–99)
POTASSIUM: 3.5 meq/L (ref 3.5–5.1)
Sodium: 142 mEq/L (ref 135–145)
Total Bilirubin: 0.3 mg/dL (ref 0.2–1.2)
Total Protein: 5.1 g/dL — ABNORMAL LOW (ref 6.0–8.3)

## 2016-04-13 NOTE — Progress Notes (Signed)
Electrolytes and chemistries normal or better I hope she is feeling better Stay on current Rx and keep f/u

## 2016-04-22 ENCOUNTER — Encounter: Payer: Self-pay | Admitting: Internal Medicine

## 2016-04-22 ENCOUNTER — Ambulatory Visit (INDEPENDENT_AMBULATORY_CARE_PROVIDER_SITE_OTHER): Payer: 59 | Admitting: Internal Medicine

## 2016-04-22 VITALS — BP 116/74 | HR 92 | Ht 70.25 in | Wt 231.1 lb

## 2016-04-22 DIAGNOSIS — K5150 Left sided colitis without complications: Secondary | ICD-10-CM | POA: Diagnosis not present

## 2016-04-22 DIAGNOSIS — R609 Edema, unspecified: Secondary | ICD-10-CM | POA: Diagnosis not present

## 2016-04-22 DIAGNOSIS — E43 Unspecified severe protein-calorie malnutrition: Secondary | ICD-10-CM

## 2016-04-22 DIAGNOSIS — Z23 Encounter for immunization: Secondary | ICD-10-CM | POA: Diagnosis not present

## 2016-04-22 MED ORDER — HYDROCHLOROTHIAZIDE 12.5 MG PO CAPS: 12.5000 mg | ORAL_CAPSULE | Freq: Every day | ORAL | 0 refills | Status: DC | PRN

## 2016-04-22 NOTE — Patient Instructions (Addendum)
  Take prednisone 53m daily for 2 weeks then if doing ok go to 230mdaily until your next visit.   Prior to your next office visit come get lab work drawn, no appointment needed.  The lab is open 7:30AM-5:30PM.    We have sent the following medications to your pharmacy for you to pick up at your convenience: HCTZ   Today we are giving you a flu shot and an information sheet about it to read.    Take your lomotil as needed.     Stay out of work for now.   We will see you back 05/19/16.  [facility] next Humira shots so we can get the proper pens you need.     I appreciate the opportunity to care for you. CaSilvano RuskMD, FAPearl Surgicenter Inc

## 2016-04-22 NOTE — Progress Notes (Signed)
   Katie Robinson 60 y.o. 05/02/1956 [id number]  Assessment & Plan:   1. Left sided ulcerative (chronic) colitis ([place])   2. Severe protein-calorie malnutrition ([place])   3. Peripheral edema   4. Need for prophylactic vaccination and inoculation against influenza    Overall significantly improved but still at least moderately ill. Edema is am low albumin associated with protein losses related to the ulcerative colitis and malnutrition. I think it's reasonable trial little bit of low-dose HCTZ for that. Intermittently only she was cautioned about overuse of this. Elevate legs as well.  He will reduce prednisone to 30 mg daily for 2 weeks and then 20 mg daily until she sees me again in about a month overall. She can call back in between if issues or questions.  Lomotil as needed  CBC CMET  prior to visit in 1 month  Influenza vaccine today  I don't think she is ready for work yet, we'll reassess in a month if she feels ready to go sooner we can talk  Change to Humira pens   We reviewed that she should avoid live vaccines  Subjective:   Chief Complaint: Follow-up ulcerative colitis  HPI Patient is a pleasant middle-aged African-American woman with left-sided ulcerative colitis who had a severe flare and was hospitalized last month. She is significantly better at this time having about 50% of the volume of stools she was having. No about 9 a day maximum. Nocturnal defecation as well though stools are starting to form up a little bit. She has had her to starting Humira injections of 4 injections and then 2 injections and is due for her first single 40 mg injection next week. Unfortunately she got syringes rather than pens though the Humira ambassador is going to help her. Denies bleeding. She is having peripheral edema. Appetite is back.  Medications, allergies, past medical history, past surgical history, family history and social history are reviewed and updated in the EMR.  Review  of Systems Wt Readings from Last 3 Encounters:  04/22/16 231 lb 2 oz (104.8 kg)  04/02/16 202 lb (91.6 kg)  04/02/16 202 lb 9.6 oz (91.9 kg)   Still weak but Improved  Objective:   Physical Exam @BP  116/74 (BP Location: Left Arm, Patient Position: Sitting, Cuff Size: Normal)   Pulse 92   Ht 5' 10.25" (1.784 m)   Wt 231 lb 2 oz (104.8 kg)   BMI 32.93 kg/m @  General:  NAD Eyes:   anicteric Lungs:  clear Heart::  S1S2 no rubs, murmurs or gallops Abdomen:  soft and nontender, BS+ Ext:   1+ ankle edema after greater than right,  no cyanosis or clubbing    Data Reviewed:  Hospital records, reviewed her last colonoscopy,  labs in the computer

## 2016-05-15 ENCOUNTER — Other Ambulatory Visit (INDEPENDENT_AMBULATORY_CARE_PROVIDER_SITE_OTHER): Payer: 59

## 2016-05-15 DIAGNOSIS — D84821 Immunodeficiency due to drugs: Secondary | ICD-10-CM

## 2016-05-15 DIAGNOSIS — Z79899 Other long term (current) drug therapy: Secondary | ICD-10-CM | POA: Diagnosis not present

## 2016-05-15 LAB — COMPREHENSIVE METABOLIC PANEL
ALK PHOS: 64 U/L (ref 39–117)
ALT: 9 U/L (ref 0–35)
AST: 8 U/L (ref 0–37)
Albumin: 3.6 g/dL (ref 3.5–5.2)
BUN: 9 mg/dL (ref 6–23)
CO2: 24 mEq/L (ref 19–32)
Calcium: 9.9 mg/dL (ref 8.4–10.5)
Chloride: 109 mEq/L (ref 96–112)
Creatinine, Ser: 0.86 mg/dL (ref 0.40–1.20)
GFR: 86.77 mL/min (ref >60.00)
GLUCOSE: 102 mg/dL — AB (ref 70–99)
POTASSIUM: 3.6 meq/L (ref 3.5–5.1)
SODIUM: 144 meq/L (ref 135–145)
TOTAL PROTEIN: 6.5 g/dL (ref 6.0–8.3)
Total Bilirubin: 0.3 mg/dL (ref 0.2–1.2)

## 2016-05-15 LAB — CBC WITH DIFFERENTIAL/PLATELET
BASOS ABS: 0.1 10*3/uL (ref 0.0–0.1)
Basophils Relative: 0.7 % (ref 0.0–3.0)
EOS PCT: 4.4 % (ref 0.0–5.0)
Eosinophils Absolute: 0.5 10*3/uL (ref 0.0–0.7)
HCT: 32.1 % — ABNORMAL LOW (ref 36.0–46.0)
Hemoglobin: 10.1 g/dL — ABNORMAL LOW (ref 12.0–15.0)
LYMPHS ABS: 3.2 10*3/uL (ref 0.7–4.0)
Lymphocytes Relative: 27.5 % (ref 12.0–46.0)
MCHC: 31.6 g/dL (ref 30.0–36.0)
MCV: 78.6 fl (ref 78.0–100.0)
MONO ABS: 0.5 10*3/uL (ref 0.1–1.0)
Monocytes Relative: 3.8 % (ref 3.0–12.0)
NEUTROS PCT: 63.6 % (ref 43.0–77.0)
Neutro Abs: 7.5 10*3/uL (ref 1.4–7.7)
Platelets: 420 10*3/uL — ABNORMAL HIGH (ref 150.0–400.0)
RBC: 4.08 Mil/uL (ref 3.87–5.11)
RDW: 16.3 % — ABNORMAL HIGH (ref 11.5–15.5)
WBC: 11.8 10*3/uL — ABNORMAL HIGH (ref 4.0–10.5)

## 2016-05-19 ENCOUNTER — Ambulatory Visit (INDEPENDENT_AMBULATORY_CARE_PROVIDER_SITE_OTHER): Payer: 59 | Admitting: Internal Medicine

## 2016-05-19 ENCOUNTER — Encounter: Payer: Self-pay | Admitting: Internal Medicine

## 2016-05-19 VITALS — BP 136/72 | HR 60 | Ht 70.0 in | Wt 230.0 lb

## 2016-05-19 DIAGNOSIS — K5150 Left sided colitis without complications: Secondary | ICD-10-CM | POA: Diagnosis not present

## 2016-05-19 DIAGNOSIS — Z79899 Other long term (current) drug therapy: Secondary | ICD-10-CM

## 2016-05-19 DIAGNOSIS — Z7960 Long term (current) use of unspecified immunomodulators and immunosuppressants: Secondary | ICD-10-CM

## 2016-05-19 DIAGNOSIS — R609 Edema, unspecified: Secondary | ICD-10-CM | POA: Diagnosis not present

## 2016-05-19 DIAGNOSIS — E559 Vitamin D deficiency, unspecified: Secondary | ICD-10-CM | POA: Diagnosis not present

## 2016-05-19 DIAGNOSIS — D500 Iron deficiency anemia secondary to blood loss (chronic): Secondary | ICD-10-CM | POA: Diagnosis not present

## 2016-05-19 MED ORDER — PREDNISONE 10 MG PO TABS: ORAL_TABLET | ORAL | 0 refills | Status: DC

## 2016-05-19 NOTE — Patient Instructions (Addendum)
  Please purchase over the counter ferrous sulfate 366m and take one twice a day.   Dr GCarlean Purlwants you to taper your Prednisone as follows:  Take 136mfor 2 weeks, then 1082mor 2 weeks, then 5mg68mr 2 weeks then stop.   In a month please come and get lab work in our basement lab.  They are open 7:30AM-5:30PM, no appointment needed.    We are providing you with a letter to return to work on Monday December 11th.   Please follow up with us [email or web address] months.   I appreciate the opportunity to care for you. CarlSilvano Rusk, FACGRiverview Ambulatory Surgical Center LLC

## 2016-05-19 NOTE — Progress Notes (Signed)
   Katie Robinson 60 y.o. 10-26-1955 [id number]  Assessment & Plan:   1. Left sided ulcerative colitis without complication ([place])   2. Iron deficiency anemia due to chronic blood loss   3. EdemaOf lower extremities is Resolved   4. Vitamin D deficiency   5. Long-term use of immunosuppressant medication    She is significantly improved at this time. Sounds like nearly asymptomatic. Her albumin is up, her edema is better.  I currently plan for her to continue Humira and low-dose 6-MP. We'll continue the 6-MP for at least 6 months, I explained it can improve response to Humira initially by reducing antibodies and she was so sick I think this stool therapy make sense. She is a low metabolizer of 6-MP so I have her on a low dose.  She will take ferrous sulfate 325 mg twice a day. If she cannot tolerate that we can try parenteral iron.  She will taper prednisone going down 5 mg every 2 weeks over the next 6 weeks to off  Plan to see her back in about 3 months sooner if needed  Recheck CBC, LFTs as part of 6-MP follow-up in one month, and check a vitamin D level then as well. She will finish her high-dose vitamin D soon and continue 1000 units daily  I appreciate the opportunity to care for this patient. CC: Crisoforo Oxford, PA-C    Subjective:   Chief Complaint: Follow-up of left-sided ulcerative colitis  HPI Katie Robinson is here by herself today, he reports she is much better no diarrhea not using Lomotil no mucus or bleeding. She really feels well. The peripheral edema went away but there is some discoloration of her feet and pretibial area. She shows me pictures of striae-like changes. He reports she feel strong enough to go back to work.  Medications, allergies, past medical history, past surgical history, family history and social history are reviewed and updated in the EMR.  Review of Systems  Wt Readings from Last 3 Encounters:  05/19/16 230 lb (104.3 kg)  04/22/16 231 lb  2 oz (104.8 kg)  04/02/16 202 lb (91.6 kg)    Objective:   Physical Exam BP 136/72   Pulse 60   Ht 5' 10"  (1.778 m) Comment: measured without shoes  Wt 230 lb (104.3 kg)   BMI 33.00 kg/m  There is no more lower extremity edema though there are striae like hyperpigmentation changes in the pretibial areas and the top of the feet Eyes are anicteric Mood and affect are appropriate Lab Results  Component Value Date   WBC 11.8 (H) 05/15/2016   HGB 10.1 (L) 05/15/2016   HCT 32.1 (L) 05/15/2016   MCV 78.6 05/15/2016   PLT 420.0 (H) 05/15/2016     Chemistry      Component Value Date/Time   NA 144 05/15/2016 1030   K 3.6 05/15/2016 1030   CL 109 05/15/2016 1030   CO2 24 05/15/2016 1030   BUN 9 05/15/2016 1030   CREATININE 0.86 05/15/2016 1030   CREATININE 0.53 04/29/2015 0001      Component Value Date/Time   CALCIUM 9.9 05/15/2016 1030   ALKPHOS 64 05/15/2016 1030   AST 8 05/15/2016 1030   ALT 9 05/15/2016 1030   BILITOT 0.3 05/15/2016 1030     15 minutes time spent with patient > half in counseling coordination of care

## 2016-05-22 ENCOUNTER — Other Ambulatory Visit: Payer: Self-pay | Admitting: Internal Medicine

## 2016-05-22 NOTE — Telephone Encounter (Signed)
Please advise Sir, thank you. 

## 2016-05-22 NOTE — Telephone Encounter (Signed)
I do not think she is using this

## 2016-05-22 NOTE — Telephone Encounter (Signed)
Spoke with Katie Robinson and she is not taking , only took this a short while.

## 2016-06-24 ENCOUNTER — Telehealth: Payer: Self-pay | Admitting: Internal Medicine

## 2016-06-24 NOTE — Telephone Encounter (Signed)
All questions about 6MP answered.  She will call back for any additional questions or concerns.

## 2016-07-14 ENCOUNTER — Telehealth: Payer: Self-pay | Admitting: Internal Medicine

## 2016-07-14 NOTE — Telephone Encounter (Signed)
Prior auth form started and faxed to [employer]

## 2016-07-22 ENCOUNTER — Other Ambulatory Visit: Payer: Self-pay | Admitting: Internal Medicine

## 2016-07-22 NOTE — Telephone Encounter (Signed)
How many refills Sir, thank you.

## 2016-07-22 NOTE — Telephone Encounter (Signed)
I called and spoke to Katie Robinson, she said she is on 1/2 tablet daily of her 6MP.  Will refill accordingly.

## 2016-07-22 NOTE — Telephone Encounter (Signed)
OK to fix this as we discussed - doadditional and fix going forward - 2 RF

## 2016-08-10 ENCOUNTER — Telehealth: Payer: Self-pay | Admitting: Internal Medicine

## 2016-08-10 NOTE — Telephone Encounter (Signed)
See message. Patient having alopecia with 6MP

## 2016-08-10 NOTE — Telephone Encounter (Signed)
OK  Can DC  Stay on Humira  She should be seeing me in March or April  Hope she is better otherwise - if not let me know

## 2016-08-10 NOTE — Telephone Encounter (Signed)
Patient notified of the recommendations.  6MP removed from her med list.  Follow up scheduled for 10/02/16 1:30

## 2016-10-02 ENCOUNTER — Other Ambulatory Visit (INDEPENDENT_AMBULATORY_CARE_PROVIDER_SITE_OTHER): Payer: 59

## 2016-10-02 ENCOUNTER — Encounter: Payer: Self-pay | Admitting: Internal Medicine

## 2016-10-02 ENCOUNTER — Ambulatory Visit (INDEPENDENT_AMBULATORY_CARE_PROVIDER_SITE_OTHER): Payer: 59 | Admitting: Internal Medicine

## 2016-10-02 DIAGNOSIS — E559 Vitamin D deficiency, unspecified: Secondary | ICD-10-CM

## 2016-10-02 DIAGNOSIS — K5150 Left sided colitis without complications: Secondary | ICD-10-CM

## 2016-10-02 DIAGNOSIS — D500 Iron deficiency anemia secondary to blood loss (chronic): Secondary | ICD-10-CM

## 2016-10-02 DIAGNOSIS — Z7960 Long term (current) use of unspecified immunomodulators and immunosuppressants: Secondary | ICD-10-CM

## 2016-10-02 DIAGNOSIS — R609 Edema, unspecified: Secondary | ICD-10-CM | POA: Diagnosis not present

## 2016-10-02 DIAGNOSIS — Z79899 Other long term (current) drug therapy: Secondary | ICD-10-CM | POA: Diagnosis not present

## 2016-10-02 LAB — CBC WITH DIFFERENTIAL/PLATELET
Basophils Absolute: 0.1 10*3/uL (ref 0.0–0.1)
Basophils Relative: 1.2 % (ref 0.0–3.0)
EOS ABS: 0.5 10*3/uL (ref 0.0–0.7)
Eosinophils Relative: 4.6 % (ref 0.0–5.0)
HCT: 40.8 % (ref 36.0–46.0)
HEMOGLOBIN: 13.5 g/dL (ref 12.0–15.0)
Lymphocytes Relative: 48.3 % — ABNORMAL HIGH (ref 12.0–46.0)
Lymphs Abs: 5.2 10*3/uL — ABNORMAL HIGH (ref 0.7–4.0)
MCHC: 33 g/dL (ref 30.0–36.0)
MCV: 84.9 fl (ref 78.0–100.0)
MONOS PCT: 8.2 % (ref 3.0–12.0)
Monocytes Absolute: 0.9 10*3/uL (ref 0.1–1.0)
NEUTROS ABS: 4.1 10*3/uL (ref 1.4–7.7)
NEUTROS PCT: 37.7 % — AB (ref 43.0–77.0)
Platelets: 314 10*3/uL (ref 150.0–400.0)
RBC: 4.81 Mil/uL (ref 3.87–5.11)
RDW: 13.9 % (ref 11.5–15.5)
WBC: 10.8 10*3/uL — AB (ref 4.0–10.5)

## 2016-10-02 LAB — VITAMIN D 25 HYDROXY (VIT D DEFICIENCY, FRACTURES): VITD: 20.87 ng/mL — AB (ref 30.00–100.00)

## 2016-10-02 NOTE — Progress Notes (Signed)
   Katie Robinson 61 y.o. February 19, 1956 [id number]  Assessment & Plan:   Encounter Diagnoses  Name Primary?  . Left sided ulcerative (chronic) colitis ([place])   . Vitamin D deficiency    Seems to be doing well overall - some slight loose stools  Add VSL # 3 samples reg strength x 1 month and if helps Rx Check CBC and vit D levels today  Subjective:   Chief Complaint: f/u ulcerative colitis  HPI Doing well w/ only occasional loose stools  Medications, allergies, past medical history, past surgical history, family history and social history are reviewed and updated in the EMR.   Review of Systems Cold, slight cough still  Objective:   Physical Exam @BP  122/86   Ht 5' 10"  (1.778 m)   Wt 233 lb 2 oz (105.7 kg)   BMI 33.45 kg/m @  General:  NAD Eyes:   anicteric Lungs:  clear Heart::  S1S2 no rubs, murmurs or gallops Abdomen:  soft and nontender, BS+ Ext:   no edema, cyanosis or clubbing    Data Reviewed:   Prior visits, etc  15 minutes time spent with patient > half in counseling coordination of care

## 2016-10-02 NOTE — Assessment & Plan Note (Signed)
Doing well Stay on Humira RTC 6 mos CBC f/u today

## 2016-10-02 NOTE — Assessment & Plan Note (Signed)
F/U vit D level

## 2016-10-02 NOTE — Patient Instructions (Signed)
Your physician has requested that you go to the basement for the following lab work before leaving today: CBC/diff, vitamin D   I appreciate the opportunity to care for you. Silvano Rusk, MD, [facility]

## 2016-10-05 ENCOUNTER — Encounter: Payer: Self-pay | Admitting: Internal Medicine

## 2016-10-05 ENCOUNTER — Other Ambulatory Visit: Payer: Self-pay

## 2016-10-05 MED ORDER — VITAMIN D (ERGOCALCIFEROL) 1.25 MG (50000 UNIT) PO CAPS: 50000.0000 [IU] | ORAL_CAPSULE | ORAL | 0 refills | Status: DC

## 2016-10-05 MED ORDER — VSL#3 PO CAPS: 1.0000 | ORAL_CAPSULE | Freq: Every day | ORAL | 6 refills | Status: DC

## 2016-10-05 NOTE — Progress Notes (Signed)
hemglobin now normal  Vitamin D remains low - probably not a critical issue but we think having a higher level is in her best interest  I suggest 50K U weekly x 12 weeks please # 12 no refills She has done some Tx already - I am aware but need to repeat

## 2016-10-07 NOTE — Progress Notes (Signed)
Patient notified to come pick up VSL #3 samples

## 2016-10-09 ENCOUNTER — Other Ambulatory Visit: Payer: Self-pay | Admitting: Internal Medicine

## 2016-10-14 ENCOUNTER — Encounter: Payer: Self-pay | Admitting: Internal Medicine

## 2016-11-02 ENCOUNTER — Telehealth: Payer: Self-pay | Admitting: Internal Medicine

## 2016-11-02 MED ORDER — VSL#3 PO CAPS: 1.0000 | ORAL_CAPSULE | Freq: Every day | ORAL | 1 refills | Status: DC

## 2016-11-02 MED ORDER — VSL#3 PO CAPS: 1.0000 | ORAL_CAPSULE | Freq: Every day | ORAL | 3 refills | Status: DC

## 2016-11-02 MED ORDER — VITAMIN D (CHOLECALCIFEROL) 25 MCG (1000 UT) PO CAPS: 1.0000 | ORAL_CAPSULE | Freq: Every day | ORAL | 1 refills | Status: DC

## 2016-11-02 NOTE — Telephone Encounter (Signed)
Called and spoke with Katharine Look to clarify which rx she needed sent in.  Sent in a month's worth of VSL #3  To [facility] and 90 day supply to [facility].  Checked with [facility] and she has enough Vitamin D 50,000 IU getting #4 at a time to take for 12 weeks.

## 2016-11-02 NOTE — Telephone Encounter (Signed)
OK 

## 2016-11-02 NOTE — Telephone Encounter (Signed)
Please advise if okay Sir?  Thank you.

## 2016-11-02 NOTE — Telephone Encounter (Signed)
Patient states that she needs medication vitamin D (drisdol) [facility] and the probiotic Dr.Gessner prescribed sent to [facility] on [address]

## 2016-12-31 ENCOUNTER — Other Ambulatory Visit: Payer: Self-pay | Admitting: Internal Medicine

## 2016-12-31 DIAGNOSIS — E559 Vitamin D deficiency, unspecified: Secondary | ICD-10-CM

## 2016-12-31 NOTE — Telephone Encounter (Signed)
     Left Katie Robinson a detailed message to come get lab work drawn to check her vitamin D level per Dr Carlean Purl.

## 2016-12-31 NOTE — Telephone Encounter (Signed)
No refill Please ask her to have it rechecked   Order 25 OH vit D level and dx is vit D deficiency

## 2016-12-31 NOTE — Telephone Encounter (Signed)
Do you wish this to be refilled Sir, last labs in April.

## 2017-01-04 NOTE — Telephone Encounter (Signed)
Katie Robinson by phone and she will come have her Vitamin D level drawn soon.

## 2017-01-12 ENCOUNTER — Other Ambulatory Visit (INDEPENDENT_AMBULATORY_CARE_PROVIDER_SITE_OTHER): Payer: 59

## 2017-01-12 DIAGNOSIS — E559 Vitamin D deficiency, unspecified: Secondary | ICD-10-CM | POA: Diagnosis not present

## 2017-01-12 LAB — VITAMIN D 25 HYDROXY (VIT D DEFICIENCY, FRACTURES): VITD: 37.69 ng/mL (ref 30.00–100.00)

## 2017-01-14 ENCOUNTER — Other Ambulatory Visit: Payer: Self-pay | Admitting: Internal Medicine

## 2017-01-14 NOTE — Progress Notes (Signed)
Let her know its NL Stay on 100 IU qd

## 2017-02-22 ENCOUNTER — Other Ambulatory Visit: Payer: Self-pay | Admitting: Medical

## 2017-05-05 ENCOUNTER — Other Ambulatory Visit: Payer: Self-pay | Admitting: Internal Medicine

## 2017-05-10 ENCOUNTER — Encounter: Payer: Self-pay | Admitting: Medical

## 2017-05-10 ENCOUNTER — Ambulatory Visit (INDEPENDENT_AMBULATORY_CARE_PROVIDER_SITE_OTHER): Payer: 59 | Admitting: Medical

## 2017-05-10 VITALS — BP 130/78 | HR 90 | Ht 70.0 in | Wt 241.2 lb

## 2017-05-10 DIAGNOSIS — Z1231 Encounter for screening mammogram for malignant neoplasm of breast: Secondary | ICD-10-CM | POA: Diagnosis not present

## 2017-05-10 DIAGNOSIS — Z23 Encounter for immunization: Secondary | ICD-10-CM

## 2017-05-10 DIAGNOSIS — J301 Allergic rhinitis due to pollen: Secondary | ICD-10-CM | POA: Insufficient documentation

## 2017-05-10 DIAGNOSIS — J4530 Mild persistent asthma, uncomplicated: Secondary | ICD-10-CM

## 2017-05-10 DIAGNOSIS — Z1239 Encounter for other screening for malignant neoplasm of breast: Secondary | ICD-10-CM

## 2017-05-10 DIAGNOSIS — Z86010 Personal history of colonic polyps: Secondary | ICD-10-CM

## 2017-05-10 DIAGNOSIS — E559 Vitamin D deficiency, unspecified: Secondary | ICD-10-CM | POA: Diagnosis not present

## 2017-05-10 DIAGNOSIS — R202 Paresthesia of skin: Secondary | ICD-10-CM

## 2017-05-10 DIAGNOSIS — Z1159 Encounter for screening for other viral diseases: Secondary | ICD-10-CM | POA: Insufficient documentation

## 2017-05-10 DIAGNOSIS — K51919 Ulcerative colitis, unspecified with unspecified complications: Secondary | ICD-10-CM

## 2017-05-10 DIAGNOSIS — Z0000 Encounter for general adult medical examination without abnormal findings: Secondary | ICD-10-CM | POA: Diagnosis not present

## 2017-05-10 LAB — POCT URINALYSIS DIP (PROADVANTAGE DEVICE)
BILIRUBIN UA: NEGATIVE
BILIRUBIN UA: NEGATIVE mg/dL
Blood, UA: NEGATIVE
GLUCOSE UA: NEGATIVE mg/dL
NITRITE UA: NEGATIVE
Protein Ur, POC: NEGATIVE mg/dL
Specific Gravity, Urine: 1.02
Urobilinogen, Ur: NEGATIVE
pH, UA: 6 (ref 5.0–8.0)

## 2017-05-10 MED ORDER — ALBUTEROL SULFATE HFA 108 (90 BASE) MCG/ACT IN AERS: 2.0000 | INHALATION_SPRAY | Freq: Four times a day (QID) | RESPIRATORY_TRACT | 1 refills | Status: DC | PRN

## 2017-05-10 NOTE — Patient Instructions (Signed)
Encounter Diagnoses  Name Primary?  . Routine general medical examination at a health care facility Yes  . Need for influenza vaccination   . Vitamin D deficiency   . Hx of adenomatous and sessile serrated colonic polyps   . Ulcerative colitis with complication, unspecified location ([place])   . Screening for breast cancer   . Mild persistent asthma without complication   . Allergic rhinitis due to pollen, unspecified seasonality    Recommendations  Eat a healthy low fat diet  Exercise regularly  Try to work on losing some weight such as setting a goal to try and lose 15-20 lb in the next 4 months.   This can help reduce risk of high blood pressure, diabetes, and joint pains among other issues  Begin a reinforced wrist splint over the counter for the right hand.   Use this at bedtime.   If not much improvement in the next 3- 4 weeks, you may ultimately need to see orthopedist.    Scheduled a mammogram  Continue the albuterol inhaler as needed  Your lung study looked good today  We received a physical form from [place].  I am not sure why we received this.  You may want to check with your human resources department or your insurer about this.   For now we will not complete this.

## 2017-05-10 NOTE — Progress Notes (Signed)
Subjective:   HPI  Katie Robinson is a 61 y.o. female who presents for physical Chief Complaint  Patient presents with  . Annual Exam    physical   Medical care team includes: Amiah Frohlich, Leward Quan here for primary care Dentist Eye doctor Dr. Silvano Rusk, GI   Concerns: Here for flu shot  2017-2018 has been pretty rough in terms of the ulcerative coliitis. Has had several hospitalizations.  started Humira 03/2016.  Last few months have been stable.  Only c/o today is right hand numbness in thumb and 2nd and 3rd fingers.  No arm numbness or weakness in rest of arm.   No weakness in hand.   Is right handed, works at [employer].   Reviewed their medical, surgical, family, social, medication, and allergy history and updated chart as appropriate.  Past Medical History:  Diagnosis Date  . Allergy    SEASONAL  . Anemia   . Arthralgia   . Asthma   . Chronic headache    many years  . Family history of premature coronary artery disease    father  . Former smoker    10 pack year history  . GERD (gastroesophageal reflux disease)    mild, occasional  . H/O bone density study 11/2015   normal  . H/O cardiovascular stress test 2009  . Hx of adenomatous and sessile serrated colonic polyps 05/24/2014  . Iron deficiency 11/29/2015  . Obesity   . Pectus excavatum    mild, left asymmetric  . S/P hysterectomy    due to fibroids  . Seasonal allergic rhinitis   . UC (ulcerative colitis) ([place]) 2015   Dr. Carlean Purl  . Urinary tract infection 2013  . Vitamin D deficiency 07/18/2014  . Wears glasses     Past Surgical History:  Procedure Laterality Date  . COLONOSCOPY  02/2016   polyps; Dr. Silvano Rusk  . FOOT TENDON SURGERY Left 2013  . NAVEL REMOVED     AS A CHILD  . OTHER SURGICAL HISTORY     biopsy for possible sarcoidosis  . POLYPECTOMY    . SKIN BIOPSY    . TONSILLECTOMY    . TOTAL ABDOMINAL HYSTERECTOMY  2002   total; Dr. Molli Posey  . WISDOM TOOTH EXTRACTION       Social History   Socioeconomic History  . Marital status: Divorced    Spouse name: Not on file  . Number of children: 2  . Years of education: Not on file  . Highest education level: Not on file  Social Needs  . Financial resource strain: Not on file  . Food insecurity - worry: Not on file  . Food insecurity - inability: Not on file  . Transportation needs - medical: Not on file  . Transportation needs - non-medical: Not on file  Occupational History  . Occupation: Scientist, research (medical)  Tobacco Use  . Smoking status: Former Smoker    Types: Cigarettes    Last attempt to quit: 06/15/1988    Years since quitting: 28.9  . Smokeless tobacco: Never Used  Substance and Sexual Activity  . Alcohol use: Yes    Alcohol/week: 0.0 oz    Comment: rarely  . Drug use: No  . Sexual activity: Not on file  Other Topics Concern  . Not on file  Social History Narrative   Lives with her son, has 2 adult children, not married, no exercise, active with walking on the job at the [place] [employer].  04/2017    Family History  Problem Relation Age of Onset  . Asthma Father   . Heart disease Father 73       died of MI mid 08/15/2022  . Hypertension Sister   . Asthma Sister   . Hypertension Mother   . Aneurysm Mother        brain  . Alzheimer's disease Mother   . Heart disease Mother   . Cancer Neg Hx   . Stroke Neg Hx   . Diabetes Neg Hx   . Colon cancer Neg Hx   . Colon polyps Neg Hx   . Crohn's disease Neg Hx   . Ulcerative colitis Neg Hx      Current Outpatient Medications:  .  albuterol (PROVENTIL HFA;VENTOLIN HFA) 108 (90 Base) MCG/ACT inhaler, Inhale 2 puffs into the lungs every 6 (six) hours as needed for wheezing or shortness of breath., Disp: 18 g, Rfl: 1 .  HUMIRA 40 MG/0.8ML PSKT, INJECT 0.8 ML (40 MG) UNDER THE SKIN EVERY 14 DAYS, Disp: 2 each, Rfl: 6 .  OVER THE COUNTER MEDICATION, 1,000 mg. Vitamin d, Disp: , Rfl:  .  albuterol (PROVENTIL) (2.5 MG/3ML) 0.083% nebulizer  solution, Take 3 mLs (2.5 mg total) by nebulization every 6 (six) hours as needed for wheezing or shortness of breath. (Patient not taking: Reported on 05/10/2017), Disp: 75 mL, Rfl: 1 .  Probiotic Product (VSL#3) CAPS, Take 1 capsule by mouth daily. (Patient not taking: Reported on 05/10/2017), Disp: 90 capsule, Rfl: 3  No Known Allergies     Review of Systems Constitutional: -fever, -chills, -sweats, -unexpected weight change, -decreased appetite, -fatigue Allergy: -sneezing, -itching, -congestion Dermatology: -changing moles, --rash, -lumps ENT: -runny nose, -ear pain, -sore throat, -hoarseness, -sinus pain, -teeth pain, - ringing in ears, -hearing loss, -nosebleeds Cardiology: -chest pain, -palpitations, -swelling, -difficulty breathing when lying flat, -waking up short of breath Respiratory: -cough, -shortness of breath, -difficulty breathing with exercise or exertion, -wheezing, -coughing up blood Gastroenterology: +abdominal pain, -nausea, -vomiting Hematology: -bleeding, -bruising  Musculoskeletal: -joint aches, -muscle aches, -joint swelling, -back pain, -neck pain, -cramping, -changes in gait Ophthalmology: denies vision changes, eye redness, itching, discharge Urology: -burning with urination, -difficulty urinating, -blood in urine, -urinary frequency, -urgency, -incontinence Neurology: -headache, -weakness, -tingling, +numbness, -memory loss, -falls, -dizziness Psychology: -depressed mood, -agitation, -sleep problems Breast/gyn: -breast tenderness, -discharge, -lumps, -vaginal discharge,- irregular periods, -heavy periods      Objective:   BP 130/78   Pulse 90   Ht 5' 10"  (1.778 m)   Wt 241 lb 3.2 oz (109.4 kg)   SpO2 97%   BMI 34.61 kg/m   Wt Readings from Last 3 Encounters:  05/10/17 241 lb 3.2 oz (109.4 kg)  10/02/16 233 lb 2 oz (105.7 kg)  05/19/16 230 lb (104.3 kg)    General appearance: alert, no distress, WD/WN, African American female Skin: left lateral  neck/supraclavicular region with 3cm linear surgical scar from prior biopsy, no worrisome lesions HEENT: normocephalic, conjunctiva/corneas normal, sclerae anicteric, PERRLA, EOMi, nares patent, no discharge or erythema, pharynx normal Oral cavity: MMM, tongue normal, teeth normal Neck: supple, no lymphadenopathy, no thyromegaly, no masses, normal ROM, no bruits Chest: non tender, normal shape and expansion Heart: RRR, normal S1, S2, no murmurs Lungs: CTA bilaterally, no wheezes, rhonchi, or rales Abdomen: +bs, soft, umbilical region without obvious umbilicus, surgical scar, non tender, non distended, no masses, no hepatomegaly, no splenomegaly, no bruits Back: non tender, normal ROM, no scoliosis Musculoskeletal: left lower leg anterior  linear vertical surgical scar,  upper extremities non tender, no obvious deformity, normal ROM throughout, lower extremities non tender, no obvious deformity, normal ROM throughout Extremities: no edema, no cyanosis, no clubbing Pulses: 2+ symmetric, upper and lower extremities, normal cap refill Neurological: alert, oriented x 3, CN2-12 intact, strength normal upper extremities and lower extremities, sensation normal throughout, DTRs 2+ throughout, no cerebellar signs, gait normal Psychiatric: normal affect, behavior normal, pleasant  Breast: nontender, no masses or lumps, no skin changes, no nipple discharge or inversion, no axillary lymphadenopathy Gyn: Normal external genitalia without lesions, vagina with normal mucosa,adnexa not enlarged, nontender, no masses.  Anus normal appearing.  Exam chaperoned by nurse.    Assessment and Plan :    Encounter Diagnoses  Name Primary?  . Routine general medical examination at a health care facility Yes  . Need for influenza vaccination   . Vitamin D deficiency   . Hx of adenomatous and sessile serrated colonic polyps   . Ulcerative colitis with complication, unspecified location ([place])   . Screening for breast  cancer   . Mild persistent asthma without complication   . Allergic rhinitis due to pollen, unspecified seasonality   . Right hand paresthesia     Physical exam - discussed and counseled on healthy lifestyle, diet, exercise, preventative care, vaccinations, sick and well care, proper use of emergency dept and after hours care, and addressed their concerns.    Health screening: Reviewed 2017 colonoscopy and bone density scans See your eye doctor yearly for routine vision care. See your dentist yearly for routine dental care including hygiene visits twice yearly.  Cancer screening Discussed and advised monthly self breast exams Discussed mammogram, advised mammogram now Discussed pelvic exams, and we did pelvic exam today  Vaccinations: Counseled on the influenza virus vaccine.  Vaccine information sheet given.  Influenza vaccine given after consent obtained.  Separate significant chronic issues discussed: Ulcerative colitis -managed by GI with routine f/u.  Asthma - normal PFT.  C/t albuterol prn.  Avoid triggers.  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  Begin wrist splint reinforced QHS for next 3-4 weeks.  F/u 64mo  SKordeliawas seen today for annual exam.  Diagnoses and all orders for this visit:  Routine general medical examination at a health care facility -     POCT Urinalysis DIP (Proadvantage Device) -     Comprehensive metabolic panel -     CBC with Differential/Platelet -     Lipid panel -     Hemoglobin A1c -     TSH  Need for influenza vaccination -     Flu Vaccine QUAD 6+ mos PF IM (Fluarix Quad PF)  Vitamin D deficiency  Hx of adenomatous and sessile serrated colonic polyps  Ulcerative colitis with complication, unspecified location ([place]  Screening for breast cancer -     MM DIGITAL SCREENING BILATERAL  Mild persistent asthma without complication -     Spirometry with Graph  Allergic rhinitis due to pollen, unspecified seasonality  Right hand  paresthesia  Other orders -     albuterol (PROVENTIL HFA;VENTOLIN HFA) 108 (90 Base) MCG/ACT inhaler; Inhale 2 puffs into the lungs every 6 (six) hours as needed for wheezing or shortness of breath.   Follow-up pending labs, yearly for physical

## 2017-05-11 ENCOUNTER — Encounter: Payer: Self-pay | Admitting: Internal Medicine

## 2017-05-11 LAB — LIPID PANEL
CHOLESTEROL: 238 mg/dL — AB (ref <200)
HDL: 82 mg/dL (ref >50)
LDL Cholesterol (Calc): 119 mg/dL (calc) — ABNORMAL HIGH
Non-HDL Cholesterol (Calc): 156 mg/dL (calc) — ABNORMAL HIGH (ref <130)
Total CHOL/HDL Ratio: 2.9 (calc) (ref <5.0)
Triglycerides: 247 mg/dL — ABNORMAL HIGH (ref <150)

## 2017-05-11 LAB — CBC WITH DIFFERENTIAL/PLATELET
BASOS ABS: 63 {cells}/uL (ref 0–200)
BASOS PCT: 0.7 %
EOS ABS: 414 {cells}/uL (ref 15–500)
Eosinophils Relative: 4.6 %
HEMATOCRIT: 42.9 % (ref 35.0–45.0)
HEMOGLOBIN: 14.4 g/dL (ref 11.7–15.5)
LYMPHS ABS: 5166 {cells}/uL — AB (ref 850–3900)
MCH: 28.1 pg (ref 27.0–33.0)
MCHC: 33.6 g/dL (ref 32.0–36.0)
MCV: 83.6 fL (ref 80.0–100.0)
MONOS PCT: 6.7 %
MPV: 9.8 fL (ref 7.5–12.5)
NEUTROS ABS: 2754 {cells}/uL (ref 1500–7800)
Neutrophils Relative %: 30.6 %
Platelets: 304 10*3/uL (ref 140–400)
RBC: 5.13 10*6/uL — ABNORMAL HIGH (ref 3.80–5.10)
RDW: 12.4 % (ref 11.0–15.0)
Total Lymphocyte: 57.4 %
WBC mixed population: 603 cells/uL (ref 200–950)
WBC: 9 10*3/uL (ref 3.8–10.8)

## 2017-05-11 LAB — COMPREHENSIVE METABOLIC PANEL
AG RATIO: 1.4 (calc) (ref 1.0–2.5)
ALBUMIN MSPROF: 4.3 g/dL (ref 3.6–5.1)
ALKALINE PHOSPHATASE (APISO): 162 U/L — AB (ref 33–130)
ALT: 12 U/L (ref 6–29)
AST: 14 U/L (ref 10–35)
BUN: 12 mg/dL (ref 7–25)
CALCIUM: 10.7 mg/dL — AB (ref 8.6–10.4)
CHLORIDE: 105 mmol/L (ref 98–110)
CO2: 28 mmol/L (ref 20–32)
Creat: 0.59 mg/dL (ref 0.50–0.99)
GLOBULIN: 3.1 g/dL (ref 1.9–3.7)
Glucose, Bld: 81 mg/dL (ref 65–99)
POTASSIUM: 4.4 mmol/L (ref 3.5–5.3)
SODIUM: 139 mmol/L (ref 135–146)
TOTAL PROTEIN: 7.4 g/dL (ref 6.1–8.1)
Total Bilirubin: 0.3 mg/dL (ref 0.2–1.2)

## 2017-05-11 LAB — TSH: TSH: 0.66 m[IU]/L (ref 0.40–4.50)

## 2017-05-11 LAB — HEMOGLOBIN A1C
EAG (MMOL/L): 6.2 (calc)
HEMOGLOBIN A1C: 5.5 %{Hb} (ref <5.7)
MEAN PLASMA GLUCOSE: 111 (calc)

## 2017-05-14 ENCOUNTER — Telehealth: Payer: Self-pay

## 2017-05-14 DIAGNOSIS — K51919 Ulcerative colitis, unspecified with unspecified complications: Secondary | ICD-10-CM

## 2017-05-14 NOTE — Telephone Encounter (Signed)
Patient notified of the recommendations She will come for labs at her convenience She will come for follow up on 07/12/17

## 2017-05-15 DIAGNOSIS — C50912 Malignant neoplasm of unspecified site of left female breast: Secondary | ICD-10-CM

## 2017-05-15 HISTORY — DX: Malignant neoplasm of unspecified site of left female breast: C50.912

## 2017-05-20 ENCOUNTER — Other Ambulatory Visit: Payer: Self-pay

## 2017-05-20 DIAGNOSIS — R5381 Other malaise: Secondary | ICD-10-CM

## 2017-05-31 ENCOUNTER — Other Ambulatory Visit: Payer: Self-pay | Admitting: Orthopedic Surgery

## 2017-05-31 DIAGNOSIS — M533 Sacrococcygeal disorders, not elsewhere classified: Secondary | ICD-10-CM

## 2017-06-02 ENCOUNTER — Other Ambulatory Visit: Payer: Self-pay | Admitting: Orthopedic Surgery

## 2017-06-02 DIAGNOSIS — M545 Low back pain, unspecified: Secondary | ICD-10-CM

## 2017-06-02 DIAGNOSIS — M5416 Radiculopathy, lumbar region: Secondary | ICD-10-CM

## 2017-06-07 ENCOUNTER — Ambulatory Visit
Admission: RE | Admit: 2017-06-07 | Discharge: 2017-06-07 | Disposition: A | Discharge status: Home / Self Care | Payer: 59 | Source: Ambulatory Visit | Attending: Medical | Admitting: Medical

## 2017-06-09 ENCOUNTER — Other Ambulatory Visit: Payer: Self-pay | Admitting: Medical

## 2017-06-09 DIAGNOSIS — R928 Other abnormal and inconclusive findings on diagnostic imaging of breast: Secondary | ICD-10-CM

## 2017-06-10 ENCOUNTER — Other Ambulatory Visit: Payer: Self-pay | Admitting: Medical

## 2017-06-10 ENCOUNTER — Ambulatory Visit
Admission: RE | Admit: 2017-06-10 | Discharge: 2017-06-10 | Disposition: A | Discharge status: Home / Self Care | Payer: 59 | Source: Ambulatory Visit | Attending: Medical | Admitting: Medical

## 2017-06-10 DIAGNOSIS — R928 Other abnormal and inconclusive findings on diagnostic imaging of breast: Secondary | ICD-10-CM

## 2017-06-10 DIAGNOSIS — R921 Mammographic calcification found on diagnostic imaging of breast: Secondary | ICD-10-CM

## 2017-06-12 ENCOUNTER — Ambulatory Visit
Admission: RE | Admit: 2017-06-12 | Discharge: 2017-06-12 | Disposition: A | Discharge status: Home / Self Care | Payer: 59 | Source: Ambulatory Visit | Attending: Orthopedic Surgery | Admitting: Orthopedic Surgery

## 2017-06-12 DIAGNOSIS — M545 Low back pain, unspecified: Secondary | ICD-10-CM

## 2017-06-12 DIAGNOSIS — M5416 Radiculopathy, lumbar region: Secondary | ICD-10-CM

## 2017-06-14 ENCOUNTER — Ambulatory Visit
Admission: RE | Admit: 2017-06-14 | Discharge: 2017-06-14 | Disposition: A | Discharge status: Home / Self Care | Payer: 59 | Source: Ambulatory Visit | Attending: Medical | Admitting: Medical

## 2017-06-14 DIAGNOSIS — R921 Mammographic calcification found on diagnostic imaging of breast: Secondary | ICD-10-CM

## 2017-06-15 HISTORY — PX: BREAST LUMPECTOMY: SHX2

## 2017-06-16 ENCOUNTER — Other Ambulatory Visit: Payer: Self-pay | Admitting: Medical

## 2017-06-16 DIAGNOSIS — N63 Unspecified lump in unspecified breast: Secondary | ICD-10-CM

## 2017-06-17 ENCOUNTER — Ambulatory Visit
Admission: RE | Admit: 2017-06-17 | Discharge: 2017-06-17 | Disposition: A | Discharge status: Home / Self Care | Payer: 59 | Source: Ambulatory Visit | Attending: Medical | Admitting: Medical

## 2017-06-17 ENCOUNTER — Telehealth: Payer: Self-pay | Admitting: Hematology and Oncology

## 2017-06-17 ENCOUNTER — Other Ambulatory Visit: Payer: Self-pay | Admitting: Medical

## 2017-06-17 DIAGNOSIS — C50312 Malignant neoplasm of lower-inner quadrant of left female breast: Secondary | ICD-10-CM

## 2017-06-17 DIAGNOSIS — N63 Unspecified lump in unspecified breast: Secondary | ICD-10-CM

## 2017-06-17 NOTE — Telephone Encounter (Signed)
Spoke with patient to confirm 06/23/17 [facility] appointment with patient, packet will be mailed to patient

## 2017-06-18 ENCOUNTER — Encounter: Payer: Self-pay | Admitting: Hematology and Oncology

## 2017-06-18 ENCOUNTER — Encounter: Payer: Self-pay | Admitting: *Deleted

## 2017-06-18 ENCOUNTER — Ambulatory Visit
Admission: RE | Admit: 2017-06-18 | Discharge: 2017-06-18 | Disposition: A | Discharge status: Home / Self Care | Payer: 59 | Source: Ambulatory Visit | Attending: Orthopedic Surgery | Admitting: Orthopedic Surgery

## 2017-06-18 DIAGNOSIS — M533 Sacrococcygeal disorders, not elsewhere classified: Secondary | ICD-10-CM

## 2017-06-18 DIAGNOSIS — D0512 Intraductal carcinoma in situ of left breast: Secondary | ICD-10-CM | POA: Insufficient documentation

## 2017-06-21 ENCOUNTER — Encounter: Payer: Self-pay | Admitting: Medical

## 2017-06-22 ENCOUNTER — Encounter: Payer: Self-pay | Admitting: Medical

## 2017-06-23 ENCOUNTER — Encounter: Payer: Self-pay | Admitting: Oncology

## 2017-06-23 ENCOUNTER — Other Ambulatory Visit: Payer: Self-pay | Admitting: General Surgery

## 2017-06-23 ENCOUNTER — Ambulatory Visit: Payer: 59 | Attending: General Surgery | Admitting: Physical Therapy

## 2017-06-23 ENCOUNTER — Encounter: Payer: Self-pay | Admitting: *Deleted

## 2017-06-23 ENCOUNTER — Inpatient Hospital Stay: Payer: 59 | Attending: Oncology | Admitting: Oncology

## 2017-06-23 ENCOUNTER — Encounter: Payer: Self-pay | Admitting: Radiation Oncology

## 2017-06-23 ENCOUNTER — Telehealth: Payer: Self-pay | Admitting: *Deleted

## 2017-06-23 ENCOUNTER — Ambulatory Visit
Admission: RE | Admit: 2017-06-23 | Discharge: 2017-06-23 | Disposition: A | Discharge status: Home / Self Care | Payer: 59 | Source: Ambulatory Visit | Attending: Radiation Oncology | Admitting: Radiation Oncology

## 2017-06-23 ENCOUNTER — Inpatient Hospital Stay: Payer: 59

## 2017-06-23 ENCOUNTER — Encounter: Payer: Self-pay | Admitting: Physical Therapy

## 2017-06-23 VITALS — BP 157/100 | HR 75 | Temp 98.9°F | Resp 20 | Ht 70.0 in | Wt 238.0 lb

## 2017-06-23 DIAGNOSIS — D0512 Intraductal carcinoma in situ of left breast: Secondary | ICD-10-CM

## 2017-06-23 DIAGNOSIS — Z170 Estrogen receptor positive status [ER+]: Secondary | ICD-10-CM

## 2017-06-23 DIAGNOSIS — Z87891 Personal history of nicotine dependence: Secondary | ICD-10-CM | POA: Diagnosis not present

## 2017-06-23 DIAGNOSIS — C50312 Malignant neoplasm of lower-inner quadrant of left female breast: Secondary | ICD-10-CM | POA: Insufficient documentation

## 2017-06-23 DIAGNOSIS — R293 Abnormal posture: Secondary | ICD-10-CM | POA: Diagnosis present

## 2017-06-23 DIAGNOSIS — R040 Epistaxis: Secondary | ICD-10-CM

## 2017-06-23 LAB — COMPREHENSIVE METABOLIC PANEL
ALBUMIN: 3.9 g/dL (ref 3.5–5.0)
ALK PHOS: 159 U/L — AB (ref 40–150)
ALT: 18 U/L (ref 0–55)
AST: 18 U/L (ref 5–34)
Anion gap: 7 (ref 3–11)
BUN: 16 mg/dL (ref 7–26)
CALCIUM: 10.4 mg/dL (ref 8.4–10.4)
CO2: 24 mmol/L (ref 22–29)
Chloride: 109 mmol/L (ref 98–109)
Creatinine, Ser: 0.79 mg/dL (ref 0.60–1.10)
GFR calc Af Amer: >60 mL/min (ref >60)
GFR calc non Af Amer: >60 mL/min (ref >60)
GLUCOSE: 97 mg/dL (ref 70–140)
Potassium: 4.1 mmol/L (ref 3.3–4.7)
SODIUM: 140 mmol/L (ref 136–145)
Total Bilirubin: 0.4 mg/dL (ref 0.2–1.2)
Total Protein: 7.3 g/dL (ref 6.4–8.3)

## 2017-06-23 LAB — CBC WITH DIFFERENTIAL/PLATELET
Abs Granulocyte: 2.5 10*3/uL (ref 1.5–6.5)
Basophils Absolute: 0 10*3/uL (ref 0.0–0.1)
Basophils Relative: 1 %
Eosinophils Absolute: 0.4 10*3/uL (ref 0.0–0.5)
Eosinophils Relative: 5 %
HEMATOCRIT: 43.7 % (ref 34.8–46.6)
HEMOGLOBIN: 14.3 g/dL (ref 11.6–15.9)
LYMPHS ABS: 4.5 10*3/uL — AB (ref 0.9–3.3)
Lymphocytes Relative: 56 %
MCH: 28.6 pg (ref 25.1–34.0)
MCHC: 32.7 g/dL (ref 31.5–36.0)
MCV: 87.4 fL (ref 79.5–101.0)
MONOS PCT: 7 %
Monocytes Absolute: 0.5 10*3/uL (ref 0.1–0.9)
NEUTROS ABS: 2.5 10*3/uL (ref 1.5–6.5)
Neutrophils Relative %: 31 %
Platelets: 262 10*3/uL (ref 145–400)
RBC: 5 MIL/uL (ref 3.70–5.45)
RDW: 13.2 % (ref 11.2–16.1)
WBC: 7.9 10*3/uL (ref 3.9–10.3)

## 2017-06-23 NOTE — Progress Notes (Signed)
Clinical Social Work [place] Psychosocial Distress Screening [place]  Patient completed distress screening protocol and scored a 1 on the Psychosocial Distress Thermometer which indicates mild distress. Clinical Social Worker met with patient and patients family in [facility] to assess for distress and other psychosocial needs. Patient stated she was feeling overwhelmed but felt "better" after meeting with the treatment team and getting more information on her treatment plan. CSW and patient discussed common feeling and emotions when being diagnosed with cancer, and the importance of support during treatment. CSW informed patient of the support team and support services at [facility], and patient was agreeable to an [employer] referral. CSW provided contact information and encouraged patient to call with any questions or concerns.  ONCBCN DISTRESS SCREENING 06/23/2017  Screening Type Initial Screening  Distress experienced in past week (1-10) 1  Spiritual/Religous concerns type Relating to God;Facing my mortality  Referral to support programs Yes     Johnnye Lana, MSW, LCSW, OSW-C Clinical Social Worker [facility] [phone]

## 2017-06-23 NOTE — Progress Notes (Signed)
Radiation Oncology         [phone] ________________________________  Initial Outpatient Consultation  Name: Katie Robinson MRN: [id number]  Date: 06/23/2017  DOB: 14-Jul-1955  CC:Tysinger, Camelia Eng, PA-C  Rolm Bookbinder, MD   REFERRING PHYSICIAN: Rolm Bookbinder, MD  DIAGNOSIS:    ICD-10-CM   1. 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 female who presented with screening detected left breast calcifications spanning 7 x 7 x 7 mm.  Biopsy on 06/14/2017 showed DCIS with calcifications and characteristics as described above in the diagnosis with a focus highly suspicious for stromal invasion. Ultrasound of the left axilla on 06/17/2017 was negative for left axillary lymphadenopathy.  The patient presents to our [facility] today for evaluation and discussion of potential treatment options. She is accompanied by her sister and daughter. On review of systems, she is positive for weight gain, glasses, chronic headaches, history of steroid use for ulcerative colitis, and right sided nose bleeds. She reports her nose bleeds have lasted  for several months, and the blood is bright red.  Of note, the patient was on hormone replacement following her total hysterectomy in 2002. She reports she has not taken any hormones for over 10 years now.  PREVIOUS RADIATION THERAPY: No  PAST MEDICAL HISTORY:  has a past medical history of Allergy, Anemia, Arthralgia, Asthma, Chronic headache, Family history of premature coronary artery disease, Former smoker, GERD (gastroesophageal reflux disease), H/O bone density study (11/2015), H/O cardiovascular stress test (2009), Hx of adenomatous and sessile serrated colonic polyps (05/24/2014), Iron deficiency (11/29/2015), Obesity, Pectus excavatum, S/P hysterectomy, Seasonal allergic rhinitis, UC (ulcerative colitis) ([place]) (2015), Urinary tract infection (2013), Vitamin D deficiency (07/18/2014), and Wears glasses.    PAST SURGICAL HISTORY: Past Surgical History:  Procedure Laterality Date  . COLONOSCOPY  02/2016   polyps; Dr. Silvano Rusk  . FOOT TENDON SURGERY Left 2013  . NAVEL REMOVED     AS A CHILD  . OTHER SURGICAL HISTORY     biopsy for possible sarcoidosis  . POLYPECTOMY    . SKIN BIOPSY    . TONSILLECTOMY    . TOTAL ABDOMINAL HYSTERECTOMY  2002   total; Dr. Molli Posey  . WISDOM TOOTH EXTRACTION      FAMILY HISTORY: family history includes Alzheimer's disease in her mother; Aneurysm in her mother; Asthma in her father and sister; Heart disease in her mother; Heart disease (age of onset: 6) in her father; Hypertension in her mother and sister.  SOCIAL HISTORY:  reports that she quit smoking about 29 years ago. Her smoking use included cigarettes. she has never used smokeless tobacco. She reports that she drinks alcohol. She reports  that she does not use drugs.  ALLERGIES: Patient has no known allergies.  MEDICATIONS:  Current Outpatient Medications  Medication Sig Dispense Refill  . albuterol (PROVENTIL HFA;VENTOLIN HFA) 108 (90 Base) MCG/ACT inhaler Inhale 2 puffs into the  lungs every 6 (six) hours as needed for wheezing or shortness of breath. 18 g 1   No current facility-administered medications for this encounter.     REVIEW OF SYSTEMS: A 10+ POINT REVIEW OF SYSTEMS WAS OBTAINED including neurology, dermatology, psychiatry, cardiac, respiratory, lymph, extremities, GI, GU, Musculoskeletal, constitutional, breasts, reproductive, HEENT.  All pertinent positives are noted in the HPI.  All others are negative.   PHYSICAL EXAM:  Vitals with BMI 06/23/2017  Height 5' 10"   Weight 238 lbs  BMI 27.03  Systolic 500  Diastolic 938  Pulse 75  Respirations 20   General: Alert and oriented, in no acute distress. HEENT: Head is normocephalic. Extraocular movements are intact. There is a little erythemaa with a small ulcer in the distal right nasal cavity but no active bleeding at this time. Oral cavity and oropharynx are clear. Neck: Neck is supple, no palpable cervical or supraclavicular lymphadenopathy. Heart: Regular in rate and rhythm with no murmurs, rubs, or gallops. Chest: Clear to auscultation bilaterally, with no rhonchi, wheezes, or rales. Abdomen: Soft, nontender, nondistended, with no rigidity or guarding. Extremities: No cyanosis or edema. Lymphatics: see Neck Exam Skin: No concerning lesions. Musculoskeletal: Symmetric strength and muscle tone throughout. Neurologic: Cranial nerves II through XII are grossly intact. No obvious focalities. Speech is fluent. Coordination is intact. Psychiatric: Judgment and insight are intact. Affect is appropriate. Breasts: At the 8:30 position of the left breast there is some thickening which extends for about 3 cm. No palpable masses in the left axilla. No other palpable masses appreciated in the right breast or axilla.   ECOG = 0  0 - Asymptomatic (Fully active, able to carry on all predisease activities without restriction)  1 - Symptomatic but completely ambulatory (Restricted in physically strenuous activity  but ambulatory and able to carry out work of a light or sedentary nature. For example, light housework, office work)  2 - Symptomatic, <50% in bed during the day (Ambulatory and capable of all self care but unable to carry out any work activities. Up and about more than 50% of waking hours)  3 - Symptomatic, >50% in bed, but not bedbound (Capable of only limited self-care, confined to bed or chair 50% or more of waking hours)  4 - Bedbound (Completely disabled. Cannot carry on any self-care. Totally confined to bed or chair)  5 - Death   Eustace Pen MM, Creech RH, Tormey DC, et al. [phone]). "Toxicity and response criteria of the [facility]". [place] Oncol. 5 (6): 649-55   LABORATORY DATA:  Lab Results  Component Value Date   WBC 7.9 06/23/2017   HGB 14.3 06/23/2017   HCT 43.7 06/23/2017   MCV 87.4 06/23/2017   PLT 262 06/23/2017   CMP     Component Value Date/Time   NA 140 06/23/2017 0846   K 4.1 06/23/2017 0846   CL 109 06/23/2017 0846   CO2 24 06/23/2017 0846   GLUCOSE 97 06/23/2017 0846   BUN 16 06/23/2017 0846   CREATININE 0.79 06/23/2017 0846   CREATININE 0.59 05/10/2017 0942   CALCIUM 10.4 06/23/2017 0846   PROT 7.3 06/23/2017 0846   ALBUMIN 3.9 06/23/2017 0846   AST 18 06/23/2017 0846   ALT 18 06/23/2017 0846  ALKPHOS 159 (H) 06/23/2017 0846   BILITOT 0.4 06/23/2017 0846   GFRNONAA >60 06/23/2017 0846   GFRAA >60 06/23/2017 0846         RADIOGRAPHY: Mr Lumbar Spine Wo Contrast  Result Date: 06/12/2017 CLINICAL DATA:  Low back and right leg pain for 3 months. EXAM: MRI LUMBAR SPINE WITHOUT CONTRAST TECHNIQUE: Multiplanar, multisequence MR imaging of the lumbar spine was performed. No intravenous contrast was administered. COMPARISON:  None. FINDINGS: Segmentation: 5 lumbar type vertebral bodies. The last full intervertebral disc space is labeled L5-S1. Alignment:  Normal overall alignment. Vertebrae:  Normal marrow signal.  No bone lesions  or fracture. Conus medullaris and cauda equina: Conus extends to the T12-L1 level. Conus and cauda equina appear normal. Paraspinal and other soft tissues: No significant findings Disc levels: L1-2:  No significant findings. L2-3: Mild to moderate facet disease and diffuse bulging annulus with mild bilateral lateral recess encroachment, right slightly greater than left. There is also mild bilateral foraminal encroachment. L3-4: Mild bulging annulus and moderate facet disease with mild bilateral lateral recess stenosis and mild right foraminal encroachment. L4-5: Diffuse bulging annulus and moderate to advanced facet disease with mild bilateral lateral recess encroachment and mild bilateral foraminal encroachment. No significant spinal stenosis. Small synovial cysts noted on the left side but this projects posterolaterally. L5-S1:  No significant findings. IMPRESSION: 1. Diffuse bulging annulus and moderate facet disease at L2-3 with mild bilateral lateral recess stenosis and mild bilateral foraminal stenosis. 2. Mild bilateral lateral recess stenosis and mild right foraminal encroachment at L3-4. 3. Mild bilateral lateral recess encroachment and bilateral foraminal encroachment at L4-5. Electronically Signed   By: Marijo Sanes M.D.   On: 06/12/2017 22:12   Ct Biopsy  Result Date: 06/18/2017 INDICATION: Suspected RIGHT sacroiliac joint dysfunction EXAM: RIGHT SI JOINT INJECTION WITH ANESTHETIC ONLY MEDICATIONS: 0.5% SENSORCAINE ANESTHESIA/SEDATION: NO CONSCIOUS SEDATION WAS EMPLOYED. FLUOROSCOPY TIME:  Estimated CT DI of 45 mGy). COMPLICATIONS: None immediate. PROCEDURE: Informed written consent was obtained from the patient after a thorough discussion of the procedural risks, benefits and alternatives. All questions were addressed. Maximal Sterile Barrier Technique was utilized including mask, sterile gloves, sterile drape, hand hygiene and skin antiseptic. A timeout was performed prior to the initiation of the  procedure. Patient was placed prone on the CT table. Localization of the RIGHT SI joint was performed. A 22 gauge needle was placed in the joint from posterior approach. Contrast injection confirmed intra-articular spread without vascular uptake. I injected 2.5 mL of 0.5% Sensorcaine. Postprocedure the patient reports "the pain is not as bad." She was not completely pain-free however. She was able ambulate without difficulty postprocedure. IMPRESSION: Technically successful RIGHT sacroiliac joint injection with anesthetic only. Patient reports partial relief of her typical pain. Electronically Signed   By: Staci Righter M.D.   On: 06/18/2017 14:10   Mm Digital Diagnostic Unilat L  Result Date: 06/10/2017 CLINICAL DATA:  62 year old female recall from screening mammogram dated 06/07/2017 for left breast calcifications. EXAM: DIGITAL DIAGNOSTIC LEFT MAMMOGRAM WITH CAD COMPARISON:  Previous exam(s). ACR Breast Density Category b: There are scattered areas of fibroglandular density. FINDINGS: Grouped calcifications in the lower inner left breast at middle depth demonstrate a pleomorphic morphology in span approximately 7 x 7 x 7 mm. Mammographic images were processed with CAD. IMPRESSION: Suspicious, grouped left breast calcifications for which stereotactic biopsy is recommended. RECOMMENDATION: Stereotactic biopsy of suspicious left breast calcifications. The patient has been scheduled to return for biopsy on 06/14/2017. I have  discussed the findings and recommendations with the patient. Results were also provided in writing at the conclusion of the visit. If applicable, a reminder letter will be sent to the patient regarding the next appointment. BI-RADS CATEGORY  4: Suspicious. Electronically Signed   By: Kristopher Oppenheim M.D.   On: 06/10/2017 12:08   Mm Digital Screening Bilateral  Result Date: 06/07/2017 CLINICAL DATA:  Screening. EXAM: DIGITAL SCREENING BILATERAL MAMMOGRAM WITH CAD COMPARISON:  Previous  exam(s). ACR Breast Density Category b: There are scattered areas of fibroglandular density. FINDINGS: In the left breast, calcifications warrant further evaluation. In the right breast, no findings suspicious for malignancy. Images were processed with CAD. IMPRESSION: Further evaluation is suggested for calcifications in the left breast. RECOMMENDATION: Diagnostic mammogram of the left breast. (Code:[id number]) The patient will be contacted regarding the findings, and additional imaging will be scheduled. BI-RADS CATEGORY  0: Incomplete. Need additional imaging evaluation and/or prior mammograms for comparison. Electronically Signed   By: Lillia Mountain M.D.   On: 06/07/2017 10:25   [place] Axilla Left  Result Date: 06/17/2017 CLINICAL DATA:  62 year old patient recently diagnosed with high-grade ductal carcinoma in situ of the left breast with a focus highly suspicious for stromal invasion. Left axillary ultrasound is ordered to exclude left axillary lymphadenopathy. EXAM: ULTRASOUND OF THE LEFT AXILLA COMPARISON:  June 10, 2017 mammogram FINDINGS: On physical exam, no lymphadenopathy is palpated in the left axilla. Ultrasound is performed, showing morphologically normal left axillary lymph nodes. No enlarged lymph nodes or suspicious cortical thickening is seen. Axilla appears normal. IMPRESSION: Negative for left axillary lymphadenopathy. RECOMMENDATION: Treatment plan for known left breast cancer. I have discussed the findings and recommendations with the patient. Results were also provided in writing at the conclusion of the visit. If applicable, a reminder letter will be sent to the patient regarding the next appointment. BI-RADS CATEGORY  1: Negative. Electronically Signed   By: Curlene Dolphin M.D.   On: 06/17/2017 10:34   Mm Clip Placement Left  Result Date: 06/14/2017 CLINICAL DATA:  Status post stereotactic biopsy today for left breast calcifications. EXAM: DIAGNOSTIC LEFT MAMMOGRAM POST STEREOTACTIC  BIOPSY COMPARISON:  Previous exam(s). FINDINGS: Mammographic images were obtained following stereotactic guided biopsy of calcifications within the lower inner quadrant of the left breast. At the conclusion of the procedure, a coil shaped tissue marker was placed at the biopsy site. The clip was not initially seen so a second clip was placed. IMPRESSION: Two clips placed at today's SINGLE biopsy site. Both clips are adequately positioned at the medial aspects of the targeted calcifications. Clips are located 7 mm medial to the few residual calcifications. Final Assessment: Post Procedure Mammograms for Marker Placement Electronically Signed   By: Franki Cabot M.D.   On: 06/14/2017 11:36   Mm Lt Breast Bx W Loc Dev 1st Lesion Image Bx Spec Stereo Guide  Addendum Date: 06/16/2017   ADDENDUM REPORT: 06/16/2017 13:34 ADDENDUM: Pathology revealed HIGH GRADE DUCTAL CARCINOMA IN SITU WITH CALCIFICATIONS of the Left breast, lower inner quadrant. There is a focus highly suspicious for stromal invasion. This was found to be concordant by Dr. Franki Cabot. Pathology results were discussed with the patient by telephone. The patient reported doing well after the biopsy with tenderness at the site. Post biopsy instructions and care were reviewed and questions were answered. The patient was encouraged to call [facility] for any additional concerns. The patient was referred to [facility]  [place] on June 23, 2017. A Left axillary ultrasound is scheduled on June 17, 2016 due to the suspicion for invasive disease. Pathology results reported by Terie Purser, RN on 06/16/2017. Electronically Signed   By: Franki Cabot M.D.   On: 06/16/2017 13:34   Result Date: 06/16/2017 CLINICAL DATA:  Patient with indeterminate left breast calcifications presents today for stereotactic biopsy. EXAM: LEFT BREAST STEREOTACTIC CORE NEEDLE BIOPSY  COMPARISON:  Previous exams. FINDINGS: The patient and I discussed the procedure of stereotactic-guided biopsy including benefits and alternatives. We discussed the high likelihood of a successful procedure. We discussed the risks of the procedure including infection, bleeding, tissue injury, clip migration, and inadequate sampling. Informed written consent was given. The usual time out protocol was performed immediately prior to the procedure. Using sterile technique and 1% Lidocaine as local anesthetic, under stereotactic guidance, a 9 gauge vacuum assisted device was used to perform core needle biopsy of calcifications in the lower inner quadrant of the left breast using a medial approach. Specimen radiograph was performed showing calcifications. Specimens with calcifications are identified for pathology. Lesion quadrant: Lower inner quadrant At the conclusion of the procedure, a coil shaped tissue marker clip was deployed into the biopsy cavity. Follow-up 2-view mammogram was performed and dictated separately. IMPRESSION: Stereotactic-guided biopsy of left breast calcifications. No apparent complications. Electronically Signed: By: Franki Cabot M.D. On: 06/14/2017 11:22      IMPRESSION/PLAN: Left Breast Cancer  Tumor board consensus is to proceed with lumpectomy with sentinel lymph node biopsy, adjuvant radiotherapy, and anti-estrogen therapy. She will likely undergo surgery next week with Dr. Donne Hazel.  It was a pleasure meeting the patient today. We discussed the risks, benefits, and side effects of radiotherapy. I recommend radiotherapy to the left breast to reduce her risk of locoregional recurrence by 2/3.  We discussed that radiation would take approximately 4 weeks to complete and that I would give the patient a few weeks to heal following surgery before starting treatment planning. We spoke about acute effects including skin irritation and fatigue as well as much less common late effects  including internal organ injury or irritation. We spoke about the latest technology that is used to minimize the risk of late effects for patients undergoing radiotherapy to the breast or chest wall. No guarantees of treatment were given. The patient is enthusiastic about proceeding with treatment. I look forward to participating in the patient's care.  I will await her referral back to me for postoperative follow-up and eventual CT simulation/treatment planning.  Given the patient's nose bleed concern, I will make a referral to ENT for evaluation per her request. I assured her it is unlikely this is a cancer related symptom, but the ENT may help with symptom management and providing more reassurance upon evaluation.     __________________________________________   Eppie Gibson, MD  This document serves as a record of services personally performed by Eppie Gibson, MD. It was created on her behalf by Rae Lips, a trained medical scribe. The creation of this record is based on the scribe's personal observations and the provider's statements to them. This document has been checked and approved by the attending provider.

## 2017-06-23 NOTE — Progress Notes (Signed)
[place]  [phone] [phone]) [phone]     ID: Katie Robinson DOB: 1956/05/03  MR#: [id number]  HYW#:[id number]  Patient Care Team: Katie Robinson as PCP - General (Family Medicine) Robinson, Katie Dad, MD as Consulting Physician (Oncology) Katie Mayer, MD as Consulting Physician (Gastroenterology) OTHER MD:  CHIEF COMPLAINT: DCIS estrogen receptor positive  CURRENT TREATMENT: Awaiting definitive surgery   HISTORY OF CURRENT ILLNESS: Katie Robinson had routine screening mammography on 06/07/2017 showing a possible abnormality in the left breast. She underwent unilateral left breast diagnostic mammography with tomography and left axilla ultrasonography on 06/17/2017 at [facility] on 06/10/2017 showing: Suspicious, grouped left breast calcifications. Negative for left axillary lymphadenopathy.  Accordingly on 06/14/2017 she proceeded to biopsy of the left breast area in question. 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 Focus Highly suspicious for stromal invasion. Prognostic indicators significant for: estrogen receptor, 50% positive with moderate staining intensity and progesterone receptor, 0% negative.  The patient's subsequent history is as detailed below.  INTERVAL HISTORY: Katie Robinson was evaluated in the multidisciplinary breast cancer clinic on 06/23/2017 accompanied by her daughter    and her sister     . Katie Robinson's case was also presented at the multidisciplinary breast cancer conference on the same day accompanied by her sister, Katie Robinson, and her daughter, Katie Robinson. At that time a preliminary plan was proposed: Breast conserving surgery with sentinel lymph node sampling given the suspicious area of possible invasion, followed by radiation, then consideration of antiestrogens.  REVIEW OF SYSTEMS: Katie Robinson reports that she works  during the week. For exercise, she occasionally likes to walk for a couple of miles. She reports that she has a history of headaches. She denies having diabetes. She notes that she has asthma that she follows up with her PCP. She reports that she does not have a cardiologist, pulmonologist, dermatologist, or gynecologist. She sees gastrologist Dr. Silvano Robinson for ulcerative colitis. She reports that she doesn't have sarcoidosis.  She denies unusual headaches, visual changes, nausea, vomiting, or dizziness. There has been no unusual cough, phlegm production, or pleurisy. This been no change in bowel or bladder habits. She denies unexplained fatigue or unexplained weight loss, bleeding, rash, or fever. A detailed review of systems was otherwise stable.    PAST MEDICAL HISTORY: Past Medical History:  Diagnosis Date  . Allergy    SEASONAL  . Anemia   . Arthralgia   . Asthma   . Chronic headache    many years  . Family history of premature coronary artery disease    father  . Former smoker    10 pack year history  . GERD (gastroesophageal reflux disease)    mild, occasional  . H/O bone density study 11/2015   normal  . H/O cardiovascular stress test 2009  . Hx of adenomatous and sessile serrated colonic polyps 05/24/2014  . Iron deficiency 11/29/2015  . Obesity   . Pectus excavatum    mild, left asymmetric  . S/P hysterectomy    due to fibroids  . Seasonal allergic rhinitis   . UC (ulcerative colitis) ([place]) 2015   Dr. [name]  . Urinary tract infection 2013  . Vitamin D deficiency 07/18/2014  . Wears glasses     PAST SURGICAL HISTORY: Past Surgical History:  Procedure Laterality Date  . COLONOSCOPY  02/2016   polyps; Dr. Silvano Robinson  . FOOT TENDON  SURGERY Left 18-Aug-2011  . NAVEL REMOVED     AS A CHILD  . OTHER SURGICAL HISTORY     biopsy for possible sarcoidosis  . POLYPECTOMY    . SKIN BIOPSY    . TONSILLECTOMY    . TOTAL ABDOMINAL HYSTERECTOMY  2000/08/17   total; Dr. Molli Robinson  . WISDOM TOOTH EXTRACTION      FAMILY HISTORY Family History  Problem Relation Age of Onset  . Asthma Father   . Heart disease Father 57       died of MI mid 08/17/2022  . Hypertension Sister   . Asthma Sister   . Hypertension Mother   . Aneurysm Mother        brain  . Alzheimer's disease Mother   . Heart disease Mother   . Cancer Neg Hx   . Stroke Neg Hx   . Diabetes Neg Hx   . Colon cancer Neg Hx   . Colon polyps Neg Hx   . Crohn's disease Neg Hx   . Ulcerative colitis Neg Hx   Mother passed at due to brain aneurysm and she also had Alzhimer's. Father passed at 19 due to heart attack and asthma. She has 1 living sister with good health. No history with breast or ovarian cancer in the family.  GYNECOLOGIC HISTORY:  No LMP recorded. Patient has had a hysterectomy. Menarche: 62 years old Age at first live birth: 62 years old GP: GxP2 HRT: Did not use hormone replacement She has a total hysterectomy with bilateral salpingo-oophorectomy in 2000/08/17 due to fibroids and a chocolate cyst on her ovaries.   SOCIAL HISTORY:  She works as a Scientist, research (medical). She is divorced. Her son lives with her and is unmarried. He works at [employer] as a Technical brewer. Her daughter is a Chiropractor at [employer]. She has 1 grandchild. She belongs to [employer].    ADVANCED DIRECTIVES: Not in place; at the 06/23/2017 visit the patient was given the appropriate documents to complete on notarized at her discretion   HEALTH MAINTENANCE: Social History   Tobacco Use  . Smoking status: Former Smoker    Types: Cigarettes    Last attempt to quit: 06/15/1988    Years since quitting: 29.0  . Smokeless tobacco: Never Used  Substance Use Topics  . Alcohol use: Yes    Alcohol/week: 0.0 oz    Comment: rarely  . Drug use: No     Colonoscopy: 02/2016/ Katie Robinson  PAP: Status post Hysterectomy/ BSO 08-17-2000  Bone density: 12/02/2015 T-score +0.2 normal   No Known  Allergies  Current Outpatient Medications  Medication Sig Dispense Refill  . albuterol (PROVENTIL HFA;VENTOLIN HFA) 108 (90 Base) MCG/ACT inhaler Inhale 2 puffs into the lungs every 6 (six) hours as needed for wheezing or shortness of breath. 18 g 1  . albuterol (PROVENTIL) (2.5 MG/3ML) 0.083% nebulizer solution Take 3 mLs (2.5 mg total) by nebulization every 6 (six) hours as needed for wheezing or shortness of breath. (Patient not taking: Reported on 05/10/2017) 75 mL 1  . HUMIRA 40 MG/0.8ML PSKT INJECT 0.8 ML (40 MG) UNDER THE SKIN EVERY 14 DAYS 2 each 6  . OVER THE COUNTER MEDICATION 1,000 mg. Vitamin d    . Probiotic Product (VSL#3) CAPS Take 1 capsule by mouth daily. (Patient not taking: Reported on 05/10/2017) 90 capsule 3   No current facility-administered medications for this visit.     OBJECTIVE: Middle-aged African-American woman who appears well  Vitals:   06/23/17 0913  BP: (!) 157/100  Pulse: 75  Resp: 20  Temp: 98.9 F (37.2 C)  SpO2: 100%     Body mass index is 34.15 kg/m.   Wt Readings from Last 3 Encounters:  06/23/17 238 lb (108 kg)  05/10/17 241 lb 3.2 oz (109.4 kg)  10/02/16 233 lb 2 oz (105.7 kg)      ECOG FS:0 - Asymptomatic  Ocular: Sclerae unicteric, pupils round and equal Ear-nose-throat: Oropharynx clear and moist Lymphatic: No cervical or supraclavicular adenopathy Lungs no rales or rhonchi Heart regular rate and rhythm Abd soft, nontender, positive bowel sounds MSK no focal spinal tenderness, no joint edema Neuro: non-focal, well-oriented, appropriate affect Breasts: The right breast is benign.  The left breast is status post recent biopsy.  There is a minimal ecchymosis.  There is no palpable mass and there are no skin or nipple changes of concern.  Both axilla are benign  LAB RESULTS:  CMP     Component Value Date/Time   NA 140 06/23/2017 0846   K 4.1 06/23/2017 0846   CL 109 06/23/2017 0846   CO2 24 06/23/2017 0846   GLUCOSE 97  06/23/2017 0846   BUN 16 06/23/2017 0846   CREATININE 0.79 06/23/2017 0846   CREATININE 0.59 05/10/2017 0942   CALCIUM 10.4 06/23/2017 0846   PROT 7.3 06/23/2017 0846   ALBUMIN 3.9 06/23/2017 0846   AST 18 06/23/2017 0846   ALT 18 06/23/2017 0846   ALKPHOS 159 (H) 06/23/2017 0846   BILITOT 0.4 06/23/2017 0846   GFRNONAA >60 06/23/2017 0846   GFRAA >60 06/23/2017 0846    No results found for: TOTALPROTELP, ALBUMINELP, A1GS, A2GS, BETS, BETA2SER, GAMS, MSPIKE, SPEI  No results found for: KPAFRELGTCHN, LAMBDASER, [facility]  Lab Results  Component Value Date   WBC 7.9 06/23/2017   NEUTROABS 2.5 06/23/2017   HGB 14.3 06/23/2017   HCT 43.7 06/23/2017   MCV 87.4 06/23/2017   PLT 262 06/23/2017    _0 @  No results found for: LABCA2  No components found for: LMBEML544  No results for input(s): INR in the last 168 hours.  No results found for: LABCA2  No results found for: BEE100  No results found for: FHQ197  No results found for: JOI325  No results found for: CA2729  No components found for: HGQUANT  No results found for: CEA1 / No results found for: CEA1   No results found for: AFPTUMOR  No results found for: CHROMOGRNA  No results found for: PSA1  Appointment on 06/23/2017  Component Date Value Ref Range Status  . Sodium 06/23/2017 140  136 - 145 mmol/L Final  . Potassium 06/23/2017 4.1  3.3 - 4.7 mmol/L Final  . Chloride 06/23/2017 109  98 - 109 mmol/L Final  . CO2 06/23/2017 24  22 - 29 mmol/L Final  . Glucose, Bld 06/23/2017 97  70 - 140 mg/dL Final  . BUN 06/23/2017 16  7 - 26 mg/dL Final  . Creatinine, Ser 06/23/2017 0.79  0.60 - 1.10 mg/dL Final  . Calcium 06/23/2017 10.4  8.4 - 10.4 mg/dL Final  . Total Protein 06/23/2017 7.3  6.4 - 8.3 g/dL Final  . Albumin 06/23/2017 3.9  3.5 - 5.0 g/dL Final  . AST 06/23/2017 18  5 - 34 U/L Final  . ALT 06/23/2017 18  0 - 55 U/L Final  . Alkaline Phosphatase 06/23/2017 159* 40 - 150 U/L Final    . Total Bilirubin 06/23/2017 0.4  0.2 - 1.2 mg/dL Final  . GFR calc non Af Amer 06/23/2017 >60  >60 mL/min Final  . GFR calc Af Amer 06/23/2017 >60  >60 mL/min Final   Comment: (NOTE) The eGFR has been calculated using the CKD EPI equation. This calculation has not been validated in all clinical situations. eGFR's persistently <60 mL/min signify possible Chronic Kidney Disease.   Katie Robinson 06/23/2017 7  3 - 11 Final   Performed at [facility], [place] [address], [place], [place] [address]  . WBC 06/23/2017 7.9  3.9 - 10.3 K/uL Final  . RBC 06/23/2017 5.00  3.70 - 5.45 MIL/uL Final  . Hemoglobin 06/23/2017 14.3  11.6 - 15.9 g/dL Final  . HCT 06/23/2017 43.7  34.8 - 46.6 % Final  . MCV 06/23/2017 87.4  79.5 - 101.0 fL Final  . MCH 06/23/2017 28.6  25.1 - 34.0 pg Final  . MCHC 06/23/2017 32.7  31.5 - 36.0 g/dL Final  . RDW 06/23/2017 13.2  11.2 - 16.1 % Final  . Platelets 06/23/2017 262  145 - 400 K/uL Final  . Neutrophils Relative % 06/23/2017 31  % Final  . Neutro Abs 06/23/2017 2.5  1.5 - 6.5 K/uL Final  . Abs Granulocyte 06/23/2017 2.5  1.5 - 6.5 K/uL Final  . Lymphocytes Relative 06/23/2017 56  % Final  . Lymphs Abs 06/23/2017 4.5* 0.9 - 3.3 K/uL Final  . Monocytes Relative 06/23/2017 7  % Final  . Monocytes Absolute 06/23/2017 0.5  0.1 - 0.9 K/uL Final  . Eosinophils Relative 06/23/2017 5  % Final  . Eosinophils Absolute 06/23/2017 0.4  0.0 - 0.5 K/uL Final  . Basophils Relative 06/23/2017 1  % Final  . Basophils Absolute 06/23/2017 0.0  0.0 - 0.1 K/uL Final   Performed at [facility], [place] [address], [place], [place] [address]    (this displays the last labs from the last 3 days)  No results found for: TOTALPROTELP, ALBUMINELP, A1GS, A2GS, BETS, BETA2SER, GAMS, MSPIKE, SPEI (this displays SPEP labs)  No results found for: KPAFRELGTCHN, LAMBDASER, KAPLAMBRATIO (kappa/lambda light chains)  No results found for: HGBA,  HGBA2QUANT, HGBFQUANT, HGBSQUAN (Hemoglobinopathy evaluation)   No results found for: LDH  Lab Results  Component Value Date   IRON 45 04/05/2016   TIBC 141 (L) 04/05/2016   IRONPCTSAT 32 (H) 04/05/2016   (Iron and TIBC)  Lab Results  Component Value Date   FERRITIN 68 04/05/2016    Urinalysis    Component Value Date/Time   COLORURINE YELLOW 04/04/2016 1552   APPEARANCEUR HAZY (A) 04/04/2016 1552   LABSPEC 1.020 05/10/2017 0912   PHURINE 7.0 04/04/2016 1552   GLUCOSEU NEGATIVE 04/04/2016 [place] 04/04/2016 1552   BILIRUBINUR negative 05/10/2017 0912   BILIRUBINUR n 04/29/2015 1218   KETONESUR negative 05/10/2017 0912   KETONESUR NEGATIVE 04/04/2016 1552   PROTEINUR negative 05/10/2017 0912   PROTEINUR NEGATIVE 04/04/2016 1552   UROBILINOGEN 0.2 04/29/2015 1218   UROBILINOGEN 1.0 04/24/2015 2029   NITRITE Negative 05/10/2017 0912   NITRITE NEGATIVE 04/04/2016 1552   LEUKOCYTESUR Trace (A) 05/10/2017 0912     STUDIES: Mr Lumbar Spine Wo Contrast  Result Date: 06/12/2017 CLINICAL DATA:  Low back and right leg pain for 3 months. EXAM: MRI LUMBAR SPINE WITHOUT CONTRAST TECHNIQUE: Multiplanar, multisequence MR imaging of the lumbar spine was performed. No intravenous contrast was administered. COMPARISON:  None. FINDINGS: Segmentation: 5 lumbar type vertebral bodies. The last full  intervertebral disc space is labeled L5-S1. Alignment:  Normal overall alignment. Vertebrae:  Normal marrow signal.  No bone lesions or fracture. Conus medullaris and cauda equina: Conus extends to the T12-L1 level. Conus and cauda equina appear normal. Paraspinal and other soft tissues: No significant findings Disc levels: L1-2:  No significant findings. L2-3: Mild to moderate facet disease and diffuse bulging annulus with mild bilateral lateral recess encroachment, right slightly greater than left. There is also mild bilateral foraminal encroachment. L3-4: Mild bulging annulus and  moderate facet disease with mild bilateral lateral recess stenosis and mild right foraminal encroachment. L4-5: Diffuse bulging annulus and moderate to advanced facet disease with mild bilateral lateral recess encroachment and mild bilateral foraminal encroachment. No significant spinal stenosis. Small synovial cysts noted on the left side but this projects posterolaterally. L5-S1:  No significant findings. IMPRESSION: 1. Diffuse bulging annulus and moderate facet disease at L2-3 with mild bilateral lateral recess stenosis and mild bilateral foraminal stenosis. 2. Mild bilateral lateral recess stenosis and mild right foraminal encroachment at L3-4. 3. Mild bilateral lateral recess encroachment and bilateral foraminal encroachment at L4-5. Electronically Signed   By: Marijo Sanes M.D.   On: 06/12/2017 22:12   Ct Biopsy  Result Date: 06/18/2017 INDICATION: Suspected RIGHT sacroiliac joint dysfunction EXAM: RIGHT SI JOINT INJECTION WITH ANESTHETIC ONLY MEDICATIONS: 0.5% SENSORCAINE ANESTHESIA/SEDATION: NO CONSCIOUS SEDATION WAS EMPLOYED. FLUOROSCOPY TIME:  Estimated CT DI of 45 mGy). COMPLICATIONS: None immediate. PROCEDURE: Informed written consent was obtained from the patient after a thorough discussion of the procedural risks, benefits and alternatives. All questions were addressed. Maximal Sterile Barrier Technique was utilized including mask, sterile gloves, sterile drape, hand hygiene and skin antiseptic. A timeout was performed prior to the initiation of the procedure. Patient was placed prone on the CT table. Localization of the RIGHT SI joint was performed. A 22 gauge needle was placed in the joint from posterior approach. Contrast injection confirmed intra-articular spread without vascular uptake. I injected 2.5 mL of 0.5% Sensorcaine. Postprocedure the patient reports "the pain is not as bad." She was not completely pain-free however. She was able ambulate without difficulty postprocedure. IMPRESSION:  Technically successful RIGHT sacroiliac joint injection with anesthetic only. Patient reports partial relief of her typical pain. Electronically Signed   By: Staci Righter M.D.   On: 06/18/2017 14:10   Mm Digital Diagnostic Unilat L  Result Date: 06/10/2017 CLINICAL DATA:  62 year old female recall from screening mammogram dated 06/07/2017 for left breast calcifications. EXAM: DIGITAL DIAGNOSTIC LEFT MAMMOGRAM WITH CAD COMPARISON:  Previous exam(s). ACR Breast Density Category b: There are scattered areas of fibroglandular density. FINDINGS: Grouped calcifications in the lower inner left breast at middle depth demonstrate a pleomorphic morphology in span approximately 7 x 7 x 7 mm. Mammographic images were processed with CAD. IMPRESSION: Suspicious, grouped left breast calcifications for which stereotactic biopsy is recommended. RECOMMENDATION: Stereotactic biopsy of suspicious left breast calcifications. The patient has been scheduled to return for biopsy on 06/14/2017. I have discussed the findings and recommendations with the patient. Results were also provided in writing at the conclusion of the visit. If applicable, a reminder letter will be sent to the patient regarding the next appointment. BI-RADS CATEGORY  4: Suspicious. Electronically Signed   By: Kristopher Oppenheim M.D.   On: 06/10/2017 12:08   Mm Digital Screening Bilateral  Result Date: 06/07/2017 CLINICAL DATA:  Screening. EXAM: DIGITAL SCREENING BILATERAL MAMMOGRAM WITH CAD COMPARISON:  Previous exam(s). ACR Breast Density Category b: There are scattered areas  of fibroglandular density. FINDINGS: In the left breast, calcifications warrant further evaluation. In the right breast, no findings suspicious for malignancy. Images were processed with CAD. IMPRESSION: Further evaluation is suggested for calcifications in the left breast. RECOMMENDATION: Diagnostic mammogram of the left breast. (Code:[id number]) The patient will be contacted regarding the  findings, and additional imaging will be scheduled. BI-RADS CATEGORY  0: Incomplete. Need additional imaging evaluation and/or prior mammograms for comparison. Electronically Signed   By: Lillia Mountain M.D.   On: 06/07/2017 10:25   [place] Axilla Left  Result Date: 06/17/2017 CLINICAL DATA:  61 year old patient recently diagnosed with high-grade ductal carcinoma in situ of the left breast with a focus highly suspicious for stromal invasion. Left axillary ultrasound is ordered to exclude left axillary lymphadenopathy. EXAM: ULTRASOUND OF THE LEFT AXILLA COMPARISON:  June 10, 2017 mammogram FINDINGS: On physical exam, no lymphadenopathy is palpated in the left axilla. Ultrasound is performed, showing morphologically normal left axillary lymph nodes. No enlarged lymph nodes or suspicious cortical thickening is seen. Axilla appears normal. IMPRESSION: Negative for left axillary lymphadenopathy. RECOMMENDATION: Treatment plan for known left breast cancer. I have discussed the findings and recommendations with the patient. Results were also provided in writing at the conclusion of the visit. If applicable, a reminder letter will be sent to the patient regarding the next appointment. BI-RADS CATEGORY  1: Negative. Electronically Signed   By: Curlene Dolphin M.D.   On: 06/17/2017 10:34   Mm Clip Placement Left  Result Date: 06/14/2017 CLINICAL DATA:  Status post stereotactic biopsy today for left breast calcifications. EXAM: DIAGNOSTIC LEFT MAMMOGRAM POST STEREOTACTIC BIOPSY COMPARISON:  Previous exam(s). FINDINGS: Mammographic images were obtained following stereotactic guided biopsy of calcifications within the lower inner quadrant of the left breast. At the conclusion of the procedure, a coil shaped tissue marker was placed at the biopsy site. The clip was not initially seen so a second clip was placed. IMPRESSION: Two clips placed at today's SINGLE biopsy site. Both clips are adequately positioned at the medial  aspects of the targeted calcifications. Clips are located 7 mm medial to the few residual calcifications. Final Assessment: Post Procedure Mammograms for Marker Placement Electronically Signed   By: Franki Cabot M.D.   On: 06/14/2017 11:36   Mm Lt Breast Bx W Loc Dev 1st Lesion Image Bx Spec Stereo Guide  Addendum Date: 06/16/2017   ADDENDUM REPORT: 06/16/2017 13:34 ADDENDUM: Pathology revealed HIGH GRADE DUCTAL CARCINOMA IN SITU WITH CALCIFICATIONS of the Left breast, lower inner quadrant. There is a focus highly suspicious for stromal invasion. This was found to be concordant by Dr. Franki Cabot. Pathology results were discussed with the patient by telephone. The patient reported doing well after the biopsy with tenderness at the site. Post biopsy instructions and care were reviewed and questions were answered. The patient was encouraged to call [facility] for any additional concerns. The patient was referred to [facility] on June 23, 2017. A Left axillary ultrasound is scheduled on June 17, 2016 due to the suspicion for invasive disease. Pathology results reported by Terie Purser, RN on 06/16/2017. Electronically Signed   By: Franki Cabot M.D.   On: 06/16/2017 13:34   Result Date: 06/16/2017 CLINICAL DATA:  Patient with indeterminate left breast calcifications presents today for stereotactic biopsy. EXAM: LEFT BREAST STEREOTACTIC CORE NEEDLE BIOPSY COMPARISON:  Previous exams. FINDINGS: The patient and I discussed the procedure of stereotactic-guided  biopsy including benefits and alternatives. We discussed the high likelihood of a successful procedure. We discussed the risks of the procedure including infection, bleeding, tissue injury, clip migration, and inadequate sampling. Informed written consent was given. The usual time out protocol was performed immediately prior to the procedure. Using  sterile technique and 1% Lidocaine as local anesthetic, under stereotactic guidance, a 9 gauge vacuum assisted device was used to perform core needle biopsy of calcifications in the lower inner quadrant of the left breast using a medial approach. Specimen radiograph was performed showing calcifications. Specimens with calcifications are identified for pathology. Lesion quadrant: Lower inner quadrant At the conclusion of the procedure, a coil shaped tissue marker clip was deployed into the biopsy cavity. Follow-up 2-view mammogram was performed and dictated separately. IMPRESSION: Stereotactic-guided biopsy of left breast calcifications. No apparent complications. Electronically Signed: By: Franki Cabot M.D. On: 06/14/2017 11:22    ELIGIBLE FOR AVAILABLE RESEARCH PROTOCOL: No  ASSESSMENT: 62 y.o. [place] woman status post left breast lower inner quadrant biopsy 06/14/2017 for ductal carcinoma in situ, grade 3, with an area suspicious for microinvasion, estrogen receptor positive progesterone receptor negative  (1) definitive surgery pending  (2) adjuvant radiation to follow  (3) antiestrogens to be considered at the completion of local treatment  PLAN: We spent the better part of today's hour-long appointment discussing the biology of her diagnosis and the specifics of her situation. Katie Robinson understands that in noninvasive ductal carcinoma, also called ductal carcinoma in situ ("DCIS") the breast cancer cells remain trapped in the ducts were they started. They cannot travel to a vital organ. For that reason these cancers in themselves are not life-threatening.  If the whole breast is removed then all the ducts are removed and since the cancer cells are trapped in the ducts, the cure rate with mastectomy for noninvasive breast cancer is approximately 99%. Nevertheless we recommend lumpectomy, because there is no survival advantage to mastectomy and because the cosmetic result is generally superior  with breast conservation.  Since the patient is keeping her breasts, there will be some risk of recurrence. The recurrence can only be in the same breast since, again, the cells are trapped in the ducts. There is no connection from one breast to the other. The risk of local recurrence is cut by more than half with radiation, which is standard in this situation.  In estrogen receptor positive cancers like Arrabella's, anti-estrogens can also be considered. They will further reduce the risk of recurrence by one half. In addition anti-estrogens will lower the risk of a new breast cancer developing in either breast, also by one half. That risk approaches 1% per year. Anti-estrogens reduce it to 1/2% per year.  Accordingly the overall plan is for surgery, followed by radiation, then a discussion of anti-estrogens.  Katie Robinson has a good understanding of the overall plan. She agrees with it. She knows the goal of treatment in her case is cure. She will call with any problems that may develop before her next visit here.  Robinson, Katie Dad, MD  06/23/17 10:41 AM Medical Oncology and Hematology [facility] [address] [place], [place] [address] Tel. [phone]    Fax. [phone]  This document serves as a record of services personally performed by Lurline Del, MD. It was created on his behalf by Sheron Nightingale, a trained medical scribe. The creation of this record is based on the scribe's personal observations and the provider's statements to them.   I have reviewed  the above documentation for accuracy and completeness, and I agree with the above.

## 2017-06-23 NOTE — Therapy (Signed)
[place], [place], [address] Phone: [phone]   Fax:  [phone]  Physical Therapy Evaluation  Patient Details  Name: Katie Robinson MRN: [id number] Date of Birth: [birth year] Referring Provider: Dr. Rolm Bookbinder   Encounter Date: 06/23/2017  PT End of Session - 06/23/17 1225    Visit Number  1    Number of Visits  2    Date for PT Re-Evaluation  08/18/17    PT Start Time  6962    PT Stop Time  1144    PT Time Calculation (min)  23 min    Activity Tolerance  Patient tolerated treatment well    Behavior During Therapy  [facility] for tasks assessed/performed       Past Medical History:  Diagnosis Date  . Allergy    SEASONAL  . Anemia   . Arthralgia   . Asthma   . Chronic headache    many years  . Family history of premature coronary artery disease    father  . Former smoker    10 pack year history  . GERD (gastroesophageal reflux disease)    mild, occasional  . H/O bone density study 11/2015   normal  . H/O cardiovascular stress test 2009  . Hx of adenomatous and sessile serrated colonic polyps 05/24/2014  . Iron deficiency 11/29/2015  . Obesity   . Pectus excavatum    mild, left asymmetric  . S/P hysterectomy    due to fibroids  . Seasonal allergic rhinitis   . UC (ulcerative colitis) ([place]) 2015   Dr. Carlean Purl  . Urinary tract infection 2013  . Vitamin D deficiency 07/18/2014  . Wears glasses     Past Surgical History:  Procedure Laterality Date  . COLONOSCOPY  02/2016   polyps; Dr. Silvano Rusk  . FOOT TENDON SURGERY Left 2013  . NAVEL REMOVED     AS A CHILD  . OTHER SURGICAL HISTORY     biopsy for possible sarcoidosis  . POLYPECTOMY    . SKIN BIOPSY    . TONSILLECTOMY    . TOTAL ABDOMINAL HYSTERECTOMY  2002   total; Dr. Molli Posey  . WISDOM TOOTH EXTRACTION      There were no vitals filed for this visit.   Subjective Assessment - 06/23/17 1213    Subjective   Patient reports she is here today to be seen be her medical team for her newly diagnosed left breast cancer.    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 It measures 7 mm and is located in the lower inner quadrant. It is ER positive and PR negative. She also has asthma.    Patient Stated Goals  Reduce lymphedema risk and learn post op shoulder ROM HEP    Currently in Pain?  Yes    Pain Score  1     Pain Location  Back    Pain Orientation  Lower    Pain Descriptors / Indicators  Sharp    Pain Type  Chronic pain    Pain Onset  More than a month ago    Pain Frequency  Intermittent    Aggravating Factors   Walking up and down stairs and getting in/out of the car.    Pain Relieving Factors  Sitting    Multiple Pain Sites  No  [facility] PT Assessment - 06/23/17 0001      Assessment   Medical Diagnosis  Left breast cancer    Referring Provider  Dr. Rolm Bookbinder    Onset Date/Surgical Date  06/07/17    Hand Dominance  Right    Prior Therapy  none      Precautions   Precautions  Other (comment)    Precaution Comments  active cancer      Restrictions   Weight Bearing Restrictions  No      Balance Screen   Has the patient fallen in the past 6 months  No    Has the patient had a decrease in activity level because of a fear of falling?   No    Is the patient reluctant to leave their home because of a fear of falling?   No      Home Environment   Living Environment  Private residence    Living Arrangements  Children 56 y.o. son    Available Help at Discharge  Family      Prior Function   Level of Independence  Independent    Vocation  Full time employment    Printmaker for [employer]  She does not exercise      Cognition   Overall Cognitive Status  Within Functional Limits for tasks assessed      Posture/Postural Control   Posture/Postural  Control  Postural limitations    Postural Limitations  Rounded Shoulders;Forward head      ROM / Strength   AROM / PROM / Strength  AROM;Strength      AROM   AROM Assessment Site  Shoulder;Cervical    Right/Left Shoulder  Right;Left    Right Shoulder Extension  41 Degrees    Right Shoulder Flexion  150 Degrees    Right Shoulder ABduction  160 Degrees    Right Shoulder Internal Rotation  70 Degrees    Right Shoulder External Rotation  79 Degrees    Left Shoulder Extension  53 Degrees    Left Shoulder Flexion  150 Degrees    Left Shoulder ABduction  162 Degrees    Left Shoulder Internal Rotation  60 Degrees    Left Shoulder External Rotation  90 Degrees    Cervical Flexion  WNL    Cervical Extension  WNL    Cervical - Right Side Bend  WNL    Cervical - Left Side Bend  WNL    Cervical - Right Rotation  WNL    Cervical - Left Rotation  WNL      Strength   Overall Strength  Within functional limits for tasks performed        LYMPHEDEMA/ONCOLOGY QUESTIONNAIRE - 06/23/17 1223      Type   Cancer Type  Left breast cancer      Lymphedema Assessments   Lymphedema Assessments  Upper extremities      Right Upper Extremity Lymphedema   10 cm Proximal to Olecranon Process  31.7 cm    Olecranon Process  29.9 cm    10 cm Proximal to Ulnar Styloid Process  23.3 cm    Just Proximal to Ulnar Styloid Process  18.8 cm    Across Hand at [employer]  21.3 cm    At [place] of 2nd Digit  7 cm      Left Upper Extremity Lymphedema   10 cm Proximal to Olecranon Process  29.7 cm  Olecranon Process  28.6 cm    10 cm Proximal to Ulnar Styloid Process  23 cm    Just Proximal to Ulnar Styloid Process  18.3 cm    Across Hand at [employer]  21.3 cm    At [place] of 2nd Digit  6.8 cm          Objective measurements completed on examination: See above findings.     Patient was instructed today in a home exercise program today for post op shoulder range of motion. These included active  assist shoulder flexion in sitting, scapular retraction, wall walking with shoulder abduction, and hands behind head external rotation.  She was encouraged to do these twice a day, holding 3 seconds and repeating 5 times when permitted by her physician.     PT Education - 06/23/17 1224    Education provided  Yes    Education Details  Lymphedema risk reduction and post op shoulder ROM HEP    Person(s) Educated  Patient;Other (comment) Sister, daughter, friend    Methods  Explanation;Demonstration;Handout    Comprehension  Returned demonstration;Verbalized understanding          PT Long Term Goals - 06/23/17 1231      PT LONG TERM GOAL #1   Title  Patient will demonstrate she has regained function and range of motion since surgery.    Time  8    Period  Weeks    Status  New      Breast Clinic Goals - 06/23/17 1231      Patient will be able to verbalize understanding of pertinent lymphedema risk reduction practices relevant to her diagnosis specifically related to skin care.   Time  1    Period  Days    Status  Achieved      Patient will be able to return demonstrate and/or verbalize understanding of the post-op home exercise program related to regaining shoulder range of motion.   Time  1    Period  Days    Status  Achieved      Patient will be able to verbalize understanding of the importance of attending the postoperative After Breast Cancer Class for further lymphedema risk reduction education and therapeutic exercise.   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 It measures 7 mm and is located in the lower inner quadrant. It is ER positive and PR negative. She also has asthma. Her multidisciplinary medical team met prior to her assessments to determine a recommended treatment plan. She is planinng to have a left lumpectomy  and sentinel node biopsy followed by radiation and anti-estrogen therapy. She will benefit from a post op PT assessment to determine needs.    History and Personal Factors relevant to plan of care:  None    Clinical Presentation  Stable    Clinical Decision Making  Low    Rehab Potential  Excellent    Clinical Impairments Affecting Rehab Potential  None    PT Frequency  -- Eval and 1 post op PT visit    PT Treatment/Interventions  ADLs/Self Care Home Management;Therapeutic exercise;Patient/family education    PT Next Visit Plan  Will reassess pt 3-4 weeks post op to determine PT needs    PT Home  Exercise Plan  Post op shoulder ROM HEP    Consulted and Agree with Plan of Care  Patient;Family member/caregiver    Family Member Consulted  Sister, daughter, and friend       Patient will benefit from skilled therapeutic intervention in order to improve the following deficits and impairments:  Pain, Postural dysfunction, Impaired UE functional use, Decreased knowledge of precautions, Decreased range of motion  Visit Diagnosis: 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, estrogen receptor positive ([place]) - Plan: PT plan of care cert/re-cert  Abnormal posture - Plan: PT plan of care cert/re-cert   Patient will follow up at outpatient cancer rehab 3-4 weeks following surgery.  If the patient requires physical therapy at that time, a specific plan will be dictated and sent to the referring physician for approval. The patient was educated today on appropriate basic range of motion exercises to begin post operatively and the importance of attending the After Breast Cancer class following surgery.  Patient was educated today on lymphedema risk reduction practices as it pertains to recommendations that will benefit the patient immediately following surgery.  She verbalized good understanding.     Problem List Patient Active Problem List   Diagnosis Date Noted  . Carcinoma of lower-inner  quadrant of left breast in female, estrogen receptor positive ([employer]) 06/23/2017  . Ductal carcinoma in situ (DCIS) of left breast 06/18/2017  . Need for influenza vaccination 05/10/2017  . Screening for breast cancer 05/10/2017  . Ulcerative colitis with complication ([place]) 63/86/8548  . Mild persistent asthma without complication 83/06/4157  . Allergic rhinitis due to pollen 05/10/2017  . Right hand paresthesia 05/10/2017  . Normocytic anemia   . Iron deficiency 11/29/2015  . Vitamin D deficiency 07/18/2014  . Hx of adenomatous and sessile serrated colonic polyps 05/24/2014    Annia Friendly, PT 06/23/17 12:37 PM  [place], [place], [address] Phone: [phone]   Fax:  [phone]  Name: Katie Robinson MRN: [id number] Date of Birth: 20-Sep-1955

## 2017-06-23 NOTE — Patient Instructions (Signed)

## 2017-06-23 NOTE — Progress Notes (Signed)
Nutrition Assessment  Reason for Assessment:  Pt seen in Breast Clinic  ASSESSMENT:   62 year old with new diagnosis of left breast cancer.  Past medical history of ulcerative colitis.    Patient reports good appetite  Medications:  reviewed  Labs: reviewed  Anthropometrics:   Height: 70 inches Weight: 238 lb BMI: 34   NUTRITION DIAGNOSIS: Food and nutrition related knowledge deficit related to new diagnosis of breast cancer as evidenced by no prior need for nutrition related information.  INTERVENTION:   Discussed and provided packet of information regarding nutritional tips for breast cancer patients.  Questions answered.  Teachback method used.  Contact information provided and patient knows to contact me with questions/concerns.    MONITORING, EVALUATION, and GOAL: Pt will consume a healthy plant based diet to maintain lean body mass throughout treatment.   Katie Robinson B. Zenia Resides, [place], [place] Registered Dietitian [phone] (pager)

## 2017-06-23 NOTE — Telephone Encounter (Signed)
CALLED PATIENT TO INFORM OF ENT APPT. FOR 06-24-17- ARRIVAL TIME - 2 PM @ DR. CHRISTOPHER NEWMAN'S OFFICE ADDRESS [place] PH. NO. - [phone], SPOKE WITH PATIENT AND SHE IS AWARE OF THIS APPT.

## 2017-06-24 ENCOUNTER — Other Ambulatory Visit: Payer: Self-pay | Admitting: *Deleted

## 2017-06-24 ENCOUNTER — Other Ambulatory Visit: Payer: Self-pay | Admitting: Oncology

## 2017-06-24 ENCOUNTER — Telehealth: Payer: Self-pay | Admitting: Oncology

## 2017-06-24 NOTE — Telephone Encounter (Signed)
Scheduled appt per 1/09 los - sent reminder letter in the mail . F/u Mid March .

## 2017-06-24 NOTE — Progress Notes (Signed)
Spoke to pt concerning [facility] from 1.9.19. Denies questions or concerns regarding dx or treatment care plan. Encourage pt to call with needs. Received verbal understanding. Contact information provided. Confirmed appt with Dr. Lucia Gaskins -ENT today.

## 2017-06-25 ENCOUNTER — Encounter (HOSPITAL_BASED_OUTPATIENT_CLINIC_OR_DEPARTMENT_OTHER): Payer: Self-pay | Admitting: *Deleted

## 2017-06-25 ENCOUNTER — Other Ambulatory Visit: Payer: Self-pay

## 2017-06-25 ENCOUNTER — Other Ambulatory Visit: Payer: Self-pay | Admitting: General Surgery

## 2017-06-25 DIAGNOSIS — Z170 Estrogen receptor positive status [ER+]: Principal | ICD-10-CM

## 2017-06-25 DIAGNOSIS — C50312 Malignant neoplasm of lower-inner quadrant of left female breast: Secondary | ICD-10-CM

## 2017-06-25 NOTE — Pre-Procedure Instructions (Signed)
Patient given pre surgery Ensure and instructed to drink by 0615 DOS.  Also given CHG wash for night prior and morning of surgery. She voiced understanding

## 2017-06-29 ENCOUNTER — Ambulatory Visit (HOSPITAL_BASED_OUTPATIENT_CLINIC_OR_DEPARTMENT_OTHER)
Admission: RE | Admit: 2017-06-29 | Discharge: 2017-06-29 | Disposition: A | Discharge status: Home / Self Care | Payer: 59 | Source: Ambulatory Visit | Attending: General Surgery | Admitting: General Surgery

## 2017-06-29 ENCOUNTER — Encounter (HOSPITAL_BASED_OUTPATIENT_CLINIC_OR_DEPARTMENT_OTHER): Payer: Self-pay

## 2017-06-29 ENCOUNTER — Ambulatory Visit
Admission: RE | Admit: 2017-06-29 | Discharge: 2017-06-29 | Disposition: A | Discharge status: Home / Self Care | Payer: 59 | Source: Ambulatory Visit | Attending: General Surgery | Admitting: General Surgery

## 2017-06-29 ENCOUNTER — Other Ambulatory Visit: Payer: Self-pay

## 2017-06-29 ENCOUNTER — Encounter (HOSPITAL_COMMUNITY)
Admission: RE | Admit: 2017-06-29 | Discharge: 2017-06-29 | Disposition: A | Discharge status: Home / Self Care | Payer: 59 | Source: Ambulatory Visit | Attending: General Surgery | Admitting: General Surgery

## 2017-06-29 ENCOUNTER — Ambulatory Visit (HOSPITAL_BASED_OUTPATIENT_CLINIC_OR_DEPARTMENT_OTHER): Payer: 59 | Admitting: Anesthesiology

## 2017-06-29 ENCOUNTER — Encounter (HOSPITAL_BASED_OUTPATIENT_CLINIC_OR_DEPARTMENT_OTHER)
Admission: RE | Disposition: A | Discharge status: Home / Self Care | Payer: Self-pay | Source: Ambulatory Visit | Attending: General Surgery

## 2017-06-29 ENCOUNTER — Other Ambulatory Visit: Payer: Self-pay | Admitting: *Deleted

## 2017-06-29 DIAGNOSIS — Z170 Estrogen receptor positive status [ER+]: Secondary | ICD-10-CM | POA: Insufficient documentation

## 2017-06-29 DIAGNOSIS — C50312 Malignant neoplasm of lower-inner quadrant of left female breast: Secondary | ICD-10-CM | POA: Insufficient documentation

## 2017-06-29 DIAGNOSIS — Z6832 Body mass index (BMI) 32.0-32.9, adult: Secondary | ICD-10-CM | POA: Diagnosis not present

## 2017-06-29 DIAGNOSIS — J45909 Unspecified asthma, uncomplicated: Secondary | ICD-10-CM | POA: Diagnosis not present

## 2017-06-29 DIAGNOSIS — E669 Obesity, unspecified: Secondary | ICD-10-CM | POA: Insufficient documentation

## 2017-06-29 DIAGNOSIS — Z87891 Personal history of nicotine dependence: Secondary | ICD-10-CM | POA: Diagnosis not present

## 2017-06-29 DIAGNOSIS — N6012 Diffuse cystic mastopathy of left breast: Secondary | ICD-10-CM | POA: Diagnosis not present

## 2017-06-29 DIAGNOSIS — Z79899 Other long term (current) drug therapy: Secondary | ICD-10-CM | POA: Insufficient documentation

## 2017-06-29 DIAGNOSIS — D0512 Intraductal carcinoma in situ of left breast: Secondary | ICD-10-CM

## 2017-06-29 HISTORY — PX: BREAST LUMPECTOMY WITH RADIOACTIVE SEED AND SENTINEL LYMPH NODE BIOPSY: SHX6550

## 2017-06-29 SURGERY — BREAST LUMPECTOMY WITH RADIOACTIVE SEED AND SENTINEL LYMPH NODE BIOPSY
Anesthesia: Regional | Site: Breast | Laterality: Left

## 2017-06-29 MED ORDER — ACETAMINOPHEN 500 MG PO TABS
ORAL_TABLET | ORAL | Status: AC
  Filled 2017-06-29: qty 2

## 2017-06-29 MED ORDER — KETOROLAC TROMETHAMINE 15 MG/ML IJ SOLN
INTRAMUSCULAR | Status: AC
  Filled 2017-06-29: qty 1

## 2017-06-29 MED ORDER — MIDAZOLAM HCL 2 MG/2ML IJ SOLN
INTRAMUSCULAR | Status: AC
  Filled 2017-06-29: qty 2

## 2017-06-29 MED ORDER — DEXAMETHASONE SODIUM PHOSPHATE 10 MG/ML IJ SOLN
INTRAMUSCULAR | Status: AC
  Filled 2017-06-29: qty 1

## 2017-06-29 MED ORDER — CEFAZOLIN SODIUM-DEXTROSE 2-4 GM/100ML-% IV SOLN: 2.0000 g | INTRAVENOUS | Status: DC

## 2017-06-29 MED ORDER — FENTANYL CITRATE (PF) 100 MCG/2ML IJ SOLN: 25.0000 ug | INTRAMUSCULAR | Status: DC | PRN

## 2017-06-29 MED ORDER — OXYCODONE HCL 5 MG PO TABS: 5.0000 mg | ORAL_TABLET | Freq: Four times a day (QID) | ORAL | 0 refills | Status: DC | PRN

## 2017-06-29 MED ORDER — GABAPENTIN 300 MG PO CAPS
ORAL_CAPSULE | ORAL | Status: AC
  Filled 2017-06-29: qty 1

## 2017-06-29 MED ORDER — PROPOFOL 10 MG/ML IV BOLUS
INTRAVENOUS | Status: DC | PRN
  Administered 2017-06-29: 200 mg via INTRAVENOUS

## 2017-06-29 MED ORDER — ONDANSETRON HCL 4 MG/2ML IJ SOLN: 4.0000 mg | Freq: Once | INTRAMUSCULAR | Status: DC | PRN

## 2017-06-29 MED ORDER — FENTANYL CITRATE (PF) 100 MCG/2ML IJ SOLN
INTRAMUSCULAR | Status: AC
  Filled 2017-06-29: qty 2

## 2017-06-29 MED ORDER — TECHNETIUM TC 99M SULFUR COLLOID FILTERED
1.0000 | Freq: Once | INTRAVENOUS | Status: AC | PRN
  Administered 2017-06-29: 1 via INTRADERMAL

## 2017-06-29 MED ORDER — GABAPENTIN 300 MG PO CAPS
300.0000 mg | ORAL_CAPSULE | ORAL | Status: AC
  Administered 2017-06-29: 300 mg via ORAL

## 2017-06-29 MED ORDER — PROPOFOL 10 MG/ML IV BOLUS
INTRAVENOUS | Status: AC
  Filled 2017-06-29: qty 40

## 2017-06-29 MED ORDER — MIDAZOLAM HCL 2 MG/2ML IJ SOLN
1.0000 mg | INTRAMUSCULAR | Status: DC | PRN
  Administered 2017-06-29 (×2): 2 mg via INTRAVENOUS

## 2017-06-29 MED ORDER — LIDOCAINE 2% (20 MG/ML) 5 ML SYRINGE
INTRAMUSCULAR | Status: AC
  Filled 2017-06-29: qty 5

## 2017-06-29 MED ORDER — BUPIVACAINE HCL (PF) 0.25 % IJ SOLN
INTRAMUSCULAR | Status: DC | PRN
  Administered 2017-06-29: 4 mL

## 2017-06-29 MED ORDER — PHENYLEPHRINE HCL 10 MG/ML IJ SOLN
INTRAMUSCULAR | Status: DC | PRN
  Administered 2017-06-29: 120 ug via INTRAVENOUS

## 2017-06-29 MED ORDER — ACETAMINOPHEN 500 MG PO TABS
1000.0000 mg | ORAL_TABLET | ORAL | Status: AC
  Administered 2017-06-29: 1000 mg via ORAL

## 2017-06-29 MED ORDER — ONDANSETRON HCL 4 MG/2ML IJ SOLN
INTRAMUSCULAR | Status: AC
  Filled 2017-06-29: qty 2

## 2017-06-29 MED ORDER — SCOPOLAMINE 1 MG/3DAYS TD PT72: 1.0000 | MEDICATED_PATCH | Freq: Once | TRANSDERMAL | Status: DC | PRN

## 2017-06-29 MED ORDER — CEFAZOLIN SODIUM-DEXTROSE 2-4 GM/100ML-% IV SOLN
INTRAVENOUS | Status: AC
  Filled 2017-06-29: qty 100

## 2017-06-29 MED ORDER — DEXAMETHASONE SODIUM PHOSPHATE 10 MG/ML IJ SOLN
INTRAMUSCULAR | Status: DC | PRN
  Administered 2017-06-29: 10 mg via INTRAVENOUS

## 2017-06-29 MED ORDER — FENTANYL CITRATE (PF) 100 MCG/2ML IJ SOLN
50.0000 ug | INTRAMUSCULAR | Status: AC | PRN
  Administered 2017-06-29: 25 ug via INTRAVENOUS
  Administered 2017-06-29 (×2): 50 ug via INTRAVENOUS

## 2017-06-29 MED ORDER — ONDANSETRON HCL 4 MG/2ML IJ SOLN
INTRAMUSCULAR | Status: DC | PRN
  Administered 2017-06-29: 4 mg via INTRAVENOUS

## 2017-06-29 MED ORDER — ENSURE PRE-SURGERY PO LIQD: 592.0000 mL | Freq: Once | ORAL | Status: DC

## 2017-06-29 MED ORDER — CEFAZOLIN SODIUM-DEXTROSE 2-3 GM-%(50ML) IV SOLR
INTRAVENOUS | Status: DC | PRN
  Administered 2017-06-29: 2 g via INTRAVENOUS

## 2017-06-29 MED ORDER — LIDOCAINE HCL (CARDIAC) 20 MG/ML IV SOLN
INTRAVENOUS | Status: DC | PRN
  Administered 2017-06-29: 100 mg via INTRAVENOUS

## 2017-06-29 MED ORDER — ROPIVACAINE HCL 7.5 MG/ML IJ SOLN
INTRAMUSCULAR | Status: DC | PRN
  Administered 2017-06-29: 20 mL via PERINEURAL

## 2017-06-29 MED ORDER — LACTATED RINGERS IV SOLN
INTRAVENOUS | Status: DC
  Administered 2017-06-29 (×2): via INTRAVENOUS

## 2017-06-29 MED ORDER — KETOROLAC TROMETHAMINE 15 MG/ML IJ SOLN
15.0000 mg | INTRAMUSCULAR | Status: DC
  Administered 2017-06-29: 15 mg via INTRAVENOUS

## 2017-06-29 SURGICAL SUPPLY — 48 items
ADH SKN CLS APL DERMABOND .7 (GAUZE/BANDAGES/DRESSINGS) ×1
APPLIER CLIP 9.375 MED OPEN (MISCELLANEOUS) ×3
APR CLP MED 9.3 20 MLT OPN (MISCELLANEOUS) ×1
BINDER BREAST XLRG (GAUZE/BANDAGES/DRESSINGS) ×2 IMPLANT
BLADE SURG 15 STRL LF DISP TIS (BLADE) ×1 IMPLANT
BLADE SURG 15 STRL SS (BLADE) ×3
CANISTER SUCT 1200ML W/VALVE (MISCELLANEOUS) ×2 IMPLANT
CHLORAPREP W/TINT 26ML (MISCELLANEOUS) ×3 IMPLANT
CLIP APPLIE 9.375 MED OPEN (MISCELLANEOUS) IMPLANT
CLIP VESOCCLUDE SM WIDE 6/CT (CLIP) ×3 IMPLANT
CLOSURE WOUND 1/2 X4 (GAUZE/BANDAGES/DRESSINGS) ×1
COVER BACK TABLE 60X90IN (DRAPES) ×3 IMPLANT
COVER MAYO STAND STRL (DRAPES) ×3 IMPLANT
COVER PROBE W GEL 5X96 (DRAPES) ×3 IMPLANT
DERMABOND ADVANCED (GAUZE/BANDAGES/DRESSINGS) ×2
DERMABOND ADVANCED .7 DNX12 (GAUZE/BANDAGES/DRESSINGS) ×1 IMPLANT
DEVICE DUBIN W/COMP PLATE 8390 (MISCELLANEOUS) ×3 IMPLANT
DRAPE LAPAROSCOPIC ABDOMINAL (DRAPES) ×3 IMPLANT
DRAPE UTILITY XL STRL (DRAPES) ×3 IMPLANT
ELECT COATED BLADE 2.86 ST (ELECTRODE) ×3 IMPLANT
ELECT REM PT RETURN 9FT ADLT (ELECTROSURGICAL) ×3
ELECTRODE REM PT RTRN 9FT ADLT (ELECTROSURGICAL) ×1 IMPLANT
GLOVE BIO SURGEON STRL SZ7 (GLOVE) ×6 IMPLANT
GLOVE BIOGEL PI IND STRL 7.5 (GLOVE) ×1 IMPLANT
GLOVE BIOGEL PI INDICATOR 7.5 (GLOVE) ×2
GOWN STRL REUS W/ TWL LRG LVL3 (GOWN DISPOSABLE) ×2 IMPLANT
GOWN STRL REUS W/TWL LRG LVL3 (GOWN DISPOSABLE) ×6
KIT MARKER MARGIN INK (KITS) ×3 IMPLANT
NDL HYPO 25X1 1.5 SAFETY (NEEDLE) ×1 IMPLANT
NEEDLE HYPO 25X1 1.5 SAFETY (NEEDLE) ×3 IMPLANT
NS IRRIG 1000ML POUR BTL (IV SOLUTION) ×3 IMPLANT
PACK BASIN DAY SURGERY FS (CUSTOM PROCEDURE TRAY) ×3 IMPLANT
PENCIL BUTTON HOLSTER BLD 10FT (ELECTRODE) ×3 IMPLANT
SLEEVE SCD COMPRESS KNEE MED (MISCELLANEOUS) ×3 IMPLANT
SPONGE LAP 4X18 X RAY DECT (DISPOSABLE) ×3 IMPLANT
STRIP CLOSURE SKIN 1/2X4 (GAUZE/BANDAGES/DRESSINGS) ×2 IMPLANT
SUT MNCRL AB 4-0 PS2 18 (SUTURE) ×3 IMPLANT
SUT MON AB 5-0 PS2 18 (SUTURE) ×2 IMPLANT
SUT VIC AB 2-0 SH 27 (SUTURE) ×6
SUT VIC AB 2-0 SH 27XBRD (SUTURE) ×1 IMPLANT
SUT VIC AB 3-0 SH 27 (SUTURE) ×6
SUT VIC AB 3-0 SH 27X BRD (SUTURE) ×1 IMPLANT
SYR CONTROL 10ML LL (SYRINGE) ×3 IMPLANT
TOWEL OR 17X24 6PK STRL BLUE (TOWEL DISPOSABLE) ×3 IMPLANT
TOWEL OR NON WOVEN STRL DISP B (DISPOSABLE) ×3 IMPLANT
TUBE CONNECTING 20'X1/4 (TUBING) ×1
TUBE CONNECTING 20X1/4 (TUBING) ×1 IMPLANT
YANKAUER SUCT BULB TIP NO VENT (SUCTIONS) ×2 IMPLANT

## 2017-06-29 NOTE — H&P (Signed)
39 yof referred by Katie Robinson for left breast cancer. she has no family history, she has no prior breast history. she had no mass or dc. she underwent screening mm that shows left breast calcifications. in the lower inner quadrant of the left breast there is a 7x7x7 mm area of left breast calcs. the axillary [place] is negative. the core biopsy with clip placement shows hg dcis with calcs that is er positive, pr negative and there are foci suspicious for invasion. she is here with family to discuss options today she has history of asthma and uses inhaler nearly daily she has [place] treated for past year with humira and followed by Dr Carlean Purl. she is undergoing evaluation for back pain right now and recently had an mri.   Past Surgical History  Foot Surgery - Left  Hysterectomy; Total  Tonsillectomy  Ventral / Umbilical Hernia Surgery   Allergies Rolm Bookbinder, MD; 06/23/2017 1:58 PM) No Known Drug Allergies [06/23/2017]:  Medication History Rolm Bookbinder, MD; 06/23/2017 1:58 PM) Medications Reconciled Humira (40MG/0.8ML Prefill Syr Kit, Subcutaneous) Active. Ventolin HFA (108 (90 Base)MCG/ACT Aerosol Soln, Inhalation) Active. Probiotic Product (Oral) Active. Vitamin D (1000UNIT Tablet, Oral) Active.  Social History Rolm Bookbinder, MD; 06/23/2017 1:59 PM) No alcohol use  Current tobacco use  Never smoker.  Family History Rolm Bookbinder, MD; 06/23/2017 2:00 PM) Breast Cancer  Negative Family History Of. Ovarian Cancer  Negative Family History Of.    Review of Systems Rolm Bookbinder MD; 06/23/2017 2:01 PM) Respiratory Present- Wheezing. Breast Present- Breast Mass. Musculoskeletal Present- Back Pain. All other systems negative   Physical Exam Rolm Bookbinder MD; 06/23/2017 1:53 PM) General Mental Status-Alert. Orientation-Oriented X3.  Head and Neck Trachea-midline. Thyroid Gland Characteristics - normal size and  consistency.  Eye Sclera/Conjunctiva - Bilateral-No scleral icterus.  Chest and Lung Exam Chest and lung exam reveals -quiet, even and easy respiratory effort with no use of accessory muscles and on auscultation, normal breath sounds, no adventitious sounds and normal vocal resonance.  Breast Nipples-No Discharge. Breast Lump-No Palpable Breast Mass.  Cardiovascular Cardiovascular examination reveals -normal heart sounds, regular rate and rhythm with no murmurs.  Abdomen Note: soft nt no hepatomegaly   Lymphatic Head & Neck  General Head & Neck Lymphatics: Bilateral - Description - Normal. Axillary  General Axillary Region: Bilateral - Description - Normal. Note: no [place] adenopathy     Assessment & Plan Rolm Bookbinder MD; 06/23/2017 2:17 PM) BREAST CANCER OF LOWER-INNER QUADRANT OF LEFT FEMALE BREAST (C50.312) Story: Left breast seed guided lumpectomy, left axillary sentinel node biopsy We discussed the staging and pathophysiology of breast cancer. We discussed all of the different options for treatment for breast cancer including surgery, chemotherapy, radiation therapy, Herceptin, and antiestrogen therapy. We discussed a sentinel lymph node biopsy as she does not appear to having lymph node involvement right now. We discussed the performance of that with injection of radioactive tracer. We discussed that there is a chance of having a positive node with a sentinel lymph node biopsy and we will await the permanent pathology to make any other first further decisions in terms of her treatment. We discussed up to a 5% risk lifetime of chronic shoulder pain as well as lymphedema associated with a sentinel lymph node biopsy. We discussed the options for treatment of the breast cancer which included lumpectomy versus a mastectomy. We discussed the performance of the lumpectomy with radioactive seed placement. We discussed a 5-10% chance of a positive margin requiring  reexcision in the  operating room. We also discussed that she will need radiation therapy if she undergoes lumpectomy. The breast cannot undergo more radiation therapy in the same breast after lumpectomy in the future. We discussed mastectomy and the postoperative care for that as well. Mastectomy can be followed by reconstruction. This is a more extensive surgery and requires more recovery. The decision for lumpectomy vs mastectomy has no impact on decision for chemotherapy. Most mastectomy patients will not need radiation therapy. We discussed that there is no difference in her survival whether she undergoes lumpectomy with radiation therapy or antiestrogen therapy versus a mastectomy. There is also no real difference between her recurrence in the breast. We discussed the risks of operation including bleeding, infection, possible reoperation. further therapy based on operation

## 2017-06-29 NOTE — Interval H&P Note (Signed)
History and Physical Interval Note:  06/29/2017 10:32 AM  Katie Robinson  has presented today for surgery, with the diagnosis of left breast cancer  The various methods of treatment have been discussed with the patient and family. After consideration of risks, benefits and other options for treatment, the patient has consented to  Procedure(s) with comments: LEFT BREAST LUMPECTOMY WITH RADIOACTIVE SEED AND LEFT AXILLARY SENTINEL [place] (Left) - TEC BLOCK as a surgical intervention .  The patient's history has been reviewed, patient examined, no change in status, stable for surgery.  I have reviewed the patient's chart and labs.  Questions were answered to the patient's satisfaction.     Rolm Bookbinder

## 2017-06-29 NOTE — Progress Notes (Signed)
Assisted Dr. Roanna Banning with left, ultrasound guided, pectoralis block. Side rails up, monitors on throughout procedure. See vital signs in flow sheet. Tolerated Procedure well.

## 2017-06-29 NOTE — Anesthesia Procedure Notes (Signed)
Procedure Name: LMA Insertion Performed by: Verita Lamb, CRNA Pre-anesthesia Checklist: Patient identified, Emergency Drugs available, Suction available, Patient being monitored and Timeout performed Preoxygenation: Pre-oxygenation with 100% oxygen Induction Type: IV induction LMA: LMA inserted LMA Size: 5.0 Number of attempts: 1 Placement Confirmation: positive ETCO2,  CO2 detector and breath sounds checked- equal and bilateral Tube secured with: Tape Dental Injury: Teeth and Oropharynx as per pre-operative assessment  Comments: lma 4 positioned but seemed to be folded up, attempted again to seat lma.  Anesthesiologist used a mac 3 blade to get tongue out of the way and inserted a 5 lma.  Seated well with this technique

## 2017-06-29 NOTE — Anesthesia Postprocedure Evaluation (Signed)
Anesthesia Post Note  Patient: KALICIA DUFRESNE  Procedure(s) Performed: LEFT BREAST LUMPECTOMY WITH RADIOACTIVE SEED AND LEFT AXILLARY SENTINEL LYMPH NODE BIOPSY ERAS PATHWAY (Left Breast)     Patient location during evaluation: PACU Anesthesia Type: Regional and General Level of consciousness: awake and alert Pain management: pain level controlled Vital Signs Assessment: post-procedure vital signs reviewed and stable Respiratory status: spontaneous breathing, nonlabored ventilation, respiratory function stable and patient connected to nasal cannula oxygen Cardiovascular status: blood pressure returned to baseline and stable Postop Assessment: no apparent nausea or vomiting Anesthetic complications: no    Last Vitals:  Vitals:   06/29/17 1230 06/29/17 1246  BP: (!) 155/108 (!) 156/87  Pulse: 72 80  Resp: (!) 21 16  Temp:  36.7 C  SpO2: 99% 100%    Last Pain:  Vitals:   06/29/17 1330  TempSrc:   PainSc: 0-No pain                 Ryan P Ellender

## 2017-06-29 NOTE — Anesthesia Preprocedure Evaluation (Addendum)
Anesthesia Evaluation  Patient identified by MRN, date of birth, ID band Patient awake    Reviewed: Allergy & Precautions, NPO status , Patient's Chart, lab work & pertinent test results  Airway Mallampati: II  TM Distance: >3 FB Neck ROM: Full    Dental no notable dental hx.    Pulmonary asthma , former smoker,    Pulmonary exam normal breath sounds clear to auscultation       Cardiovascular negative cardio ROS Normal cardiovascular exam Rhythm:Regular Rate:Normal     Neuro/Psych  Headaches, negative psych ROS   GI/Hepatic Neg liver ROS, UC   Endo/Other  negative endocrine ROS  Renal/GU negative Renal ROS     Musculoskeletal negative musculoskeletal ROS (+)   Abdominal (+) + obese,   Peds  Hematology negative hematology ROS (+)   Anesthesia Other Findings left breast cancer  Reproductive/Obstetrics                            Anesthesia Physical Anesthesia Plan  ASA: II  Anesthesia Plan: General and Regional   Post-op Pain Management: GA combined w/ Regional for post-op pain   Induction: Intravenous  PONV Risk Score and Plan: 3 and Dexamethasone, Ondansetron, Midazolam and Treatment may vary due to age or medical condition  Airway Management Planned: LMA  Additional Equipment:   Intra-op Plan:   Post-operative Plan: Extubation in OR  Informed Consent: I have reviewed the patients History and Physical, chart, labs and discussed the procedure including the risks, benefits and alternatives for the proposed anesthesia with the patient or authorized representative who has indicated his/her understanding and acceptance.   Dental advisory given  Plan Discussed with: CRNA  Anesthesia Plan Comments:         Anesthesia Quick Evaluation

## 2017-06-29 NOTE — Transfer of Care (Signed)
Immediate Anesthesia Transfer of Care Note  Patient: Katie Robinson  Procedure(s) Performed: LEFT BREAST LUMPECTOMY WITH RADIOACTIVE SEED AND LEFT AXILLARY SENTINEL LYMPH NODE BIOPSY ERAS PATHWAY (Left Breast)  Patient Location: PACU  Anesthesia Type:General  Level of Consciousness: awake, alert  and oriented  Airway & Oxygen Therapy: Patient Spontanous Breathing and Patient connected to face mask oxygen  Post-op Assessment: Report given to RN and Post -op Vital signs reviewed and stable  Post vital signs: Reviewed and stable  Last Vitals:  Vitals:   06/29/17 1019 06/29/17 1149  BP:  (!) 155/90  Pulse: 71 (P) 73  Resp: 13 (P) 11  Temp:    SpO2: 97% 100%    Last Pain:  Vitals:   06/29/17 0846  TempSrc: Oral         Complications: No apparent anesthesia complications

## 2017-06-29 NOTE — Anesthesia Procedure Notes (Signed)
Anesthesia Regional Block: Pectoralis block   Pre-Anesthetic Checklist: ,, timeout performed, Correct Patient, Correct Site, Correct Laterality, Correct Procedure,, site marked, risks and benefits discussed, Surgical consent,  Pre-op evaluation,  At surgeon's request and post-op pain management  Laterality: Left  Prep: chloraprep       Needles:  Injection technique: Single-shot  Needle Type: Echogenic Stimulator Needle     Needle Length: 9cm  Needle Gauge: 21     Additional Needles:   Procedures:,,,, ultrasound used (permanent image in chart),,,,  Narrative:  Start time: 06/29/2017 9:25 AM End time: 06/29/2017 9:35 AM Injection made incrementally with aspirations every 5 mL.  Performed by: Personally  Anesthesiologist: Murvin Natal, MD  Additional Notes: Functioning IV was confirmed and monitors were applied.  A 91m 21ga Arrow echogenic stimulator needle was used. Sterile prep, hand hygiene and sterile gloves were used.  Negative aspiration and negative test dose prior to incremental administration of local anesthetic. The patient tolerated the procedure well.

## 2017-06-29 NOTE — Op Note (Signed)
Preoperative diagnosis: Left breast cancer, clinical stage 0 Postoperative diagnosis: same as above Procedure:Leftbreast seed guided lumpectomy Leftdeep axillary sentinel node biopsy Surgeon: Dr Serita Grammes HER:[id number] Anes: general  Specimens  1.left breast tissue marked with paint 2.leftaxillary sentinel nodes with highest [id number] Complications none Drains none Sponge count correct Dispo to pacu stable  Indications: This is a61 yof with left breast calcs.  This underwent core biopsy and shows her to have dcis with concern for invasion. We discussed options and have elected to proceed with lumpectomy and sentinel node biopsy. She had radioactive seed placed prior to beginning and the mammograms were available for review.   Procedure: After informed consent was obtained the patient was taken to the operating room. She first was given technetium in standard periareolar fashion. She had a pectoral block. She was given antibiotics. Sequential compression devices were on her legs. She was then placed under general anesthesia with an LMA. Then she was prepped and draped in the standard sterile surgical fashion. Surgical timeout was then performed. I then located the seed in thelower inner left breastI infiltrated marcaine in the skin and then madea periareolar incisionto attempt to hide the scar. I then used the neoprobe to remove the seed and the surrounding tissue with attempt to get clear margins. I marked this with paint. MM confirmed removal of seed and theclip.I placed clips in the cavity.I then obtained hemostasis. This was marked as above.I closed with with 2-0 vicryl to approximate breast tissue. The skin was closed with 3-0 vicryl and 4-0 monocryl glue and steristrips were placed.  I then made a low axillary incision after locating the sentinel node. Icarried this through the axillary fascia.there was radioactivity present that was easily noted. I removed  the radioactive nodes.The background radioactivity was minimal.I then obtained hemostasis. I closed the axillary fascia with 2-0 vicryl.The skin was closed with 3-0 vicryl and 4-0 monocryl. Glue and steristrips were applied. A binder was placed.  She tolerated this well and was transferred to the recovery room in stable condition

## 2017-06-29 NOTE — Progress Notes (Signed)
Emotional support during breast injections °

## 2017-06-29 NOTE — Discharge Instructions (Signed)
[facility] Office Phone Number [phone]   POST OP INSTRUCTIONS  Always review your discharge instruction sheet given to you by the facility where your surgery was performed.  IF YOU HAVE DISABILITY OR FAMILY LEAVE FORMS, YOU MUST BRING THEM TO THE OFFICE FOR PROCESSING.  DO NOT GIVE THEM TO YOUR DOCTOR.  1. A prescription for pain medication may be given to you upon discharge.  Take your pain medication as prescribed, if needed.  If narcotic pain medicine is not needed, then you may take acetaminophen (Tylenol), naprosyn (Alleve) or ibuprofen (Advil) as needed. 2. Take your usually prescribed medications unless otherwise directed 3. If you need a refill on your pain medication, please contact your pharmacy.  They will contact our office to request authorization.  Prescriptions will not be filled after 5pm or on week-ends. 4. You should eat very light the first 24 hours after surgery, such as soup, crackers, pudding, etc.  Resume your normal diet the day after surgery. 5. Most patients will experience some swelling and bruising in the breast.  Ice packs and a good support bra will help.  Wear the breast binder provided or a sports bra for 72 hours day and night.  After that wear a sports bra during the day until you return to the office. Swelling and bruising can take several days to resolve.  6. It is common to experience some constipation if taking pain medication after surgery.  Increasing fluid intake and taking a stool softener will usually help or prevent this problem from occurring.  A mild laxative (Milk of Magnesia or Miralax) should be taken according to package directions if there are no bowel movements after 48 hours. 7. Unless discharge instructions indicate otherwise, you may remove your bandages 48 hours after surgery and you may shower at that time.  You may have steri-strips (small skin tapes) in place directly over the incision.  These strips should be left on the  skin for 7-10 days and will come off on their own.  If your surgeon used skin glue on the incision, you may shower in 24 hours.  The glue will flake off over the next 2-3 weeks.  Any sutures or staples will be removed at the office during your follow-up visit. 8. ACTIVITIES:  You may resume regular daily activities (gradually increasing) beginning the next day.  Wearing a good support bra or sports bra minimizes pain and swelling.  You may have sexual intercourse when it is comfortable. a. You may drive when you no longer are taking prescription pain medication, you can comfortably wear a seatbelt, and you can safely maneuver your car and apply brakes. b. RETURN TO WORK:  ______________________________________________________________________________________ 9. You should see your doctor in the office for a follow-up appointment approximately two weeks after your surgery.  Your doctors nurse will typically make your follow-up appointment when she calls you with your pathology report.  Expect your pathology report 3-4 business days after your surgery.  You may call to check if you do not hear from [place] after three days. OTHER INSTRUCTIONS: ______WHEN TO CALL DR [place]: 1. Fever over 101.0 2. Nausea and/or vomiting. 3. Extreme swelling or bruising. 4. Continued bleeding from incision. 5. Increased pain, redness, or drainage from the incision.  The clinic staff is available to answer your questions during regular business hours.  Please dont hesitate to call and ask to speak to one of the nurses for clinical concerns.  If you have a medical emergency, go  to the nearest emergency room or call 911.  A surgeon from [facility] is always on call at the hospital.  For further questions, please visit [email or web address]   Post Anesthesia Home Care Instructions  Activity: Get plenty of rest for the remainder of the day. A responsible individual must stay with you for 24 hours  following the procedure.  For the next 24 hours, DO NOT: -Drive a car -Paediatric nurse -Drink alcoholic beverages -Take any medication unless instructed by your physician -Make any legal decisions or sign important papers.  Meals: Start with liquid foods such as gelatin or soup. Progress to regular foods as tolerated. Avoid greasy, spicy, heavy foods. If nausea and/or vomiting occur, drink only clear liquids until the nausea and/or vomiting subsides. Call your physician if vomiting continues.  Special Instructions/Symptoms: Your throat may feel dry or sore from the anesthesia or the breathing tube placed in your throat during surgery. If this causes discomfort, gargle with warm salt water. The discomfort should disappear within 24 hours.  If you had a scopolamine patch placed behind your ear for the management of post- operative nausea and/or vomiting:  1. The medication in the patch is effective for 72 hours, after which it should be removed.  Wrap patch in a tissue and discard in the trash. Wash hands thoroughly with soap and water. 2. You may remove the patch earlier than 72 hours if you experience unpleasant side effects which may include dry mouth, dizziness or visual disturbances. 3. Avoid touching the patch. Wash your hands with soap and water after contact with the patch.

## 2017-06-30 ENCOUNTER — Encounter: Payer: Self-pay | Admitting: Oncology

## 2017-06-30 ENCOUNTER — Other Ambulatory Visit: Payer: Self-pay | Admitting: Oncology

## 2017-06-30 ENCOUNTER — Encounter (HOSPITAL_BASED_OUTPATIENT_CLINIC_OR_DEPARTMENT_OTHER): Payer: Self-pay | Admitting: General Surgery

## 2017-07-01 ENCOUNTER — Encounter: Payer: Self-pay | Admitting: *Deleted

## 2017-07-01 ENCOUNTER — Encounter: Payer: Self-pay | Admitting: Radiation Oncology

## 2017-07-08 ENCOUNTER — Telehealth: Payer: Self-pay

## 2017-07-08 ENCOUNTER — Encounter: Payer: Self-pay | Admitting: Internal Medicine

## 2017-07-08 ENCOUNTER — Other Ambulatory Visit: Payer: 59

## 2017-07-08 ENCOUNTER — Other Ambulatory Visit: Payer: Self-pay | Admitting: *Deleted

## 2017-07-08 DIAGNOSIS — K5190 Ulcerative colitis, unspecified, without complications: Secondary | ICD-10-CM

## 2017-07-08 NOTE — Telephone Encounter (Signed)
Spoke with Katie Robinson and she will come at 9:15AM instead of 8:30AM on 07/12/17 due to a schedule change we've had. Front desk informed of the change.

## 2017-07-10 LAB — QUANTIFERON-TB GOLD PLUS
Mitogen-NIL: >10 IU/mL
NIL: 0.06 IU/mL
QUANTIFERON-TB GOLD PLUS: NEGATIVE
TB1-NIL: 0 IU/mL
TB2-NIL: 0.02 [IU]/mL

## 2017-07-11 ENCOUNTER — Observation Stay (HOSPITAL_COMMUNITY)
Admission: EM | Admit: 2017-07-11 | Discharge: 2017-07-12 | Disposition: A | Discharge status: Home / Self Care | Payer: POS | Attending: General Surgery | Admitting: General Surgery

## 2017-07-11 ENCOUNTER — Encounter (HOSPITAL_COMMUNITY)
Admission: EM | Disposition: A | Discharge status: Home / Self Care | Payer: Self-pay | Source: Home / Self Care | Attending: Emergency Medicine

## 2017-07-11 ENCOUNTER — Encounter (HOSPITAL_COMMUNITY): Payer: Self-pay | Admitting: Certified Registered Nurse Anesthetist

## 2017-07-11 ENCOUNTER — Emergency Department (HOSPITAL_COMMUNITY): Payer: POS | Admitting: Certified Registered Nurse Anesthetist

## 2017-07-11 ENCOUNTER — Other Ambulatory Visit: Payer: Self-pay

## 2017-07-11 DIAGNOSIS — Z6832 Body mass index (BMI) 32.0-32.9, adult: Secondary | ICD-10-CM | POA: Insufficient documentation

## 2017-07-11 DIAGNOSIS — K219 Gastro-esophageal reflux disease without esophagitis: Secondary | ICD-10-CM | POA: Insufficient documentation

## 2017-07-11 DIAGNOSIS — Z87891 Personal history of nicotine dependence: Secondary | ICD-10-CM | POA: Diagnosis not present

## 2017-07-11 DIAGNOSIS — Z79899 Other long term (current) drug therapy: Secondary | ICD-10-CM | POA: Diagnosis not present

## 2017-07-11 DIAGNOSIS — N6489 Other specified disorders of breast: Secondary | ICD-10-CM | POA: Insufficient documentation

## 2017-07-11 DIAGNOSIS — Z8249 Family history of ischemic heart disease and other diseases of the circulatory system: Secondary | ICD-10-CM | POA: Insufficient documentation

## 2017-07-11 DIAGNOSIS — M2550 Pain in unspecified joint: Secondary | ICD-10-CM | POA: Insufficient documentation

## 2017-07-11 DIAGNOSIS — D0512 Intraductal carcinoma in situ of left breast: Secondary | ICD-10-CM | POA: Insufficient documentation

## 2017-07-11 DIAGNOSIS — Z8379 Family history of other diseases of the digestive system: Secondary | ICD-10-CM | POA: Insufficient documentation

## 2017-07-11 DIAGNOSIS — N99840 Postprocedural hematoma of a genitourinary system organ or structure following a genitourinary system procedure: Secondary | ICD-10-CM | POA: Diagnosis not present

## 2017-07-11 DIAGNOSIS — E669 Obesity, unspecified: Secondary | ICD-10-CM | POA: Diagnosis not present

## 2017-07-11 DIAGNOSIS — E559 Vitamin D deficiency, unspecified: Secondary | ICD-10-CM | POA: Diagnosis not present

## 2017-07-11 DIAGNOSIS — N644 Mastodynia: Secondary | ICD-10-CM | POA: Diagnosis present

## 2017-07-11 HISTORY — PX: EVACUATION BREAST HEMATOMA: SHX1537

## 2017-07-11 LAB — TYPE AND SCREEN
ABO/RH(D): B POS
Antibody Screen: NEGATIVE

## 2017-07-11 LAB — CBC WITH DIFFERENTIAL/PLATELET
BASOS ABS: 0 10*3/uL (ref 0.0–0.1)
Basophils Relative: 0 %
EOS PCT: 2 %
Eosinophils Absolute: 0.3 10*3/uL (ref 0.0–0.7)
HCT: 40.8 % (ref 36.0–46.0)
HEMOGLOBIN: 13.7 g/dL (ref 12.0–15.0)
LYMPHS ABS: 4.8 10*3/uL — AB (ref 0.7–4.0)
Lymphocytes Relative: 32 %
MCH: 28.7 pg (ref 26.0–34.0)
MCHC: 33.6 g/dL (ref 30.0–36.0)
MCV: 85.4 fL (ref 78.0–100.0)
Monocytes Absolute: 1 10*3/uL (ref 0.1–1.0)
Monocytes Relative: 7 %
NEUTROS PCT: 59 %
Neutro Abs: 8.8 10*3/uL — ABNORMAL HIGH (ref 1.7–7.7)
PLATELETS: 277 10*3/uL (ref 150–400)
RBC: 4.78 MIL/uL (ref 3.87–5.11)
RDW: 13.1 % (ref 11.5–15.5)
WBC: 14.8 10*3/uL — AB (ref 4.0–10.5)

## 2017-07-11 LAB — BASIC METABOLIC PANEL
ANION GAP: 7 (ref 5–15)
BUN: 11 mg/dL (ref 6–20)
CHLORIDE: 107 mmol/L (ref 101–111)
CO2: 26 mmol/L (ref 22–32)
Calcium: 10.3 mg/dL (ref 8.9–10.3)
Creatinine, Ser: 0.59 mg/dL (ref 0.44–1.00)
GFR calc Af Amer: >60 mL/min (ref >60)
Glucose, Bld: 88 mg/dL (ref 65–99)
POTASSIUM: 3.9 mmol/L (ref 3.5–5.1)
Sodium: 140 mmol/L (ref 135–145)

## 2017-07-11 SURGERY — EVACUATION, HEMATOMA, BREAST
Anesthesia: General | Site: Breast | Laterality: Left

## 2017-07-11 MED ORDER — CEFAZOLIN SODIUM-DEXTROSE 2-4 GM/100ML-% IV SOLN
2.0000 g | INTRAVENOUS | Status: AC
Start: 2017-07-11 — End: 2017-07-11
  Administered 2017-07-11: 2 g via INTRAVENOUS
  Filled 2017-07-11: qty 100

## 2017-07-11 MED ORDER — HYDROMORPHONE HCL 1 MG/ML IJ SOLN
INTRAMUSCULAR | Status: AC
  Filled 2017-07-11: qty 1

## 2017-07-11 MED ORDER — FENTANYL CITRATE (PF) 100 MCG/2ML IJ SOLN
INTRAMUSCULAR | Status: AC
  Filled 2017-07-11: qty 2

## 2017-07-11 MED ORDER — DIPHENHYDRAMINE HCL 50 MG/ML IJ SOLN: 12.5000 mg | Freq: Four times a day (QID) | INTRAMUSCULAR | Status: DC | PRN

## 2017-07-11 MED ORDER — DEXAMETHASONE SODIUM PHOSPHATE 10 MG/ML IJ SOLN
INTRAMUSCULAR | Status: AC
  Filled 2017-07-11: qty 1

## 2017-07-11 MED ORDER — PROMETHAZINE HCL 25 MG/ML IJ SOLN: 6.2500 mg | INTRAMUSCULAR | Status: DC | PRN

## 2017-07-11 MED ORDER — PROPOFOL 10 MG/ML IV BOLUS
INTRAVENOUS | Status: DC | PRN
  Administered 2017-07-11: 170 mg via INTRAVENOUS

## 2017-07-11 MED ORDER — ONDANSETRON HCL 4 MG/2ML IJ SOLN
INTRAMUSCULAR | Status: DC | PRN
  Administered 2017-07-11: 4 mg via INTRAVENOUS

## 2017-07-11 MED ORDER — OXYCODONE HCL 5 MG PO TABS
5.0000 mg | ORAL_TABLET | ORAL | Status: DC | PRN
  Administered 2017-07-12: 5 mg via ORAL
  Filled 2017-07-11: qty 1

## 2017-07-11 MED ORDER — ACETAMINOPHEN 10 MG/ML IV SOLN
INTRAVENOUS | Status: AC
  Filled 2017-07-11: qty 100

## 2017-07-11 MED ORDER — BUPIVACAINE-EPINEPHRINE (PF) 0.5% -1:200000 IJ SOLN
INTRAMUSCULAR | Status: AC
  Filled 2017-07-11: qty 30

## 2017-07-11 MED ORDER — MIDAZOLAM HCL 5 MG/5ML IJ SOLN
INTRAMUSCULAR | Status: DC | PRN
  Administered 2017-07-11: 2 mg via INTRAVENOUS

## 2017-07-11 MED ORDER — DEXAMETHASONE SODIUM PHOSPHATE 4 MG/ML IJ SOLN
INTRAMUSCULAR | Status: DC | PRN
  Administered 2017-07-11: 10 mg via INTRAVENOUS

## 2017-07-11 MED ORDER — ENOXAPARIN SODIUM 40 MG/0.4ML ~~LOC~~ SOLN: 40.0000 mg | SUBCUTANEOUS | Status: DC

## 2017-07-11 MED ORDER — DIPHENHYDRAMINE HCL 12.5 MG/5ML PO ELIX: 12.5000 mg | ORAL_SOLUTION | Freq: Four times a day (QID) | ORAL | Status: DC | PRN

## 2017-07-11 MED ORDER — ONDANSETRON 4 MG PO TBDP: 4.0000 mg | ORAL_TABLET | Freq: Four times a day (QID) | ORAL | Status: DC | PRN

## 2017-07-11 MED ORDER — LIDOCAINE 2% (20 MG/ML) 5 ML SYRINGE
INTRAMUSCULAR | Status: AC
  Filled 2017-07-11: qty 5

## 2017-07-11 MED ORDER — MIDAZOLAM HCL 2 MG/2ML IJ SOLN
INTRAMUSCULAR | Status: AC
  Filled 2017-07-11: qty 2

## 2017-07-11 MED ORDER — CEFAZOLIN SODIUM-DEXTROSE 2-4 GM/100ML-% IV SOLN
INTRAVENOUS | Status: AC
  Filled 2017-07-11: qty 100

## 2017-07-11 MED ORDER — PROPOFOL 10 MG/ML IV BOLUS
INTRAVENOUS | Status: AC
  Filled 2017-07-11: qty 20

## 2017-07-11 MED ORDER — ONDANSETRON HCL 4 MG/2ML IJ SOLN
INTRAMUSCULAR | Status: AC
  Filled 2017-07-11: qty 2

## 2017-07-11 MED ORDER — ONDANSETRON HCL 4 MG/2ML IJ SOLN: 4.0000 mg | Freq: Four times a day (QID) | INTRAMUSCULAR | Status: DC | PRN

## 2017-07-11 MED ORDER — SENNOSIDES-DOCUSATE SODIUM 8.6-50 MG PO TABS: 1.0000 | ORAL_TABLET | Freq: Every evening | ORAL | Status: DC | PRN

## 2017-07-11 MED ORDER — LACTATED RINGERS IV SOLN
INTRAVENOUS | Status: DC | PRN
  Administered 2017-07-11: 16:00:00 via INTRAVENOUS

## 2017-07-11 MED ORDER — LIP MEDEX EX OINT
TOPICAL_OINTMENT | CUTANEOUS | Status: AC
  Administered 2017-07-11: 19:00:00
  Filled 2017-07-11: qty 7

## 2017-07-11 MED ORDER — HYDROMORPHONE HCL 1 MG/ML IJ SOLN
0.2500 mg | INTRAMUSCULAR | Status: DC | PRN
  Administered 2017-07-11 (×2): 0.5 mg via INTRAVENOUS

## 2017-07-11 MED ORDER — MEPERIDINE HCL 50 MG/ML IJ SOLN: 6.2500 mg | INTRAMUSCULAR | Status: DC | PRN

## 2017-07-11 MED ORDER — 0.9 % SODIUM CHLORIDE (POUR BTL) OPTIME
TOPICAL | Status: DC | PRN
  Administered 2017-07-11: 1000 mL

## 2017-07-11 MED ORDER — BUPIVACAINE-EPINEPHRINE (PF) 0.5% -1:200000 IJ SOLN
INTRAMUSCULAR | Status: DC | PRN
  Administered 2017-07-11: 30 mL

## 2017-07-11 MED ORDER — ACETAMINOPHEN 500 MG PO TABS
1000.0000 mg | ORAL_TABLET | Freq: Four times a day (QID) | ORAL | Status: DC
  Administered 2017-07-11 – 2017-07-12 (×2): 1000 mg via ORAL
  Filled 2017-07-11 (×2): qty 2

## 2017-07-11 MED ORDER — FENTANYL CITRATE (PF) 100 MCG/2ML IJ SOLN
INTRAMUSCULAR | Status: DC | PRN
  Administered 2017-07-11: 50 ug via INTRAVENOUS
  Administered 2017-07-11 (×2): 25 ug via INTRAVENOUS
  Administered 2017-07-11: 50 ug via INTRAVENOUS
  Administered 2017-07-11 (×2): 25 ug via INTRAVENOUS

## 2017-07-11 MED ORDER — KCL IN DEXTROSE-NACL 20-5-0.45 MEQ/L-%-% IV SOLN
INTRAVENOUS | Status: DC
  Administered 2017-07-11: 20:00:00 via INTRAVENOUS
  Filled 2017-07-11: qty 1000

## 2017-07-11 MED ORDER — HYDROCODONE-ACETAMINOPHEN 7.5-325 MG PO TABS: 1.0000 | ORAL_TABLET | Freq: Once | ORAL | Status: DC | PRN

## 2017-07-11 MED ORDER — MORPHINE SULFATE (PF) 2 MG/ML IV SOLN: 2.0000 mg | INTRAVENOUS | Status: DC | PRN

## 2017-07-11 MED ORDER — LIDOCAINE 2% (20 MG/ML) 5 ML SYRINGE
INTRAMUSCULAR | Status: DC | PRN
  Administered 2017-07-11: 100 mg via INTRAVENOUS

## 2017-07-11 MED ORDER — ACETAMINOPHEN 10 MG/ML IV SOLN
1000.0000 mg | Freq: Once | INTRAVENOUS | Status: DC | PRN
  Administered 2017-07-11: 1000 mg via INTRAVENOUS

## 2017-07-11 MED ORDER — MORPHINE SULFATE (PF) 4 MG/ML IV SOLN
4.0000 mg | Freq: Once | INTRAVENOUS | Status: AC
  Administered 2017-07-11: 4 mg via INTRAVENOUS
  Filled 2017-07-11: qty 1

## 2017-07-11 SURGICAL SUPPLY — 17 items
CLOSURE WOUND 1/2 X4 (GAUZE/BANDAGES/DRESSINGS) ×1
DRAPE LAPAROTOMY TRNSV 102X78 (DRAPE) ×2 IMPLANT
GLOVE BIO SURGEON STRL SZ 6.5 (GLOVE) ×1 IMPLANT
GLOVE BIO SURGEONS STRL SZ 6.5 (GLOVE) ×1
GLOVE BIOGEL PI IND STRL 7.0 (GLOVE) IMPLANT
GLOVE BIOGEL PI INDICATOR 7.0 (GLOVE) ×2
GOWN SPEC L3 XXLG W/TWL (GOWN DISPOSABLE) ×2 IMPLANT
GOWN STRL REUS W/ TWL XL LVL3 (GOWN DISPOSABLE) ×1 IMPLANT
GOWN STRL REUS W/TWL XL LVL3 (GOWN DISPOSABLE) ×3
KIT BASIN OR (CUSTOM PROCEDURE TRAY) ×2 IMPLANT
NEEDLE HYPO 22GX1.5 SAFETY (NEEDLE) ×2 IMPLANT
PACK GENERAL/GYN (CUSTOM PROCEDURE TRAY) ×2 IMPLANT
STRIP CLOSURE SKIN 1/2X4 (GAUZE/BANDAGES/DRESSINGS) ×1 IMPLANT
SUT SILK 2 0 SH (SUTURE) ×3 IMPLANT
SUT VIC AB 3-0 SH 27 (SUTURE) ×3
SUT VIC AB 3-0 SH 27XBRD (SUTURE) IMPLANT
SYR CONTROL 10ML LL (SYRINGE) ×2 IMPLANT

## 2017-07-11 NOTE — ED Triage Notes (Signed)
Per PT: Pt recently had a lumpectomy (7cm lump) from the left breast on the 15th of January. Pt reports she has had no fevers or other complications and then today she felt a lot of pain in the tail of spence and then blood was found in her bra. The breast is hard to the touch, red, and tender

## 2017-07-11 NOTE — Anesthesia Preprocedure Evaluation (Signed)
Anesthesia Evaluation  Patient identified by MRN, date of birth, ID band Patient awake    Reviewed: Allergy & Precautions, NPO status , Patient's Chart, lab work & pertinent test results  Airway Mallampati: II  TM Distance: >3 FB Neck ROM: Full    Dental no notable dental hx.    Pulmonary neg pulmonary ROS, former smoker,    Pulmonary exam normal breath sounds clear to auscultation       Cardiovascular negative cardio ROS Normal cardiovascular exam Rhythm:Regular Rate:Normal     Neuro/Psych negative neurological ROS  negative psych ROS   GI/Hepatic negative GI ROS, Neg liver ROS,   Endo/Other  negative endocrine ROS  Renal/GU negative Renal ROS  negative genitourinary   Musculoskeletal negative musculoskeletal ROS (+)   Abdominal   Peds  Hematology negative hematology ROS (+)   Anesthesia Other Findings   Reproductive/Obstetrics                             Anesthesia Physical Anesthesia Plan  ASA: II  Anesthesia Plan: General   Post-op Pain Management:    Induction:   PONV Risk Score and Plan: 4 or greater and Treatment may vary due to age or medical condition, Dexamethasone and Ondansetron  Airway Management Planned: LMA  Additional Equipment:   Intra-op Plan:   Post-operative Plan: Extubation in OR  Informed Consent: I have reviewed the patients History and Physical, chart, labs and discussed the procedure including the risks, benefits and alternatives for the proposed anesthesia with the patient or authorized representative who has indicated his/her understanding and acceptance.   Dental advisory given  Plan Discussed with: CRNA and Anesthesiologist  Anesthesia Plan Comments:         Anesthesia Quick Evaluation

## 2017-07-11 NOTE — Anesthesia Procedure Notes (Signed)
Procedure Name: LMA Insertion Date/Time: 07/11/2017 3:52 PM Performed by: Claudia Desanctis, CRNA Pre-anesthesia Checklist: Emergency Drugs available, Patient identified, Suction available and Patient being monitored Patient Re-evaluated:Patient Re-evaluated prior to induction Oxygen Delivery Method: Circle system utilized Preoxygenation: Pre-oxygenation with 100% oxygen Induction Type: IV induction Ventilation: Mask ventilation without difficulty LMA: LMA inserted LMA Size: 5.0 Number of attempts: 1 Placement Confirmation: positive ETCO2 and breath sounds checked- equal and bilateral Tube secured with: Tape Dental Injury: Teeth and Oropharynx as per pre-operative assessment

## 2017-07-11 NOTE — ED Notes (Signed)
Pt reports Dr. Donne Hazel did her surgery.

## 2017-07-11 NOTE — Op Note (Signed)
07/11/2017  3:52 PM  PATIENT:  Katie Robinson  62 y.o. female  Patient Care Team: Tysinger, Camelia Eng, PA-C as PCP - General (Family Medicine) Magrinat, Virgie Dad, MD as Consulting Physician (Oncology) Gatha Mayer, MD as Consulting Physician (Gastroenterology)  PRE-OPERATIVE DIAGNOSIS:  hematoma left breast  POST-OPERATIVE DIAGNOSIS:  hematoma left breast  PROCEDURE:   EVACUATION HEMATOMA BREAST, LIGATION OF BLEEDING VESSEL    Surgeon(s): Leighton Ruff, MD  ASSISTANT: none   ANESTHESIA:   general  EBL:  71m  DRAINS: none   SPECIMEN:  No Specimen  DISPOSITION OF SPECIMEN:  N/A  COUNTS:  YES  PLAN OF CARE: Admit for overnight observation  PATIENT DISPOSITION:  PACU - hemodynamically stable.  INDICATION: 62y.o. F ~2 wks s/p lumpectomy by Dr [name]  She developed sharp pain and swelling within her surgical site acutely earlier this morning.  It was felt that she developed bleeding and hematoma.  Upon exam her entire medial breast was tense and swollen and very painful to touch.   OR FINDINGS: hematoma L breast, bleeding vessel in deep portion of the cavity.  DESCRIPTION: the patient was identified in the preoperative holding area and taken to the OR where they were laid supine on the operating room table.  General anesthesia was induced without difficulty. SCDs were also noted to be in place prior to the initiation of anesthesia.  The patient was then prepped and draped in the usual sterile fashion.   A surgical timeout was performed indicating the correct patient, procedure, positioning and need for preoperative antibiotics.   I began by making an incision through the patient's previous incision using a 15 blade scalpel.  The sutures were also incised.  The cavity was opened and the hematoma was evacuated using suction.  Once the hematoma was evacuated, I was able to identify a pulsatile bleeding vessel in the deep portion of the wound.  This was suture ligated with  a 2-0 silk suture.  The cavity was then copiously irrigated with normal saline.  There was no further bleeding noted.  I used 3-0 interrupted Vicryl sutures to reapproximate the upper portion of the cavity.  The skin incision was closed again using 4-0 Vicryl running subcuticular suture.  Steri-Strips and a sterile dressing were applied.  The patient was awakened from anesthesia and sent to the postanesthesia care unit in stable condition.  All counts were correct per operating room staff.

## 2017-07-11 NOTE — H&P (Signed)
Katie Robinson is an 62 y.o. female.   Chief Complaint: L breast pain HPI: 62 y.o. F s/p lumpectomy on 1/15 by Dr Donne Hazel.  She states she was healing well until she bent over this morning to pick something up and felt a "pop".  She then developed swelling quickly at the lumpectomy site as well as acute pain and bloody drainage.  She presented to the ED for evaluation.  Past Medical History:  Diagnosis Date  . Allergy    SEASONAL  . Anemia   . Arthralgia   . Asthma    controlled  . Cancer ([place]) 05/2017   left breast cancer  . Chronic headache    many years  . Family history of premature coronary artery disease    father  . Former smoker    10 pack year history  . GERD (gastroesophageal reflux disease)    mild, occasional  . H/O bone density study 11/2015   normal  . H/O cardiovascular stress test August 18, 2007  . Hx of adenomatous and sessile serrated colonic polyps 05/24/2014  . Iron deficiency 11/29/2015  . Obesity   . Pectus excavatum    mild, left asymmetric  . S/P hysterectomy    due to fibroids  . Seasonal allergic rhinitis   . UC (ulcerative colitis) ([place]) 08/17/13   Dr. Carlean Purl  . Urinary tract infection 2011-08-18  . Vitamin D deficiency 07/18/2014  . Wears glasses     Past Surgical History:  Procedure Laterality Date  . BREAST LUMPECTOMY WITH RADIOACTIVE SEED AND SENTINEL LYMPH NODE BIOPSY Left 06/29/2017   Procedure: LEFT BREAST LUMPECTOMY WITH RADIOACTIVE SEED AND LEFT AXILLARY SENTINEL LYMPH NODE BIOPSY ERAS PATHWAY;  Surgeon: Rolm Bookbinder, MD;  Location: [facility];  Service: General;  Laterality: Left;  . COLONOSCOPY  02/2016   polyps; Dr. Silvano Rusk  . FOOT TENDON SURGERY Left 08/18/11  . NAVEL REMOVED     AS A CHILD  . OTHER SURGICAL HISTORY     biopsy for possible sarcoidosis  . POLYPECTOMY    . SKIN BIOPSY    . TONSILLECTOMY    . TOTAL ABDOMINAL HYSTERECTOMY  17-Aug-2000   total; Dr. Molli Posey  . WISDOM TOOTH EXTRACTION      Family History   Problem Relation Age of Onset  . Asthma Father   . Heart disease Father 61       died of MI mid 2022-08-17  . Hypertension Sister   . Asthma Sister   . Hypertension Mother   . Aneurysm Mother        brain  . Alzheimer's disease Mother   . Heart disease Mother   . Cancer Neg Hx   . Stroke Neg Hx   . Diabetes Neg Hx   . Colon cancer Neg Hx   . Colon polyps Neg Hx   . Crohn's disease Neg Hx   . Ulcerative colitis Neg Hx    Social History:  reports that she quit smoking about 29 years ago. Her smoking use included cigarettes. she has never used smokeless tobacco. She reports that she drinks alcohol. She reports that she does not use drugs.  Allergies: No Known Allergies   (Not in a hospital admission)  Results for orders placed or performed during the hospital encounter of 07/11/17 (from the past 48 hour(s))  CBC with Differential/Platelet     Status: Abnormal   Collection Time: 07/11/17  2:00 PM  Result Value Ref Range   WBC 14.8 (H)  4.0 - 10.5 K/uL   RBC 4.78 3.87 - 5.11 MIL/uL   Hemoglobin 13.7 12.0 - 15.0 g/dL   HCT 40.8 36.0 - 46.0 %   MCV 85.4 78.0 - 100.0 fL   MCH 28.7 26.0 - 34.0 pg   MCHC 33.6 30.0 - 36.0 g/dL   RDW 13.1 11.5 - 15.5 %   Platelets 277 150 - 400 K/uL   Neutrophils Relative % 59 %   Neutro Abs 8.8 (H) 1.7 - 7.7 K/uL   Lymphocytes Relative 32 %   Lymphs Abs 4.8 (H) 0.7 - 4.0 K/uL   Monocytes Relative 7 %   Monocytes Absolute 1.0 0.1 - 1.0 K/uL   Eosinophils Relative 2 %   Eosinophils Absolute 0.3 0.0 - 0.7 K/uL   Basophils Relative 0 %   Basophils Absolute 0.0 0.0 - 0.1 K/uL   No results found.  Review of Systems  Constitutional: Negative for chills and fever.  HENT: Negative for hearing loss.   Eyes: Negative for blurred vision and photophobia.  Respiratory: Negative for cough and shortness of breath.   Cardiovascular: Negative for chest pain and palpitations.  Gastrointestinal: Negative for nausea and vomiting.  Genitourinary: Negative for  dysuria and urgency.  Musculoskeletal: Negative for myalgias.  Skin: Negative for itching and rash.    Blood pressure (!) 193/110, pulse 85, temperature 98.2 F (36.8 C), temperature source Oral, resp. rate 18, height 6' (1.829 m), weight 108 kg (238 lb), SpO2 100 %. Physical Exam  Constitutional: She is oriented to person, place, and time. She appears well-developed and well-nourished.  HENT:  Head: Normocephalic and atraumatic.  Eyes: Conjunctivae and EOM are normal. Pupils are equal, round, and reactive to light.  Neck: Normal range of motion. Neck supple.  Cardiovascular: Normal rate and regular rhythm.  Respiratory: Effort normal and breath sounds normal. She exhibits tenderness (tense L breast).  Musculoskeletal: Normal range of motion.  Neurological: She is alert and oriented to person, place, and time.  Skin: Skin is warm and dry.     Assessment/Plan 62 y.o. F ~12 days post lumpectomy.  Her exam and history is c/w bleeding and hematoma.  I have recommended evacuation and irrigation.  We will stop any bleeding if found.  We discussed the risks and benefits of surgical and non-surgical treatment.  We discussed that she will need to stay overnight for observation.    Rosario Adie., MD 09/28/8307, 3:04 PM

## 2017-07-11 NOTE — Transfer of Care (Signed)
Immediate Anesthesia Transfer of Care Note  Patient: Katie Robinson  Procedure(s) Performed: EVACUATION HEMATOMA BREAST (Left Breast)  Patient Location: PACU  Anesthesia Type:General  Level of Consciousness: awake, alert  and oriented  Airway & Oxygen Therapy: Patient Spontanous Breathing and Patient connected to face mask  Post-op Assessment: Report given to RN and Post -op Vital signs reviewed and stable  Post vital signs: Reviewed and stable  Last Vitals:  Vitals:   07/11/17 1316  BP: (!) 193/110  Pulse: 85  Resp: 18  Temp: 36.8 C  SpO2: 100%    Last Pain:  Vitals:   07/11/17 1508  TempSrc:   PainSc: 6          Complications: No apparent anesthesia complications

## 2017-07-11 NOTE — ED Provider Notes (Signed)
[facility] DEPT Provider Note   CSN: [id number] Arrival date & time: 07/11/17  1302     History   Chief Complaint Chief Complaint  Patient presents with  . post surgical complication  . Breast Pain    HPI Katie Robinson is a 62 y.o. female.  62 year old female presents with left-sided breast pain and swelling which began after she bent over today while at church.  States that she is now has increased swelling at the top and bottom of her breast.  She had a lumpectomy performed 12 days ago.  She noted bright red blood coming from her Steri-Strips which is since resolved.  Denies any nipple drainage.  No fever or chills.  Denies being lightheaded or dizzy.  No direct trauma to her breast.  No treatment used prior to arrival      Past Medical History:  Diagnosis Date  . Allergy    SEASONAL  . Anemia   . Arthralgia   . Asthma    controlled  . Cancer ([place]) 05/2017   left breast cancer  . Chronic headache    many years  . Family history of premature coronary artery disease    father  . Former smoker    10 pack year history  . GERD (gastroesophageal reflux disease)    mild, occasional  . H/O bone density study 11/2015   normal  . H/O cardiovascular stress test 2009  . Hx of adenomatous and sessile serrated colonic polyps 05/24/2014  . Iron deficiency 11/29/2015  . Obesity   . Pectus excavatum    mild, left asymmetric  . S/P hysterectomy    due to fibroids  . Seasonal allergic rhinitis   . UC (ulcerative colitis) ([place]) 2015   Dr. Carlean Purl  . Urinary tract infection 2013  . Vitamin D deficiency 07/18/2014  . Wears glasses     Patient Active Problem List   Diagnosis Date Noted  . 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 Ductal carcinoma in situ (DCIS) of left breast 06/18/2017  . Need for influenza vaccination 05/10/2017  . Screening for breast cancer 05/10/2017  .  Ulcerative colitis with complication ([place]) 54/56/2563  . Mild persistent asthma without complication 89/37/3428  . Allergic rhinitis due to pollen 05/10/2017  . Right hand paresthesia 05/10/2017  . Normocytic anemia   . Iron deficiency 11/29/2015  . Vitamin D deficiency 07/18/2014  . Hx of adenomatous and sessile serrated colonic polyps 05/24/2014    Past Surgical History:  Procedure Laterality Date  . BREAST LUMPECTOMY WITH RADIOACTIVE SEED AND SENTINEL LYMPH NODE BIOPSY Left 06/29/2017   Procedure: LEFT BREAST LUMPECTOMY WITH RADIOACTIVE SEED AND LEFT AXILLARY SENTINEL LYMPH NODE BIOPSY ERAS PATHWAY;  Surgeon: Rolm Bookbinder, MD;  Location: [facility];  Service: General;  Laterality: Left;  . COLONOSCOPY  02/2016   polyps; Dr. Silvano Rusk  . FOOT TENDON SURGERY Left 2013  . NAVEL REMOVED     AS A CHILD  . OTHER SURGICAL HISTORY     biopsy for possible sarcoidosis  . POLYPECTOMY    . SKIN BIOPSY    . TONSILLECTOMY    . TOTAL ABDOMINAL HYSTERECTOMY  2002   total; Dr. Molli Posey  . WISDOM TOOTH EXTRACTION      OB History    No data available       Home Medications    Prior to Admission medications   Medication Sig  Start Date End Date Taking? Authorizing Provider  Adalimumab (HUMIRA) 40 MG/0.8ML PSKT Inject into the skin.    [provider]  albuterol (PROVENTIL HFA;VENTOLIN HFA) 108 (90 Base) MCG/ACT inhaler Inhale 2 puffs into the lungs every 6 (six) hours as needed for wheezing or shortness of breath. 05/10/17   Tysinger, Camelia Eng, PA-C  Cholecalciferol (VITAMIN D3) 1000 units CAPS Take by mouth.    [provider]  oxyCODONE (OXY IR/ROXICODONE) 5 MG immediate release tablet Take 1 tablet (5 mg total) by mouth every 6 (six) hours as needed for moderate pain, severe pain or breakthrough pain. 06/29/17   Rolm Bookbinder, MD    Family History Family History  Problem Relation Age of Onset  . Asthma Father   . Heart disease  Father 80       died of MI mid 17-Aug-2022  . Hypertension Sister   . Asthma Sister   . Hypertension Mother   . Aneurysm Mother        brain  . Alzheimer's disease Mother   . Heart disease Mother   . Cancer Neg Hx   . Stroke Neg Hx   . Diabetes Neg Hx   . Colon cancer Neg Hx   . Colon polyps Neg Hx   . Crohn's disease Neg Hx   . Ulcerative colitis Neg Hx     Social History Social History   Tobacco Use  . Smoking status: Former Smoker    Types: Cigarettes    Last attempt to quit: 06/15/1988    Years since quitting: 29.0  . Smokeless tobacco: Never Used  Substance Use Topics  . Alcohol use: Yes    Alcohol/week: 0.0 oz    Comment: rarely  . Drug use: No     Allergies   Patient has no known allergies.   Review of Systems Review of Systems  All other systems reviewed and are negative.    Physical Exam Updated Vital Signs BP (!) 193/110 (BP Location: Right Arm)   Pulse 85   Temp 98.2 F (36.8 C) (Oral)   Resp 18   Ht 1.829 m (6')   Wt 108 kg (238 lb)   SpO2 100%   BMI 32.28 kg/m   Physical Exam  Constitutional: She is oriented to person, place, and time. She appears well-developed and well-nourished.  Non-toxic appearance. No distress.  HENT:  Head: Normocephalic and atraumatic.  Eyes: Conjunctivae, EOM and lids are normal. Pupils are equal, round, and reactive to light.  Neck: Normal range of motion. Neck supple. No tracheal deviation present. No thyroid mass present.  Cardiovascular: Normal rate, regular rhythm and normal heart sounds. Exam reveals no gallop.  No murmur heard. Pulmonary/Chest: Effort normal and breath sounds normal. No stridor. No respiratory distress. She has no decreased breath sounds. She has no wheezes. She has no rhonchi. She has no rales.    Abdominal: Soft. Normal appearance and bowel sounds are normal. She exhibits no distension. There is no tenderness. There is no rebound and no CVA tenderness.  Musculoskeletal: Normal range of  motion. She exhibits no edema or tenderness.  Neurological: She is alert and oriented to person, place, and time. She has normal strength. No cranial nerve deficit or sensory deficit. GCS eye subscore is 4. GCS verbal subscore is 5. GCS motor subscore is 6.  Skin: Skin is warm and dry. No abrasion and no rash noted.  Psychiatric: She has a normal mood and affect. Her speech is normal and behavior is  normal.  Nursing note and vitals reviewed.    ED Treatments / Results  Labs (all labs ordered are listed, but only abnormal results are displayed) Labs Reviewed  CBC WITH DIFFERENTIAL/PLATELET  BASIC METABOLIC PANEL  TYPE AND SCREEN    EKG  EKG Interpretation None       Radiology No results found.  Procedures Procedures (including critical care time)  Medications Ordered in ED Medications - No data to display   Initial Impression / Assessment and Plan / ED Course  I have reviewed the triage vital signs and the nursing notes.  Pertinent labs & imaging results that were available during my care of the patient were reviewed by me and considered in my medical decision making (see chart for details).     Discussed with surgeon on call Dr. Marcello Moores was seen the patient and patient to go to the OR  Final Clinical Impressions(s) / ED Diagnoses   Final diagnoses:  None    ED Discharge Orders    None       Lacretia Leigh, MD 07/11/17 1510

## 2017-07-12 ENCOUNTER — Encounter (HOSPITAL_COMMUNITY): Payer: Self-pay | Admitting: General Surgery

## 2017-07-12 ENCOUNTER — Ambulatory Visit: Payer: 59 | Admitting: Internal Medicine

## 2017-07-12 ENCOUNTER — Telehealth: Payer: Self-pay | Admitting: Internal Medicine

## 2017-07-12 LAB — ABO/RH: ABO/RH(D): B POS

## 2017-07-12 NOTE — Discharge Instructions (Signed)
[facility] Office Phone Number [phone] POST OP INSTRUCTIONS  Always review your discharge instruction sheet given to you by the facility where your surgery was performed.  IF YOU HAVE DISABILITY OR FAMILY LEAVE FORMS, YOU MUST BRING THEM TO THE OFFICE FOR PROCESSING.  DO NOT GIVE THEM TO YOUR DOCTOR.  1. A prescription for pain medication may be given to you upon discharge.  Take your pain medication as prescribed, if needed.  If narcotic pain medicine is not needed, then you may take acetaminophen (Tylenol), naprosyn (Alleve) or ibuprofen (Advil) as needed. 2. Take your usually prescribed medications unless otherwise directed 3. If you need a refill on your pain medication, please contact your pharmacy.  They will contact our office to request authorization.  Prescriptions will not be filled after 5pm or on week-ends. 4. You should eat very light the first 24 hours after surgery, such as soup, crackers, pudding, etc.  Resume your normal diet the day after surgery. 5. Most patients will experience some swelling and bruising in the breast.  Ice packs and a good support bra will help.  Wear the breast binder provided or a sports bra for 72 hours day and night.  After that wear a sports bra during the day until you return to the office. Swelling and bruising can take several days to resolve.  6. It is common to experience some constipation if taking pain medication after surgery.  Increasing fluid intake and taking a stool softener will usually help or prevent this problem from occurring.  A mild laxative (Milk of Magnesia or Miralax) should be taken according to package directions if there are no bowel movements after 48 hours. 7. Unless discharge instructions indicate otherwise, you may remove your bandages 48 hours after surgery and you may shower at that time.  You may have steri-strips (small skin tapes) in place directly over the incision.  These strips should be left on the  skin for 7-10 days and will come off on their own.  If your surgeon used skin glue on the incision, you may shower in 24 hours.  The glue will flake off over the next 2-3 weeks.  Any sutures or staples will be removed at the office during your follow-up visit. 8. ACTIVITIES:  You may resume regular daily activities (gradually increasing) beginning the next day.  Wearing a good support bra or sports bra minimizes pain and swelling.  You may have sexual intercourse when it is comfortable. a. You may drive when you no longer are taking prescription pain medication, you can comfortably wear a seatbelt, and you can safely maneuver your car and apply brakes. b. RETURN TO WORK:  ______________________________________________________________________________________ 9. You should see your doctor in the office for a follow-up appointment approximately two weeks after your surgery.  Your doctors nurse will typically make your follow-up appointment when she calls you with your pathology report.  Expect your pathology report 3-4 business days after your surgery.  You may call to check if you do not hear from [place] after three days. 10. OTHER INSTRUCTIONS: _______________________________________________________________________________________________ _____________________________________________________________________________________________________________________________________ _____________________________________________________________________________________________________________________________________ _____________________________________________________________________________________________________________________________________  WHEN TO CALL DR Zianna Dercole: 1. Fever over 101.0 2. Nausea and/or vomiting. 3. Extreme swelling or bruising. 4. Continued bleeding from incision. 5. Increased pain, redness, or drainage from the incision.  The clinic staff is available to answer your questions during regular  business hours.  Please dont hesitate to call and ask to speak to one of the nurses for clinical concerns.  If you  have a medical emergency, go to the nearest emergency room or call 911.  A surgeon from [facility] is always on call at the hospital.  For further questions, please visit [email or web address]

## 2017-07-12 NOTE — Anesthesia Postprocedure Evaluation (Signed)
Anesthesia Post Note  Patient: Katie Robinson  Procedure(s) Performed: EVACUATION HEMATOMA BREAST (Left Breast)     Patient location during evaluation: PACU Anesthesia Type: General Level of consciousness: awake and alert Pain management: pain level controlled Vital Signs Assessment: post-procedure vital signs reviewed and stable Respiratory status: spontaneous breathing, nonlabored ventilation, respiratory function stable and patient connected to nasal cannula oxygen Cardiovascular status: blood pressure returned to baseline and stable Postop Assessment: no apparent nausea or vomiting Anesthetic complications: no    Last Vitals:  Vitals:   07/12/17 0135 07/12/17 0602  BP: 124/74 135/70  Pulse: 95 79  Resp: 17 16  Temp: 37.2 C 36.7 C  SpO2: 94% 95%    Last Pain:  Vitals:   07/12/17 0602  TempSrc: Oral  PainSc:                  Barnet Glasgow

## 2017-07-12 NOTE — Discharge Summary (Signed)
Physician Discharge Summary  Patient ID: Katie Robinson MRN: [id number] DOB/AGE: 61-Oct-1957 40 y.o.  Admit date: 07/11/2017 Discharge date: 07/12/2017  Admission Diagnoses: Hematoma breast dcis left breast  Discharge Diagnoses:  Active Problems:   Hematoma of breast   Discharged Condition: good  Hospital Course: 49 yof about 2 weeks s/p left lump/sn for dcis. Path was no residual disease and negative sn.  She acutely had swelling of left breast with hematoma. Was taken to or for evacuation.  She is doing well following morning with no real pain.  Cbc stable. Will be discharged home  Consults: None  Significant Diagnostic Studies: none  Treatments: surgery: evacuation left breast hematoma  Discharge Exam: Blood pressure 135/70, pulse 79, temperature 98 F (36.7 C), temperature source Oral, resp. rate 16, height 6' (1.829 m), weight 108 kg (238 lb), SpO2 95 %. Breasts: left breast soft, no hematoma, dressing dry  Disposition: 01-Home or Self Care   Allergies as of 07/12/2017   No Known Allergies     Medication List    TAKE these medications   albuterol 108 (90 Base) MCG/ACT inhaler Commonly known as:  PROVENTIL HFA;VENTOLIN HFA Inhale 2 puffs into the lungs every 6 (six) hours as needed for wheezing or shortness of breath.   HUMIRA 40 MG/0.8ML Pskt Generic drug:  Adalimumab Inject 1 each into the skin every 14 (fourteen) days.   oxyCODONE 5 MG immediate release tablet Commonly known as:  Oxy IR/ROXICODONE Take 1 tablet (5 mg total) by mouth every 6 (six) hours as needed for moderate pain, severe pain or breakthrough pain.   triamcinolone 55 MCG/ACT Aero nasal inhaler Commonly known as:  NASACORT Place 2 sprays into the nose as needed (congestion).   Vitamin D3 1000 units Caps Take 2 capsules by mouth daily.      Follow-up Information    Rolm Bookbinder, MD Follow up in 1 week(s).   Specialty:  General Surgery Why:  as scheduled already Contact  information: [address] [place] [phone]           Signed: Rolm Bookbinder 07/12/2017, 8:06 AM

## 2017-07-12 NOTE — Telephone Encounter (Signed)
No charge. 

## 2017-07-13 NOTE — Progress Notes (Signed)
Location of Breast Cancer: Left Breast  Histology per Pathology Report:  06/14/17 Diagnosis Breast, left, needle core biopsy, LIQ - DUCTAL CARCINOMA IN SITU WITH CALCIFICATIONS. - FOCUS HIGHLY SUSPICIOUS FOR STROMAL INVASION.  Receptor Status: ER(50%), PR (NEG)  06/29/17 Diagnosis 1. Breast, lumpectomy, Left w/seed - EXTENSIVE BIOPSY SITE REACTION AND BIOPSY CLIPS. - NO RESIDUAL CARCINOMA. - FIBROCYSTIC CHANGES. - FINAL MARGINS CLEAR. 2. Lymph node, sentinel, biopsy, Left Axillary - ONE BENIGN LYMPH NODE (0/1). 3. Lymph node, sentinel, biopsy, Left - ONE BENIGN LYMPH NODE (0/1). 4. Lymph node, sentinel, biopsy, Left - ONE BENIGN LYMPH NODE (0/1). 5. Lymph node, sentinel, biopsy, Left - ONE BENIGN LYMPH NODE (0/1). 6. Lymph node, sentinel, biopsy, Left - ONE BENIGN LYMPH NODE (0/1).  Did patient present with symptoms or was this found on screening mammography?: It was found on a screening mammogram.   Past/Anticipated interventions by surgeon, if any: 06/29/17 Procedure:Leftbreast seed guided lumpectomy Leftdeep axillary sentinel node biopsy Surgeon: Dr Serita Grammes  07/11/17 PROCEDURE:   EVACUATION HEMATOMA BREAST, LIGATION OF BLEEDING VESSEL  Surgeon(s): Leighton Ruff, MD   Past/Anticipated interventions by medical oncology, if any:  07/03/17 Dr. Jana Hakim- [facility] (1) definitive surgery pending--- done 06/29/17 (2) adjuvant radiation to follow (3) antiestrogens to be considered at the completion of local treatment   Lymphedema issues, if any:  She denies. She has good arm mobility.   Pain issues, if any:  She reports pain a 2/10 at the site of lymph node removal.   SAFETY ISSUES:  Prior radiation? No  Pacemaker/ICD? No  Possible current pregnancy? No  Is the patient on methotrexate? No, she does take Humira injections once every other week.   Current Complaints / other details:   She has a follow up appointment with Dr. Donne Hazel tomorrow  07/21/17.   BP (!) 153/91   Pulse 76   Temp 98.7 F (37.1 C)   Wt 245 lb 9.6 oz (111.4 kg)   SpO2 100% Comment: room air  BMI 33.31 kg/m    Wt Readings from Last 3 Encounters:  07/20/17 245 lb 9.6 oz (111.4 kg)  07/11/17 238 lb (108 kg)  06/29/17 238 lb (108 kg)   Katie Robinson, Stephani Police, RN 07/13/2017,9:17 AM

## 2017-07-14 ENCOUNTER — Encounter (HOSPITAL_COMMUNITY): Payer: Self-pay | Admitting: General Surgery

## 2017-07-20 ENCOUNTER — Encounter: Payer: Self-pay | Admitting: Radiation Oncology

## 2017-07-20 ENCOUNTER — Ambulatory Visit
Admission: RE | Admit: 2017-07-20 | Discharge: 2017-07-20 | Disposition: A | Discharge status: Home / Self Care | Payer: 59 | Source: Ambulatory Visit | Attending: Radiation Oncology | Admitting: Radiation Oncology

## 2017-07-20 VITALS — BP 153/91 | HR 76 | Temp 98.7°F | Wt 245.6 lb

## 2017-07-20 DIAGNOSIS — Z170 Estrogen receptor positive status [ER+]: Principal | ICD-10-CM

## 2017-07-20 DIAGNOSIS — C50312 Malignant neoplasm of lower-inner quadrant of left female breast: Secondary | ICD-10-CM | POA: Diagnosis not present

## 2017-07-20 DIAGNOSIS — Q676 Pectus excavatum: Secondary | ICD-10-CM | POA: Diagnosis not present

## 2017-07-20 DIAGNOSIS — Z825 Family history of asthma and other chronic lower respiratory diseases: Secondary | ICD-10-CM | POA: Diagnosis not present

## 2017-07-20 DIAGNOSIS — Z8249 Family history of ischemic heart disease and other diseases of the circulatory system: Secondary | ICD-10-CM | POA: Diagnosis not present

## 2017-07-20 DIAGNOSIS — K219 Gastro-esophageal reflux disease without esophagitis: Secondary | ICD-10-CM | POA: Diagnosis not present

## 2017-07-20 DIAGNOSIS — Z820 Family history of epilepsy and other diseases of the nervous system: Secondary | ICD-10-CM | POA: Insufficient documentation

## 2017-07-20 DIAGNOSIS — Z90710 Acquired absence of both cervix and uterus: Secondary | ICD-10-CM | POA: Insufficient documentation

## 2017-07-20 DIAGNOSIS — Z87891 Personal history of nicotine dependence: Secondary | ICD-10-CM | POA: Insufficient documentation

## 2017-07-20 DIAGNOSIS — E559 Vitamin D deficiency, unspecified: Secondary | ICD-10-CM | POA: Diagnosis not present

## 2017-07-20 DIAGNOSIS — Z79899 Other long term (current) drug therapy: Secondary | ICD-10-CM | POA: Insufficient documentation

## 2017-07-20 DIAGNOSIS — Z87440 Personal history of urinary (tract) infections: Secondary | ICD-10-CM | POA: Diagnosis not present

## 2017-07-20 DIAGNOSIS — Z86010 Personal history of colonic polyps: Secondary | ICD-10-CM | POA: Diagnosis not present

## 2017-07-20 NOTE — Progress Notes (Addendum)
Radiation Oncology         [phone] ________________________________  Outpatient followup  Name: Katie Robinson MRN: [id number]  Date: 07/20/2017  DOB: 11/21/1955  CC:Tysinger, Camelia Eng, PA-C  Magrinat, Virgie Dad, MD   REFERRING PHYSICIAN: Magrinat, Virgie Dad, MD  DIAGNOSIS:    ICD-10-CM   1. 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, estrogen receptor positive ([place]) Staging form: Breast, AJCC 8th Edition - Clinical: Stage 0 (cTis (DCIS), cN0, cM0, G3, ER: Positive, PR: Negative) - Signed by Eppie Gibson, MD on 06/23/2017  Ductal carcinoma in situ (DCIS) of left breast Staging form: Breast, AJCC 8th Edition - Clinical stage from 06/23/2017: Stage 0 (cTis (DCIS), cN0, cM0, ER: Positive, PR: Negative, HER2: Not assessed ) - Unsigned Staging comments: Staged at breast conference on 1.9.19   Stage 0  Left Breast LIQ Ductal Carcinoma in situ ER: 50%, Positive / PR:0%, Negative , Grade High  CHIEF COMPLAINT: Here to discuss management of left breast cancer  HISTORY OF PRESENT ILLNESS::Katie Robinson is a 62 y.o. female who presented with left  breast DCIS.. It was discovered on screening mammogram. Following the discovery of the cancer on the mammogram, the pt underwent a biopsy on 06/14/17 that revealed, Ductal Carcinoma in SITU with calcifications, focus highly suspicious for stromal invasion with characteristics as described above in the diagnosis.  On 06/29/16 the patient had a left breast seed guided lumpectomy and left deep axillary sentinel node biopsy performed by Dr.Wakefield. Biopsy report from that day revealed that all lymph nodes were negative. No residual breast carcinoma in lumpectomy specimen. The pt underwent surgery on 07/11/2016 for evacuation hematoma breast, ligation of bleeding vessel by Dr.Thomas.   Pt was seen in by Dr.  Jana Hakim in [facility] on 07/03/17 and no chemotherapy is necessary. Pt presents with her daughter. Pt reports she has had epistaxis that has stopped after seeing ENT and she states that her surgery went really well. Pt notes that she works the night shift for the [employer].    PREVIOUS RADIATION THERAPY: No  PAST MEDICAL HISTORY:  has a past medical history of Allergy, Anemia, Arthralgia, Asthma, Cancer ([place]) (05/2017), Chronic headache, Family history of premature coronary artery disease, Former smoker, GERD (gastroesophageal reflux disease), H/O bone density study (11/2015), H/O cardiovascular stress test (2009), Hx of adenomatous and sessile serrated colonic polyps (05/24/2014), Iron deficiency (11/29/2015), Obesity, Pectus excavatum, S/P hysterectomy, Seasonal allergic rhinitis, UC (ulcerative colitis) ([place]) (2015), Urinary tract infection (2013), Vitamin D deficiency (07/18/2014), and Wears glasses.    PAST SURGICAL HISTORY: Past Surgical History:  Procedure Laterality Date  . BREAST LUMPECTOMY WITH RADIOACTIVE SEED AND SENTINEL LYMPH NODE BIOPSY Left 06/29/2017   Procedure: LEFT BREAST LUMPECTOMY WITH RADIOACTIVE SEED AND LEFT AXILLARY SENTINEL LYMPH NODE BIOPSY ERAS PATHWAY;  Surgeon: Rolm Bookbinder, MD;  Location: [facility];  Service: General;  Laterality: Left;  . COLONOSCOPY  02/2016   polyps; Dr. Silvano Rusk  . EVACUATION BREAST HEMATOMA Left 07/11/2017   Procedure: EVACUATION HEMATOMA BREAST;  Surgeon: Leighton Ruff, MD;  Location: [facility];  Service: General;  Laterality: Left;  . FOOT TENDON SURGERY Left 2013  . NAVEL REMOVED     AS A CHILD  . OTHER SURGICAL HISTORY     biopsy for possible sarcoidosis  . POLYPECTOMY    . SKIN BIOPSY    .  TONSILLECTOMY    . TOTAL ABDOMINAL HYSTERECTOMY  2002   total; Dr. Molli Posey  . WISDOM TOOTH EXTRACTION      FAMILY HISTORY: family history includes Alzheimer's disease in her mother; Aneurysm in her mother;  Asthma in her father and sister; Heart disease in her mother; Heart disease (age of onset: 80) in her father; Hypertension in her mother and sister.  SOCIAL HISTORY:  reports that she quit smoking about 29 years ago. Her smoking use included cigarettes. she has never used smokeless tobacco. She reports that she drinks alcohol. She reports that she does not use drugs.  ALLERGIES: Patient has no known allergies.  MEDICATIONS:  Current Outpatient Medications  Medication Sig Dispense Refill  . Adalimumab (HUMIRA) 40 MG/0.8ML PSKT Inject 1 each into the skin every 14 (fourteen) days.     Marland Kitchen albuterol (PROVENTIL HFA;VENTOLIN HFA) 108 (90 Base) MCG/ACT inhaler Inhale 2 puffs into the lungs every 6 (six) hours as needed for wheezing or shortness of breath. 18 g 1  . Cholecalciferol (VITAMIN D3) 1000 units CAPS Take 2 capsules by mouth daily.     Marland Kitchen oxyCODONE (OXY IR/ROXICODONE) 5 MG immediate release tablet Take 1 tablet (5 mg total) by mouth every 6 (six) hours as needed for moderate pain, severe pain or breakthrough pain. (Patient not taking: Reported on 07/20/2017) 10 tablet 0  . triamcinolone (NASACORT) 55 MCG/ACT AERO nasal inhaler Place 2 sprays into the nose as needed (congestion).     No current facility-administered medications for this encounter.     PHYSICAL EXAM:  weight is 245 lb 9.6 oz (111.4 kg). Her temperature is 98.7 F (37.1 C). Her blood pressure is 153/91 (abnormal) and her pulse is 76. Her oxygen saturation is 100%.   General: Alert and oriented, in no acute distress Psychiatric: Judgment and insight are intact. Affect is appropriate. Breasts: Swelling above the left axillary scar.  Firmness in the lower inner left breast consistent with seroma. Left breast is smaller than the right consistent with post op. Pt has a bruise in the UOQ and steri strips to the medial aspect to the left nipple     LABORATORY DATA:  Lab Results  Component Value Date   WBC 14.8 (H) 07/11/2017   HGB  13.7 07/11/2017   HCT 40.8 07/11/2017   MCV 85.4 07/11/2017   PLT 277 07/11/2017   CMP     Component Value Date/Time   NA 140 07/11/2017 1400   K 3.9 07/11/2017 1400   CL 107 07/11/2017 1400   CO2 26 07/11/2017 1400   GLUCOSE 88 07/11/2017 1400   BUN 11 07/11/2017 1400   CREATININE 0.59 07/11/2017 1400   CREATININE 0.59 05/10/2017 0942   CALCIUM 10.3 07/11/2017 1400   PROT 7.3 06/23/2017 0846   ALBUMIN 3.9 06/23/2017 0846   AST 18 06/23/2017 0846   ALT 18 06/23/2017 0846   ALKPHOS 159 (H) 06/23/2017 0846   BILITOT 0.4 06/23/2017 0846   GFRNONAA >60 07/11/2017 1400   GFRAA >60 07/11/2017 1400      RADIOGRAPHY: Nm Sentinel Node Inj-no Rpt (breast)  Result Date: 06/29/2017 Sulfur colloid was injected by the nuclear medicine technologist for melanoma sentinel node.   Mm Breast Surgical Specimen  Result Date: 06/29/2017 CLINICAL DATA:  Specimen radiograph status post left breast lumpectomy. EXAM: SPECIMEN RADIOGRAPH OF THE LEFT BREAST COMPARISON:  Previous exam(s). FINDINGS: Status post excision of the left breast. The radioactive seed and biopsy marker clips are present, completely intact, and were  marked for pathology. These findings were communicated with the OR at 11:22 a.m. IMPRESSION: Specimen radiograph of the left breast. Electronically Signed   By: Ammie Ferrier M.D.   On: 06/29/2017 11:25   Mm Lt Radioactive Seed Loc Mammo Guide  Result Date: 06/29/2017 CLINICAL DATA:  62 year old female presenting for radioactive seed localization of the left breast prior to lumpectomy. EXAM: MAMMOGRAPHIC GUIDED RADIOACTIVE SEED LOCALIZATION OF THE LEFT BREAST COMPARISON:  Previous exam(s). FINDINGS: Patient presents for radioactive seed localization prior to left breast lumpectomy. I met with the patient and we discussed the procedure of seed localization including benefits and alternatives. We discussed the high likelihood of a successful procedure. We discussed the risks of the  procedure including infection, bleeding, tissue injury and further surgery. We discussed the low dose of radioactivity involved in the procedure. Informed, written consent was given. The usual time-out protocol was performed immediately prior to the procedure. Using mammographic guidance, sterile technique, 1% lidocaine and an I-125 radioactive seed, the coil shaped biopsy marking clips in the medial aspect of the left breast were localized using a medial approach. The follow-up mammogram images confirm the seed in the expected location and were marked for Dr. Donne Hazel. Follow-up survey of the patient confirms presence of the radioactive seed. Order number of I-125 seed:  [id number]. Total activity:  8.657 millicuries reference Date: 06/04/2017 The patient tolerated the procedure well and was released from the [facility]. She was given instructions regarding seed removal. IMPRESSION: Radioactive seed localization the medial left breast. No apparent complications. Electronically Signed   By: Ammie Ferrier M.D.   On: 06/29/2017 08:22      IMPRESSION/PLAN: Left Breast DCIS   We discussed adjuvant radiotherapy today.  I recommend radiotherapy to the left breast in order to reduce risk of locoregional recurrence by half.  The risks, benefits and side effects of this treatment were discussed in detail.  She understands that radiotherapy is associated with skin irritation and fatigue in the acute setting. Late effects can include cosmetic changes and rare injury to internal organs.   She is enthusiastic about proceeding with treatment. A consent form has been signed and placed in her chart.  A total of 3 medically necessary complex treatment devices will be fabricated and supervised by me: 2 fields with MLCs for custom blocks to protect heart, and lungs;  and, a Vac-lok. MORE COMPLEX DEVICES MAY BE MADE IN DOSIMETRY FOR FIELD IN FIELD BEAMS FOR DOSE HOMOGENEITY.  I have requested : 3D Simulation which is  medically necessary to give adequate dose to at risk tissues while sparing lungs and heart.  I have requested a DVH of the following structures: lungs, heart, left lumpectomy cavity.    The patient will receive 40.05 Gy in 15 fractions to the left breast with 2 fields.  This will be followed by a boost. Simulation later this month to allow more healing.  I spent at least 25 minutes  face to face with the patient and more than 50% of that time was spent in counseling and/or coordination of care.   __________________________________________   Eppie Gibson, MD  This document serves as a record of services personally performed by Eppie Gibson, MD. It was created on her behalf by Valeta Harms, a trained medical scribe. The creation of this record is based on the scribe's personal observations and the provider's statements to them. This document has been checked and approved by the attending provider.

## 2017-07-21 ENCOUNTER — Encounter: Payer: Self-pay | Admitting: Radiation Oncology

## 2017-07-22 ENCOUNTER — Telehealth: Payer: Self-pay | Admitting: Internal Medicine

## 2017-07-22 NOTE — Telephone Encounter (Signed)
I called her as she was in hospital recently (breast hematoma after lumpectomy)  She is ok re: UC  Relayed that quantiferon was neg  She will schedule a f/u w/ me

## 2017-07-29 ENCOUNTER — Other Ambulatory Visit: Payer: Self-pay

## 2017-07-29 ENCOUNTER — Encounter: Payer: Self-pay | Admitting: Physical Therapy

## 2017-07-29 ENCOUNTER — Ambulatory Visit: Payer: 59 | Attending: General Surgery | Admitting: Physical Therapy

## 2017-07-29 DIAGNOSIS — Z483 Aftercare following surgery for neoplasm: Secondary | ICD-10-CM | POA: Insufficient documentation

## 2017-07-29 DIAGNOSIS — C50312 Malignant neoplasm of lower-inner quadrant of left female breast: Secondary | ICD-10-CM | POA: Diagnosis present

## 2017-07-29 DIAGNOSIS — R293 Abnormal posture: Secondary | ICD-10-CM | POA: Insufficient documentation

## 2017-07-29 DIAGNOSIS — R600 Localized edema: Secondary | ICD-10-CM

## 2017-07-29 DIAGNOSIS — Z170 Estrogen receptor positive status [ER+]: Secondary | ICD-10-CM | POA: Diagnosis present

## 2017-07-29 NOTE — Therapy (Signed)
[place], [place], [address] Phone: [phone]   Fax:  [phone]  Physical Therapy Treatment  Patient Details  Name: Katie Robinson MRN: [id number] Date of Birth: 1955-08-20 Referring Provider: Dr. Rolm Bookbinder   Encounter Date: 07/29/2017  PT End of Session - 07/29/17 0949    Visit Number  2    Number of Visits  2    PT Start Time  0924    PT Stop Time  1012    PT Time Calculation (min)  48 min    Activity Tolerance  Patient tolerated treatment well    Behavior During Therapy  [facility] for tasks assessed/performed       Past Medical History:  Diagnosis Date  . Allergy    SEASONAL  . Anemia   . Arthralgia   . Asthma    controlled  . Cancer ([place]) 05/2017   left breast cancer  . Chronic headache    many years  . Family history of premature coronary artery disease    father  . Former smoker    10 pack year history  . GERD (gastroesophageal reflux disease)    mild, occasional  . H/O bone density study 11/2015   normal  . H/O cardiovascular stress test 2009  . Hx of adenomatous and sessile serrated colonic polyps 05/24/2014  . Iron deficiency 11/29/2015  . Obesity   . Pectus excavatum    mild, left asymmetric  . S/P hysterectomy    due to fibroids  . Seasonal allergic rhinitis   . UC (ulcerative colitis) ([place]) 2015   Dr. Carlean Purl  . Urinary tract infection 2013  . Vitamin D deficiency 07/18/2014  . Wears glasses     Past Surgical History:  Procedure Laterality Date  . BREAST LUMPECTOMY WITH RADIOACTIVE SEED AND SENTINEL LYMPH NODE BIOPSY Left 06/29/2017   Procedure: LEFT BREAST LUMPECTOMY WITH RADIOACTIVE SEED AND LEFT AXILLARY SENTINEL LYMPH NODE BIOPSY ERAS PATHWAY;  Surgeon: Rolm Bookbinder, MD;  Location: [facility];  Service: General;  Laterality: Left;  . COLONOSCOPY  02/2016   polyps; Dr. Silvano Rusk  . EVACUATION BREAST HEMATOMA Left 07/11/2017   Procedure: EVACUATION HEMATOMA BREAST;  Surgeon: Leighton Ruff, MD;  Location[facility];  Service: General;  Laterality: Left;  . FOOT TENDON SURGERY Left 2013  . NAVEL REMOVED     AS A CHILD  . OTHER SURGICAL HISTORY     biopsy for possible sarcoidosis  . POLYPECTOMY    . SKIN BIOPSY    . TONSILLECTOMY    . TOTAL ABDOMINAL HYSTERECTOMY  2002   total; Dr. Molli Posey  . WISDOM TOOTH EXTRACTION      There were no vitals filed for this visit.  Subjective Assessment - 07/29/17 0930    Subjective  Patient reports she underwent a left lumpectomy and sentinel node biopsy (5 negative nodes) on 06/29/17. She had to have a second surgery 07/11/17 for a hematoma evacuation. No need for chemotherapy but will begin radiation 08/04/17.    Pertinent History  Patient was diagnosed with left high grade DCIS breast cancer on 06/07/17. It measured 7 mm and is located in the lower inner quadrant. It is ER positive and PR negative. She also has asthma. She had a left lumpectomy and sentinel node biopsy 06/29/17.    Patient Stated Goals  Make sure my arm is ok    Currently in Pain?  No/denies  [facility] PT Assessment - 07/29/17 0001      Assessment   Medical Diagnosis  s/p left lumpectomy and SLNB    Referring Provider  Dr. Rolm Bookbinder    Onset Date/Surgical Date  06/29/17    Hand Dominance  Right    Prior Therapy  none      Precautions   Precautions  Other (comment)    Precaution Comments  recent cancer surgery; left arm lymphedema risk      Restrictions   Weight Bearing Restrictions  No      Balance Screen   Has the patient fallen in the past 6 months  No    Has the patient had a decrease in activity level because of a fear of falling?   No    Is the patient reluctant to leave their home because of a fear of falling?   No      Home Environment   Living Environment  Private residence    Living Arrangements  Children 50 y.o. 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    Leisure  She is walking 45 minutes 3-4 days per week      Cognition   Overall Cognitive Status  Within Functional Limits for tasks assessed      Posture/Postural Control   Posture/Postural Control  Postural limitations    Postural Limitations  Rounded Shoulders;Forward head      ROM / Strength   AROM / PROM / Strength  AROM      AROM   AROM Assessment Site  Shoulder    Right/Left Shoulder  Left    Left Shoulder Extension  69 Degrees    Left Shoulder Flexion  159 Degrees    Left Shoulder ABduction  164 Degrees    Left Shoulder Internal Rotation  60 Degrees    Left Shoulder External Rotation  89 Degrees      Palpation   Palpation comment  Incision sites appear to be healing well. Mild swelling present in left breast laterally and superiorly.        LYMPHEDEMA/ONCOLOGY QUESTIONNAIRE - 07/29/17 0939      Type   Cancer Type  Left breast      Surgeries   Lumpectomy Date  06/29/17    Sentinel Lymph Node Biopsy Date  06/29/17    Other Surgery Date  07/11/17    Number Lymph Nodes Removed  5      Treatment   Active Chemotherapy Treatment  No    Past Chemotherapy Treatment  No    Active Radiation Treatment  No    Past Radiation Treatment  No    Current Hormone Treatment  No    Past Hormone Therapy  No      What other symptoms do you have   Are you Having Heaviness or Tightness  No    Are you having Pain  No    Are you having pitting edema  No    Is it Hard or Difficult finding clothes that fit  No    Do you have infections  No    Is there Decreased scar mobility  No    Stemmer Sign  No      Lymphedema Assessments   Lymphedema Assessments  Upper extremities  Right Upper Extremity Lymphedema   10 cm Proximal to Olecranon Process  31.8 cm    Olecranon Process  29.8 cm    10 cm Proximal to Ulnar Styloid  Process  23.9 cm    Just Proximal to Ulnar Styloid Process  19 cm    Across Hand at [employer]  21.4 cm    At [place] of 2nd Digit  6.9 cm      Left Upper Extremity Lymphedema   10 cm Proximal to Olecranon Process  30 cm    Olecranon Process  28.3 cm    10 cm Proximal to Ulnar Styloid Process  22.8 cm    Just Proximal to Ulnar Styloid Process  18.7 cm    Across Hand at [employer]  21.3 cm    At [place] of 2nd Digit  6.7 cm        Quick Dash - 07/29/17 0001    Open a tight or new jar  No difficulty    Do heavy household chores (wash walls, wash floors)  No difficulty    Carry a shopping bag or briefcase  No difficulty    Wash your back  No difficulty    Use a knife to cut food  No difficulty    Recreational activities in which you take some force or impact through your arm, shoulder, or hand (golf, hammering, tennis)  No difficulty    During the past week, to what extent has your arm, shoulder or hand problem interfered with your normal social activities with family, friends, neighbors, or groups?  Not at all    During the past week, to what extent has your arm, shoulder or hand problem limited your work or other regular daily activities  Not at all    Arm, shoulder, or hand pain.  Mild    Tingling (pins and needles) in your arm, shoulder, or hand  None    Difficulty Sleeping  Mild difficulty    DASH Score  4.55 %            [facility] Adult PT Treatment/Exercise - 07/29/17 0001      Manual Therapy   Manual Therapy  Manual Lymphatic Drainage (MLD)    Manual Lymphatic Drainage (MLD)  MLD to left superior and lateral breast in supine: short neck, anterior inter-axillary pathway; left lateral and superior breast redirecting along pathway to reduce swelling present from most recent surgery.             PT Education - 07/29/17 0948    Education provided  Yes    Education Details  Lymphedema risk reduction education    Person(s) Educated  Patient    Methods  Explanation     Comprehension  Verbalized understanding          PT Long Term Goals - 07/29/17 1019      PT LONG TERM GOAL #1   Title  Patient will demonstrate she has regained function and range of motion since surgery.    Time  8    Period  Weeks    Status  Achieved      Breast Clinic Goals - 06/23/17 1231      Patient will be able to verbalize understanding of pertinent lymphedema risk reduction practices relevant to her diagnosis specifically related to skin care.   Time  1    Period  Days    Status  Achieved      Patient will be able to  return demonstrate and/or verbalize understanding of the post-op home exercise program related to regaining shoulder range of motion.   Time  1    Period  Days    Status  Achieved      Patient will be able to verbalize understanding of the importance of attending the postoperative After Breast Cancer Class for further lymphedema risk reduction education and therapeutic exercise.   Time  1    Period  Days    Status  Achieved           Plan - 07/29/17 1016    Clinical Impression Statement  Patient is healing well s/p left lumpectomy and sentinel node biopsy. She underwent a hematoma evacuation on 8/45/36 due to a complication and continues to heal from that with some persistent edema present. Shoulder function and ROM are back to baseline and there is no sign of lymphedema in her left arm. She was educated on performing scar massage to loosen skin around axillary incision. There is no other need for PT at this time but she was encouraged to contact us if post surgical breast edema does not resolve.    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 Hematoma of breast 07/11/2017  . 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 Ductal carcinoma in situ (DCIS) of left breast 06/18/2017  . Need for influenza vaccination 05/10/2017  . Screening for breast cancer 05/10/2017  . Ulcerative colitis with complication ([place]) 46/80/3212  . Mild persistent asthma without complication 24/82/5003  . Allergic rhinitis due to pollen 05/10/2017  . Right hand paresthesia 05/10/2017  . Normocytic anemia   . Iron deficiency 11/29/2015  . Vitamin D deficiency 07/18/2014  . Hx of adenomatous and sessile serrated colonic polyps 05/24/2014   PHYSICAL THERAPY DISCHARGE SUMMARY  Visits from Start of Care: 2  Current functional level related to goals / functional outcomes: See above. No deficits except post surgical breast edema.   Remaining deficits: None except post surgical breast edema.  Education / Equipment: Lymphedema risk reduction. Will attend ABC class.  Plan: Patient agrees to discharge.  Patient goals were met. Patient is being discharged due to meeting the stated rehab goals.  ?????    Annia Friendly, [place] 07/29/17 10:21 AM  [place], [place], [address] Phone: [phone]   Fax:  [phone]  Name: LARKIN ALFRED MRN: [id number] Date of Birth: 07-Jul-1955

## 2017-08-04 ENCOUNTER — Ambulatory Visit
Admission: RE | Admit: 2017-08-04 | Discharge: 2017-08-04 | Disposition: A | Discharge status: Home / Self Care | Payer: 59 | Source: Ambulatory Visit | Attending: Radiation Oncology | Admitting: Radiation Oncology

## 2017-08-04 DIAGNOSIS — Z170 Estrogen receptor positive status [ER+]: Secondary | ICD-10-CM | POA: Insufficient documentation

## 2017-08-04 DIAGNOSIS — Z510 Encounter for antineoplastic radiation therapy: Secondary | ICD-10-CM | POA: Insufficient documentation

## 2017-08-04 DIAGNOSIS — C50312 Malignant neoplasm of lower-inner quadrant of left female breast: Secondary | ICD-10-CM | POA: Insufficient documentation

## 2017-08-04 DIAGNOSIS — D0512 Intraductal carcinoma in situ of left breast: Secondary | ICD-10-CM

## 2017-08-04 NOTE — Progress Notes (Signed)
  Radiation Oncology         [phone]) [phone] ________________________________  Name: Katie Robinson MRN: [id number]  Date: 08/04/2017  DOB: [birth year]  SIMULATION AND TREATMENT PLANNING NOTE    Outpatient  DIAGNOSIS:     ICD-10-CM   1. 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 Ductal carcinoma in situ (DCIS) of left breast D05.12     NARRATIVE:  The patient was brought to the [facility].  Identity was confirmed.  All relevant records and images related to the planned course of therapy were reviewed.  The patient freely provided informed written consent to proceed with treatment after reviewing the details related to the planned course of therapy. The consent form was witnessed and verified by the simulation staff.    Then, the patient was set-up in a stable reproducible supine position for radiation therapy with her ipsilateral arm over her head, and her upper body secured in a custom-made Vac-lok device.  CT images were obtained.  Surface markings were placed.  The CT images were loaded into the planning software.    TREATMENT PLANNING NOTE: Treatment planning then occurred.  The radiation prescription was entered and confirmed.     A total of 3 medically necessary complex treatment devices were fabricated and supervised by me: 2 fields with MLCs for custom blocks to protect heart, and lungs;  and, a Vac-lok. MORE COMPLEX DEVICES MAY BE MADE IN DOSIMETRY FOR FIELD IN FIELD BEAMS FOR DOSE HOMOGENEITY.  I have requested : 3D Simulation which is medically necessary to give adequate dose to at risk tissues while sparing lungs and heart.  I have requested a DVH of the following structures: lungs, heart, lumpectomy cavity.    The patient will receive 40.05 Gy in 15 fractions to the left breast with 2 tangential fields.   This will be followed by a boost.  Optical Surface Tracking Plan:  Since intensity  modulated radiotherapy (IMRT) and 3D conformal radiation treatment methods are predicated on accurate and precise positioning for treatment, intrafraction motion monitoring is medically necessary to ensure accurate and safe treatment delivery. The ability to quantify intrafraction motion without excessive ionizing radiation dose can only be performed with optical surface tracking. Accordingly, surface imaging offers the opportunity to obtain 3D measurements of patient position throughout IMRT and 3D treatments without excessive radiation exposure. I am ordering optical surface tracking for this patient's upcoming course of radiotherapy.  ________________________________   Reference:  Ursula Alert, J, et al. Surface imaging-based analysis of intrafraction motion for breast radiotherapy patients.Journal of [place], n. 6, nov. 2014. ISSN [id number].  Available at: <http://www.jacmp.org/index.php/jacmp/article/view/4957>.    -----------------------------------  Eppie Gibson, MD

## 2017-08-04 NOTE — Addendum Note (Signed)
Encounter addended by: Eppie Gibson, MD on: 08/04/2017 12:56 PM  Actions taken: Sign clinical note

## 2017-08-09 DIAGNOSIS — Z510 Encounter for antineoplastic radiation therapy: Secondary | ICD-10-CM | POA: Diagnosis not present

## 2017-08-11 ENCOUNTER — Ambulatory Visit
Admission: RE | Admit: 2017-08-11 | Discharge: 2017-08-11 | Disposition: A | Discharge status: Home / Self Care | Payer: 59 | Source: Ambulatory Visit | Attending: Radiation Oncology | Admitting: Radiation Oncology

## 2017-08-11 DIAGNOSIS — Z510 Encounter for antineoplastic radiation therapy: Secondary | ICD-10-CM | POA: Diagnosis not present

## 2017-08-12 ENCOUNTER — Ambulatory Visit: Payer: 59

## 2017-08-12 ENCOUNTER — Ambulatory Visit
Admission: RE | Admit: 2017-08-12 | Discharge: 2017-08-12 | Disposition: A | Discharge status: Home / Self Care | Payer: 59 | Source: Ambulatory Visit | Attending: Radiation Oncology | Admitting: Radiation Oncology

## 2017-08-12 DIAGNOSIS — C50312 Malignant neoplasm of lower-inner quadrant of left female breast: Secondary | ICD-10-CM

## 2017-08-12 DIAGNOSIS — Z510 Encounter for antineoplastic radiation therapy: Secondary | ICD-10-CM | POA: Diagnosis not present

## 2017-08-12 DIAGNOSIS — Z170 Estrogen receptor positive status [ER+]: Principal | ICD-10-CM

## 2017-08-12 MED ORDER — ALRA NON-METALLIC DEODORANT (RAD-ONC)
1.0000 "application " | Freq: Once | TOPICAL | Status: AC
  Administered 2017-08-12: 1 via TOPICAL

## 2017-08-12 MED ORDER — RADIAPLEXRX EX GEL
Freq: Once | CUTANEOUS | Status: AC
  Administered 2017-08-12: 15:00:00 via TOPICAL

## 2017-08-12 NOTE — Progress Notes (Signed)
Pt here for patient teaching.  Pt given Radiation and You booklet, skin care instructions, Alra deodorant and Radiaplex gel.  Reviewed areas of pertinence such as fatigue, skin changes, breast tenderness and breast swelling . Pt able to give teach back of to pat skin, use unscented/gentle soap and drink plenty of water,apply Radiaplex bid, avoid applying anything to skin within 4 hours of treatment, avoid wearing an under wire bra and to use an electric razor if they must shave. Pt verbalizes understanding of information given and will contact nursing with any questions or concerns.     Http://rtanswers.org/treatmentinformation/whattoexpect/index

## 2017-08-13 ENCOUNTER — Ambulatory Visit
Admission: RE | Admit: 2017-08-13 | Discharge: 2017-08-13 | Disposition: A | Discharge status: Home / Self Care | Payer: POS | Source: Ambulatory Visit | Attending: Radiation Oncology | Admitting: Radiation Oncology

## 2017-08-13 DIAGNOSIS — Z170 Estrogen receptor positive status [ER+]: Secondary | ICD-10-CM | POA: Diagnosis not present

## 2017-08-13 DIAGNOSIS — Z510 Encounter for antineoplastic radiation therapy: Secondary | ICD-10-CM | POA: Insufficient documentation

## 2017-08-13 DIAGNOSIS — C50312 Malignant neoplasm of lower-inner quadrant of left female breast: Secondary | ICD-10-CM | POA: Diagnosis not present

## 2017-08-16 ENCOUNTER — Ambulatory Visit
Admission: RE | Admit: 2017-08-16 | Discharge: 2017-08-16 | Disposition: A | Discharge status: Home / Self Care | Payer: POS | Source: Ambulatory Visit | Attending: Radiation Oncology | Admitting: Radiation Oncology

## 2017-08-16 DIAGNOSIS — Z510 Encounter for antineoplastic radiation therapy: Secondary | ICD-10-CM | POA: Diagnosis not present

## 2017-08-17 ENCOUNTER — Ambulatory Visit
Admission: RE | Admit: 2017-08-17 | Discharge: 2017-08-17 | Disposition: A | Discharge status: Home / Self Care | Payer: POS | Source: Ambulatory Visit | Attending: Radiation Oncology | Admitting: Radiation Oncology

## 2017-08-17 DIAGNOSIS — Z510 Encounter for antineoplastic radiation therapy: Secondary | ICD-10-CM | POA: Diagnosis not present

## 2017-08-18 ENCOUNTER — Ambulatory Visit
Admission: RE | Admit: 2017-08-18 | Discharge: 2017-08-18 | Disposition: A | Discharge status: Home / Self Care | Payer: POS | Source: Ambulatory Visit | Attending: Radiation Oncology | Admitting: Radiation Oncology

## 2017-08-18 DIAGNOSIS — Z510 Encounter for antineoplastic radiation therapy: Secondary | ICD-10-CM | POA: Diagnosis not present

## 2017-08-19 ENCOUNTER — Ambulatory Visit
Admission: RE | Admit: 2017-08-19 | Discharge: 2017-08-19 | Disposition: A | Discharge status: Home / Self Care | Payer: POS | Source: Ambulatory Visit | Attending: Radiation Oncology | Admitting: Radiation Oncology

## 2017-08-19 DIAGNOSIS — Z510 Encounter for antineoplastic radiation therapy: Secondary | ICD-10-CM | POA: Diagnosis not present

## 2017-08-20 ENCOUNTER — Ambulatory Visit
Admission: RE | Admit: 2017-08-20 | Discharge: 2017-08-20 | Disposition: A | Discharge status: Home / Self Care | Payer: POS | Source: Ambulatory Visit | Attending: Radiation Oncology | Admitting: Radiation Oncology

## 2017-08-20 DIAGNOSIS — Z510 Encounter for antineoplastic radiation therapy: Secondary | ICD-10-CM | POA: Diagnosis not present

## 2017-08-23 ENCOUNTER — Ambulatory Visit
Admission: RE | Admit: 2017-08-23 | Discharge: 2017-08-23 | Disposition: A | Discharge status: Home / Self Care | Payer: POS | Source: Ambulatory Visit | Attending: Radiation Oncology | Admitting: Radiation Oncology

## 2017-08-23 DIAGNOSIS — Z510 Encounter for antineoplastic radiation therapy: Secondary | ICD-10-CM | POA: Diagnosis not present

## 2017-08-24 ENCOUNTER — Ambulatory Visit
Admission: RE | Admit: 2017-08-24 | Discharge: 2017-08-24 | Disposition: A | Discharge status: Home / Self Care | Payer: POS | Source: Ambulatory Visit | Attending: Radiation Oncology | Admitting: Radiation Oncology

## 2017-08-24 DIAGNOSIS — Z510 Encounter for antineoplastic radiation therapy: Secondary | ICD-10-CM | POA: Diagnosis not present

## 2017-08-25 ENCOUNTER — Ambulatory Visit
Admission: RE | Admit: 2017-08-25 | Discharge: 2017-08-25 | Disposition: A | Discharge status: Home / Self Care | Payer: POS | Source: Ambulatory Visit | Attending: Radiation Oncology | Admitting: Radiation Oncology

## 2017-08-25 DIAGNOSIS — Z510 Encounter for antineoplastic radiation therapy: Secondary | ICD-10-CM | POA: Diagnosis not present

## 2017-08-26 ENCOUNTER — Ambulatory Visit
Admission: RE | Admit: 2017-08-26 | Discharge: 2017-08-26 | Disposition: A | Discharge status: Home / Self Care | Payer: POS | Source: Ambulatory Visit | Attending: Radiation Oncology | Admitting: Radiation Oncology

## 2017-08-26 DIAGNOSIS — Z510 Encounter for antineoplastic radiation therapy: Secondary | ICD-10-CM | POA: Diagnosis not present

## 2017-08-26 NOTE — Progress Notes (Signed)
[facility]  Telephone:(336) [phone] Fax:(336) [phone]     ID: Katie Robinson DOB: 07/24/1955  MR#: [id number]  XBD#:[id number]  Patient Care Team: Caryl Ada as PCP - General (Family Medicine) Magrinat, Virgie Dad, MD as Consulting Physician (Oncology) Gatha Mayer, MD as Consulting Physician (Gastroenterology) OTHER MD:  CHIEF COMPLAINT: DCIS estrogen receptor positive  CURRENT TREATMENT: adjuvant radiation; to start anastrozole   HISTORY OF CURRENT ILLNESS: Katie Robinson had routine screening mammography on 06/07/2017 showing a possible abnormality in the left breast. She underwent unilateral left breast diagnostic mammography with tomography and left axilla ultrasonography on 06/17/2017 at [facility] on 06/10/2017 showing: Suspicious, grouped left breast calcifications. Negative for left axillary lymphadenopathy.  Accordingly on 06/14/2017 she proceeded to biopsy of the left breast area in question. The pathology from this procedure showed ([id number]): High grade ductal carcinoma in situ with calcifications in the left breast lower inner quadrant. Focus Highly suspicious for stromal invasion. Prognostic indicators significant for: estrogen receptor, 50% positive with moderate staining intensity and progesterone receptor, 0% negative.  The patient's subsequent history is as detailed below.  INTERVAL HISTORY: Katie Robinson retuens today for follow up and treatment of her estrogen receptor positive breast cancer, accompanied by her sister. She is undergoing radiation treatments. She reports that radiation is going well. She doesn't feel burning or discomfort in the affected area. She has noticed some itching and she uses the radiaplex creme. She has maintained her energy levels throughout radiation.  Since her last visit she underwent left lumpectomy with sentinel lymph node sampling ([id number])ER 06/29/2017 with pathology showing: no residual carcinoma  with clear margins. Fibrocystic changes. 5 sentinel lymph nodes are negative for carcinoma (0/5).    REVIEW OF SYSTEMS: Katie Robinson says that she woke up with a sore throat on 08/09/2017. She still has a productive cough with occasional clear phlegm. She says that the cough is strong sounding. She denies unusual headaches, visual changes, nausea, vomiting, or dizziness. There has been no unusual cough, phlegm production, or pleurisy. This been no change in bowel or bladder habits. She denies unexplained fatigue or unexplained weight loss, bleeding, rash, or fever. A detailed review of systems was otherwise stable.    PAST MEDICAL HISTORY: Past Medical History:  Diagnosis Date  . Allergy    SEASONAL  . Anemia   . Arthralgia   . Asthma    controlled  . Cancer ([place]) 05/2017   left breast cancer  . Chronic headache    many years  . Family history of premature coronary artery disease    father  . Former smoker    10 pack year history  . GERD (gastroesophageal reflux disease)    mild, occasional  . H/O bone density study 11/2015   normal  . H/O cardiovascular stress test 2009  . Hx of adenomatous and sessile serrated colonic polyps 05/24/2014  . Iron deficiency 11/29/2015  . Obesity   . Pectus excavatum    mild, left asymmetric  . S/P hysterectomy    due to fibroids  . Seasonal allergic rhinitis   . UC (ulcerative colitis) ([place]) 2015   Dr. Carlean Purl  . Urinary tract infection 2013  . Vitamin D deficiency 07/18/2014  . Wears glasses     PAST SURGICAL HISTORY: Past Surgical History:  Procedure Laterality Date  . BREAST LUMPECTOMY WITH RADIOACTIVE SEED AND SENTINEL LYMPH NODE BIOPSY Left 06/29/2017   Procedure: LEFT BREAST LUMPECTOMY WITH RADIOACTIVE SEED AND LEFT AXILLARY SENTINEL LYMPH  NODE BIOPSY ERAS PATHWAY;  Surgeon: Rolm Bookbinder, MD;  Location: [facility];  Service: General;  Laterality: Left;  . COLONOSCOPY  02/2016   polyps; Dr. Silvano Rusk  . EVACUATION  BREAST HEMATOMA Left 07/11/2017   Procedure: EVACUATION HEMATOMA BREAST;  Surgeon: Leighton Ruff, MD;  Location: [facility];  Service: General;  Laterality: Left;  . FOOT TENDON SURGERY Left 08-11-2011  . NAVEL REMOVED     AS A CHILD  . OTHER SURGICAL HISTORY     biopsy for possible sarcoidosis  . POLYPECTOMY    . SKIN BIOPSY    . TONSILLECTOMY    . TOTAL ABDOMINAL HYSTERECTOMY  Aug 10, 2000   total; Dr. Molli Posey  . WISDOM TOOTH EXTRACTION      FAMILY HISTORY Family History  Problem Relation Age of Onset  . Asthma Father   . Heart disease Father 75       died of MI mid 08-10-2022  . Hypertension Sister   . Asthma Sister   . Hypertension Mother   . Aneurysm Mother        brain  . Alzheimer's disease Mother   . Heart disease Mother   . Cancer Neg Hx   . Stroke Neg Hx   . Diabetes Neg Hx   . Colon cancer Neg Hx   . Colon polyps Neg Hx   . Crohn's disease Neg Hx   . Ulcerative colitis Neg Hx   Mother passed at due to brain aneurysm and she also had Alzhimer's. Father passed at 55 due to heart attack and asthma. She has 1 living sister with good health. No history with breast or ovarian cancer in the family.  GYNECOLOGIC HISTORY:  No LMP recorded. Patient has had a hysterectomy. Menarche: 62 years old Age at first live birth: 62 years old GP: GxP2 HRT: Did not use hormone replacement She has a total hysterectomy with bilateral salpingo-oophorectomy in 08-10-00 due to fibroids and a chocolate cyst on her ovaries.   SOCIAL HISTORY:  She works as a Scientist, research (medical). She is divorced. Her son lives with her and is unmarried. He works at [employer] as a Technical brewer. Her daughter is a Chiropractor at [employer]. She has 1 grandchild. She belongs to [employer].    ADVANCED DIRECTIVES: Not in place; at the 06/23/2017 visit the patient was given the appropriate documents to complete on notarized at her discretion   HEALTH MAINTENANCE: Social History   Tobacco Use   . Smoking status: Former Smoker    Types: Cigarettes    Last attempt to quit: 06/15/1988    Years since quitting: 29.2  . Smokeless tobacco: Never Used  Substance Use Topics  . Alcohol use: Yes    Alcohol/week: 0.0 oz    Comment: rarely  . Drug use: No     Colonoscopy: 02/2016/ [facility]  PAP: Status post Hysterectomy/ BSO 08/10/00  Bone density: 12/02/2015 T-score +0.2 normal   No Known Allergies  Current Outpatient Medications  Medication Sig Dispense Refill  . Adalimumab (HUMIRA) 40 MG/0.8ML PSKT Inject 1 each into the skin every 14 (fourteen) days.     Marland Kitchen albuterol (PROVENTIL HFA;VENTOLIN HFA) 108 (90 Base) MCG/ACT inhaler Inhale 2 puffs into the lungs every 6 (six) hours as needed for wheezing or shortness of breath. 18 g 1  . Cholecalciferol (VITAMIN D3) 1000 units CAPS Take 2 capsules by mouth daily.     Marland Kitchen triamcinolone (NASACORT) 55 MCG/ACT AERO nasal inhaler Place 2  sprays into the nose as needed (congestion).     No current facility-administered medications for this visit.     OBJECTIVE: Middle-aged African-American woman in no acute distress Vitals:   08/31/17 1355  BP: 139/90  Pulse: 78  Resp: 18  Temp: 98 F (36.7 C)  SpO2: 100%     Body mass index is 33.13 kg/m.   Wt Readings from Last 3 Encounters:  08/31/17 244 lb 4.8 oz (110.8 kg)  07/20/17 245 lb 9.6 oz (111.4 kg)  07/11/17 238 lb (108 kg)      ECOG FS:0 - Asymptomatic  Sclerae unicteric, EOMs intact Oropharynx clear and moist No cervical or supraclavicular adenopathy Lungs no rales or rhonchi Heart regular rate and rhythm Abd soft, nontender, positive bowel sounds MSK no focal spinal tenderness, no upper extremity lymphedema Neuro: nonfocal, well oriented, appropriate affect Breasts: Right breast is unremarkable.  The left breast is currently receiving radiation.  The cosmetic result is good.  There is some swelling, but no erythema.  There is minimal hyperpigmentation.  There is no desquamation.   Both axillae are benign.  LAB RESULTS:  CMP     Component Value Date/Time   NA 140 07/11/2017 1400   K 3.9 07/11/2017 1400   CL 107 07/11/2017 1400   CO2 26 07/11/2017 1400   GLUCOSE 88 07/11/2017 1400   BUN 11 07/11/2017 1400   CREATININE 0.59 07/11/2017 1400   CREATININE 0.59 05/10/2017 0942   CALCIUM 10.3 07/11/2017 1400   PROT 7.3 06/23/2017 0846   ALBUMIN 3.9 06/23/2017 0846   AST 18 06/23/2017 0846   ALT 18 06/23/2017 0846   ALKPHOS 159 (H) 06/23/2017 0846   BILITOT 0.4 06/23/2017 0846   GFRNONAA >60 07/11/2017 1400   GFRAA >60 07/11/2017 1400    No results found for: TOTALPROTELP, ALBUMINELP, A1GS, A2GS, BETS, BETA2SER, GAMS, MSPIKE, SPEI  No results found for: KPAFRELGTCHN, LAMBDASER, KAPLAMBRATIO  Lab Results  Component Value Date   WBC 14.8 (H) 07/11/2017   NEUTROABS 8.8 (H) 07/11/2017   HGB 13.7 07/11/2017   HCT 40.8 07/11/2017   MCV 85.4 07/11/2017   PLT 277 07/11/2017    @LASTCHEMISTRY @  No results found for: LABCA2  No components found for: [id number]  No results for input(s): INR in the last 168 hours.  No results found for: LABCA2  No results found for: JTT017  No results found for: BLT903  No results found for: ESP233  No results found for: CA2729  No components found for: HGQUANT  No results found for: CEA1 / No results found for: CEA1   No results found for: AFPTUMOR  No results found for: CHROMOGRNA  No results found for: PSA1  No visits with results within 3 Day(s) from this visit.  Latest known visit with results is:  Admission on 07/11/2017, Discharged on 07/12/2017  Component Date Value Ref Range Status  . WBC 07/11/2017 14.8* 4.0 - 10.5 K/uL Final  . RBC 07/11/2017 4.78  3.87 - 5.11 MIL/uL Final  . Hemoglobin 07/11/2017 13.7  12.0 - 15.0 g/dL Final  . HCT 07/11/2017 40.8  36.0 - 46.0 % Final  . MCV 07/11/2017 85.4  78.0 - 100.0 fL Final  . MCH 07/11/2017 28.7  26.0 - 34.0 pg Final  . MCHC 07/11/2017 33.6  30.0 -  36.0 g/dL Final  . RDW 07/11/2017 13.1  11.5 - 15.5 % Final  . Platelets 07/11/2017 277  150 - 400 K/uL Final  . Neutrophils Relative % 07/11/2017 59  %  Final  . Neutro Abs 07/11/2017 8.8* 1.7 - 7.7 K/uL Final  . Lymphocytes Relative 07/11/2017 32  % Final  . Lymphs Abs 07/11/2017 4.8* 0.7 - 4.0 K/uL Final  . Monocytes Relative 07/11/2017 7  % Final  . Monocytes Absolute 07/11/2017 1.0  0.1 - 1.0 K/uL Final  . Eosinophils Relative 07/11/2017 2  % Final  . Eosinophils Absolute 07/11/2017 0.3  0.0 - 0.7 K/uL Final  . Basophils Relative 07/11/2017 0  % Final  . Basophils Absolute 07/11/2017 0.0  0.0 - 0.1 K/uL Final  . Sodium 07/11/2017 140  135 - 145 mmol/L Final  . Potassium 07/11/2017 3.9  3.5 - 5.1 mmol/L Final  . Chloride 07/11/2017 107  101 - 111 mmol/L Final  . CO2 07/11/2017 26  22 - 32 mmol/L Final  . Glucose, Bld 07/11/2017 88  65 - 99 mg/dL Final  . BUN 07/11/2017 11  6 - 20 mg/dL Final  . Creatinine, Ser 07/11/2017 0.59  0.44 - 1.00 mg/dL Final  . Calcium 07/11/2017 10.3  8.9 - 10.3 mg/dL Final  . GFR calc non Af Amer 07/11/2017 >60  >60 mL/min Final  . GFR calc Af Amer 07/11/2017 >60  >60 mL/min Final   Comment: (NOTE) The eGFR has been calculated using the CKD EPI equation. This calculation has not been validated in all clinical situations. eGFR's persistently <60 mL/min signify possible Chronic Kidney Disease.   . Anion gap 07/11/2017 7  5 - 15 Final  . ABO/RH(D) 07/11/2017 B POS   Final  . Antibody Screen 07/11/2017 NEG   Final  . Sample Expiration 07/11/2017 07/14/2017   Final  . ABO/RH(D) 07/11/2017 B POS   Final    (this displays the last labs from the last 3 days)  No results found for: TOTALPROTELP, ALBUMINELP, A1GS, A2GS, BETS, BETA2SER, GAMS, MSPIKE, SPEI (this displays SPEP labs)  No results found for: KPAFRELGTCHN, LAMBDASER, KAPLAMBRATIO (kappa/lambda light chains)  No results found for: HGBA, HGBA2QUANT, HGBFQUANT, HGBSQUAN (Hemoglobinopathy  evaluation)   No results found for: LDH  Lab Results  Component Value Date   IRON 45 04/05/2016   TIBC 141 (L) 04/05/2016   IRONPCTSAT 32 (H) 04/05/2016   (Iron and TIBC)  Lab Results  Component Value Date   FERRITIN 68 04/05/2016    Urinalysis    Component Value Date/Time   COLORURINE YELLOW 04/04/2016 1552   APPEARANCEUR HAZY (A) 04/04/2016 1552   LABSPEC 1.020 05/10/2017 0912   PHURINE 7.0 04/04/2016 1552   GLUCOSEU NEGATIVE 04/04/2016 [place] 04/04/2016 1552   BILIRUBINUR negative 05/10/2017 0912   BILIRUBINUR n 04/29/2015 1218   KETONESUR negative 05/10/2017 0912   KETONESUR NEGATIVE 04/04/2016 1552   PROTEINUR negative 05/10/2017 0912   PROTEINUR NEGATIVE 04/04/2016 1552   UROBILINOGEN 0.2 04/29/2015 1218   UROBILINOGEN 1.0 04/24/2015 2029   NITRITE Negative 05/10/2017 0912   NITRITE NEGATIVE 04/04/2016 1552   LEUKOCYTESUR Trace (A) 05/10/2017 0912     STUDIES: No results found.  ELIGIBLE FOR AVAILABLE RESEARCH PROTOCOL: No  ASSESSMENT: 62 y.o. [place] woman status post left breast lower inner quadrant biopsy 06/14/2017 for ductal carcinoma in situ, grade 3, with an area suspicious for microinvasion, estrogen receptor positive progesterone receptor negative  (1) status post left lumpectomy and sentinel lymph node sampling on 06/29/2017 for  pT0 pN0 results  (a )she required additional surgery for evacuation of a hematoma 07/11/2017.  (2) adjuvant radiation to be completed 09/08/2017.  (3) to start anastrozole 10/04/2017  PLAN: Katie Robinson is doing terrific with radiation and will be completing it next week.  We discussed the nature of her tumor which is largely noninvasive.  This is largely cured with local treatment, namely surgery and radiation, and the benefit of adding systemic therapy in the form of antiestrogens is minimal.  However she is at risk for developing another breast cancer in either breast.  If she takes antiestrogens for 5  years she will cut that risk in half.  Accordingly we discussed the difference between tamoxifen and anastrozole in detail. She understands that anastrozole and the aromatase inhibitors in general work by blocking estrogen production. Accordingly vaginal dryness, decrease in bone density, and of course hot flashes can result. The aromatase inhibitors can also negatively affect the cholesterol profile, although that is a minor effect. One out of 5 women on aromatase inhibitors we will feel "old and achy". This arthralgia/myalgia syndrome, which resembles fibromyalgia clinically, does resolve with stopping the medications. Accordingly this is not a reason to not try an aromatase inhibitor but it is a frequent reason to stop it (in other words 20% of women will not be able to tolerate these medications).  Tamoxifen on the other hand does not block estrogen production. It does not "take away a woman's estrogen". It blocks the estrogen receptor in breast cells. Like anastrozole, it can also cause hot flashes. As opposed to anastrozole, tamoxifen has many estrogen-like effects. It is technically an estrogen receptor modulator. This means that in some tissues tamoxifen works like estrogen-- for example it helps strengthen the bones. It tends to improve the cholesterol profile. It can cause thickening of the endometrial lining, and even endometrial polyps or rarely cancer of the uterus.(The risk of uterine cancer due to tamoxifen is one additional cancer per thousand women year). It can cause vaginal wetness or stickiness. It can cause blood clots through this estrogen-like effect--the risk of blood clots with tamoxifen is exactly the same as with birth control pills or hormone replacement.  Neither of these agents causes mood changes or weight gain, despite the popular belief that they can have these side effects. We have data from studies comparing either of these drugs with placebo, and in those cases the control  group had the same amount of weight gain and depression as the group that took the drug.  After loss this she is interested in giving anastrozole a try.  I have put in the prescription through [facility] at her request.  We are going to wait until right after Easter for her to start that she can recover from her radiation.  That means she will be starting late April.  She will see me again in June.  If she is tolerating the anastrozole well the plan will be to continue that for a total of 5 years  She knows to call for any other issues that may develop before the next visit.    Magrinat, Virgie Dad, MD  08/31/17 2:15 PM Medical Oncology and Hematology [facility] [address] [place], [place] [address] Tel. [phone]    Fax. [phone]  This document serves as a record of services personally performed by Lurline Del, MD. It was created on his behalf by Sheron Nightingale, a trained medical scribe. The creation of this record is based on the scribe's personal observations and the provider's statements to them.    I have reviewed the above documentation for accuracy and completeness, and I agree with the above.

## 2017-08-27 ENCOUNTER — Ambulatory Visit
Admission: RE | Admit: 2017-08-27 | Discharge: 2017-08-27 | Disposition: A | Discharge status: Home / Self Care | Payer: POS | Source: Ambulatory Visit | Attending: Radiation Oncology | Admitting: Radiation Oncology

## 2017-08-27 DIAGNOSIS — Z510 Encounter for antineoplastic radiation therapy: Secondary | ICD-10-CM | POA: Diagnosis not present

## 2017-08-30 ENCOUNTER — Ambulatory Visit
Admission: RE | Admit: 2017-08-30 | Discharge: 2017-08-30 | Disposition: A | Discharge status: Home / Self Care | Payer: POS | Source: Ambulatory Visit | Attending: Radiation Oncology | Admitting: Radiation Oncology

## 2017-08-30 DIAGNOSIS — Z510 Encounter for antineoplastic radiation therapy: Secondary | ICD-10-CM | POA: Diagnosis not present

## 2017-08-31 ENCOUNTER — Ambulatory Visit
Admission: RE | Admit: 2017-08-31 | Discharge: 2017-08-31 | Disposition: A | Discharge status: Home / Self Care | Payer: POS | Source: Ambulatory Visit | Attending: Radiation Oncology | Admitting: Radiation Oncology

## 2017-08-31 ENCOUNTER — Inpatient Hospital Stay: Payer: POS | Attending: Oncology | Admitting: Oncology

## 2017-08-31 VITALS — BP 139/90 | HR 78 | Temp 98.0°F | Resp 18 | Ht 72.0 in | Wt 244.3 lb

## 2017-08-31 DIAGNOSIS — C50312 Malignant neoplasm of lower-inner quadrant of left female breast: Secondary | ICD-10-CM

## 2017-08-31 DIAGNOSIS — D0512 Intraductal carcinoma in situ of left breast: Secondary | ICD-10-CM | POA: Insufficient documentation

## 2017-08-31 DIAGNOSIS — Z170 Estrogen receptor positive status [ER+]: Secondary | ICD-10-CM | POA: Diagnosis not present

## 2017-08-31 DIAGNOSIS — Z510 Encounter for antineoplastic radiation therapy: Secondary | ICD-10-CM | POA: Diagnosis not present

## 2017-08-31 MED ORDER — ANASTROZOLE 1 MG PO TABS: 1.0000 mg | ORAL_TABLET | Freq: Every day | ORAL | 4 refills | Status: DC

## 2017-09-01 ENCOUNTER — Ambulatory Visit
Admission: RE | Admit: 2017-09-01 | Discharge: 2017-09-01 | Disposition: A | Discharge status: Home / Self Care | Payer: POS | Source: Ambulatory Visit | Attending: Radiation Oncology | Admitting: Radiation Oncology

## 2017-09-01 DIAGNOSIS — Z510 Encounter for antineoplastic radiation therapy: Secondary | ICD-10-CM | POA: Diagnosis not present

## 2017-09-02 ENCOUNTER — Ambulatory Visit
Admission: RE | Admit: 2017-09-02 | Discharge: 2017-09-02 | Disposition: A | Discharge status: Home / Self Care | Payer: POS | Source: Ambulatory Visit | Attending: Radiation Oncology | Admitting: Radiation Oncology

## 2017-09-02 DIAGNOSIS — Z510 Encounter for antineoplastic radiation therapy: Secondary | ICD-10-CM | POA: Diagnosis not present

## 2017-09-03 ENCOUNTER — Ambulatory Visit
Admission: RE | Admit: 2017-09-03 | Discharge: 2017-09-03 | Disposition: A | Discharge status: Home / Self Care | Payer: POS | Source: Ambulatory Visit | Attending: Radiation Oncology | Admitting: Radiation Oncology

## 2017-09-03 DIAGNOSIS — Z510 Encounter for antineoplastic radiation therapy: Secondary | ICD-10-CM | POA: Diagnosis not present

## 2017-09-06 ENCOUNTER — Ambulatory Visit: Payer: 59 | Admitting: Family Medicine

## 2017-09-06 ENCOUNTER — Ambulatory Visit: Payer: 59 | Admitting: Medical

## 2017-09-06 ENCOUNTER — Encounter: Payer: Self-pay | Admitting: Family Medicine

## 2017-09-06 ENCOUNTER — Ambulatory Visit
Admission: RE | Admit: 2017-09-06 | Discharge: 2017-09-06 | Disposition: A | Discharge status: Home / Self Care | Payer: POS | Source: Ambulatory Visit | Attending: Radiation Oncology | Admitting: Radiation Oncology

## 2017-09-06 VITALS — BP 120/86 | HR 84 | Temp 98.8°F | Ht 72.0 in | Wt 242.8 lb

## 2017-09-06 DIAGNOSIS — R05 Cough: Secondary | ICD-10-CM

## 2017-09-06 DIAGNOSIS — J302 Other seasonal allergic rhinitis: Secondary | ICD-10-CM | POA: Diagnosis not present

## 2017-09-06 DIAGNOSIS — J45901 Unspecified asthma with (acute) exacerbation: Secondary | ICD-10-CM | POA: Diagnosis not present

## 2017-09-06 DIAGNOSIS — R059 Cough, unspecified: Secondary | ICD-10-CM

## 2017-09-06 DIAGNOSIS — Z510 Encounter for antineoplastic radiation therapy: Secondary | ICD-10-CM | POA: Diagnosis not present

## 2017-09-06 DIAGNOSIS — Z170 Estrogen receptor positive status [ER+]: Principal | ICD-10-CM

## 2017-09-06 DIAGNOSIS — C50312 Malignant neoplasm of lower-inner quadrant of left female breast: Secondary | ICD-10-CM

## 2017-09-06 MED ORDER — RADIAPLEXRX EX GEL
Freq: Once | CUTANEOUS | Status: AC
  Administered 2017-09-06: 15:00:00 via TOPICAL

## 2017-09-06 MED ORDER — ALBUTEROL SULFATE (2.5 MG/3ML) 0.083% IN NEBU
2.5000 mg | INHALATION_SOLUTION | Freq: Once | RESPIRATORY_TRACT | Status: AC
Start: 2017-09-06 — End: 2017-09-06
  Administered 2017-09-06: 2.5 mg via RESPIRATORY_TRACT

## 2017-09-06 NOTE — Progress Notes (Signed)
Chief Complaint  Patient presents with  . Cough    and chest congestion x 1 month. Mucus is clear to white. No fevers. No body aches or chills.    Patient presents with complaint of one month of cough.  She was diagnosed with breast cancer, had been out of work for 6 weeks after breast surgeries, now undergoing radiation. She was doing well until she woke up 2/25th with a sore throat, which progressed into a cold. She has had persistent cough since that time.  Rest of cold symptoms resolved.   Cough is productive of clear-white mucus.  Sometimes she feels like a tickle in the throat,but also feels chest congestion.   +throat-clearing.  No sniffling/sneezing. She has h/o asthma and has used albuterol MDI 1-2 times/day (thought not in the last 1-2 days), which does help somewhat.  Not much shortness of breath, only if very congested. She is taking Robitussin DM just at bedtime, only helps short-term.  She has h/o asthma.  Previously took Programmer, systems daily. Once Shane prescribed Symbicort--she never took it, was afraid given the potential risks of LABA's. She still has the full inhaler at home.  She recently had some issues with nosebleeds. She saw ENT, who prescribed nasacort, but she admits she uses this infrequently/sporadically.  PMH, PSH, SH reviewed  Outpatient Encounter Medications as of 09/06/2017  Medication Sig Note  . Adalimumab (HUMIRA) 40 MG/0.8ML PSKT Inject 1 each into the skin every 14 (fourteen) days.    Marland Kitchen albuterol (PROVENTIL HFA;VENTOLIN HFA) 108 (90 Base) MCG/ACT inhaler Inhale 2 puffs into the lungs every 6 (six) hours as needed for wheezing or shortness of breath.   . Cholecalciferol (VITAMIN D3) 1000 units CAPS Take 2 capsules by mouth daily.    Marland Kitchen guaiFENesin-dextromethorphan (ROBITUSSIN DM) 100-10 MG/5ML syrup Take 10 mLs by mouth every 4 (four) hours as needed for cough.   . triamcinolone (NASACORT) 55 MCG/ACT AERO nasal inhaler Place 2 sprays into the nose as  needed (congestion). 09/06/2017: Using sporadically   . anastrozole (ARIMIDEX) 1 MG tablet Take 1 tablet (1 mg total) by mouth daily. (Patient not taking: Reported on 09/06/2017) 09/06/2017: Will start in Easter  . [EXPIRED] albuterol (PROVENTIL) (2.5 MG/3ML) 0.083% nebulizer solution 2.5 mg     No facility-administered encounter medications on file as of 09/06/2017.    No Known Allergies  ROS: No fever, chills, nausea, vomiting, diarrhea. No headaches, dizziness, chest pain.  +cough and chest congestion per HPI. No bleeding, bruising, rash. See HPI  PHYSICAL EXAM:  BP 120/86   Pulse 84   Temp 98.8 F (37.1 C) (Tympanic)   Ht 6' (1.829 m)   Wt 242 lb 12.8 oz (110.1 kg)   BMI 32.93 kg/m   Well-appearing, pleasant female who is speaking easily, in no distress. She has frequent throat-clearing, sniffling. Intermittent coughing spells. HEENT: PERRL, EOMI, conjunctiva and sclera are clear. TM's and EAC's normal. Nasal mucosa is moderately edematous with clear mucus. Sinuses are nontender. OP is clear Neck: no lymphadenopathy or mass Heart: regular rate and rhythm Lungs:  Diffuse wheezing noted.  After nebulizer, lungs were entirely clear. She had very good response to the treatment. No wheezes, rales, ronchi Extremities: no edema. Skin: normal turgor, no rash Psych: normal mood, affect, hygiene and grooming  ASSESSMENT/PLAN:   Cough - due to combination of allergies, postnasal drainage and asthma/wheezing. Treat underlying conditions. Given good response to neb, avoid steroids - Plan: albuterol (PROVENTIL) (2.5 MG/3ML) 0.083% nebulizer solution 2.5  mg  Seasonal allergic rhinitis, unspecified trigger - start claritin and use nasacort regularly--instructed on proper use. Mucinex should also help with cough and chest congestion  Mild asthma with acute exacerbation, unspecified whether persistent - good response to neb. To start Symbicort BID she has at home, and use albuterol prn.   Drink  plenty of water. Instead of Robitussin DM, change to 12 hour plain Mucinex (600 or 1218m), and take twice daily. Also get the Delsym syrup which is a 12 hour cough suppressant (same medication that is in the Robitussin DM).  Take this at night.  Use during the day only if the cough is very bothersome.    Use the nasacort every day (2 gentle sniffs into each nostril daily). You may want to take claritin once daily while you're waiting for the nasal spray to become fully effective. You responded very nicely to the nebulizer treatment in the office, therefore I think we can avoid steroids at this time (pill form).  I do want you to start the Symbicort that you have at home--I think this will give you could relief of the wheezing/asthma.  Return if you have shortness of breath, fever, discolored mucus, pain with breathing or other concerns.  Return next week for recheck if you are not feeling any better.

## 2017-09-06 NOTE — Patient Instructions (Signed)
  Drink plenty of water. Instead of Robitussin DM, change to 12 hour plain Mucinex (600 or 1271m), and take twice daily. Also get the Delsym syrup which is a 12 hour cough suppressant (same medication that is in the Robitussin DM).  Take this at night.  Use during the day only if the cough is very bothersome.    Use the nasacort every day (2 gentle sniffs into each nostril daily). You may want to take claritin once daily while you're waiting for the nasal spray to become fully effective. You responded very nicely to the nebulizer treatment in the office, therefore I think we can avoid steroids at this time (pill form).  I do want you to start the Symbicort that you have at home--I think this will give you could relief of the wheezing/asthma.  Return if you have shortness of breath, fever, discolored mucus, pain with breathing or other concerns.  Return next week for recheck if you are not feeling any better.

## 2017-09-07 ENCOUNTER — Ambulatory Visit
Admission: RE | Admit: 2017-09-07 | Discharge: 2017-09-07 | Disposition: A | Discharge status: Home / Self Care | Payer: POS | Source: Ambulatory Visit | Attending: Radiation Oncology | Admitting: Radiation Oncology

## 2017-09-07 DIAGNOSIS — Z510 Encounter for antineoplastic radiation therapy: Secondary | ICD-10-CM | POA: Diagnosis not present

## 2017-09-08 ENCOUNTER — Ambulatory Visit
Admission: RE | Admit: 2017-09-08 | Discharge: 2017-09-08 | Disposition: A | Discharge status: Home / Self Care | Payer: POS | Source: Ambulatory Visit | Attending: Radiation Oncology | Admitting: Radiation Oncology

## 2017-09-08 DIAGNOSIS — Z510 Encounter for antineoplastic radiation therapy: Secondary | ICD-10-CM | POA: Diagnosis not present

## 2017-09-14 ENCOUNTER — Encounter: Payer: Self-pay | Admitting: Radiation Oncology

## 2017-09-14 NOTE — Progress Notes (Signed)
  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   The patient denied pain but did endorse mild fatigue during treatment. She endorse radiaplex at site of radiation.  Plan: The patient has completed radiation treatment. The patient will return to radiation oncology clinic for routine followup in one month. I advised them to call or return sooner if they have any questions or concerns related to their recovery or treatment.  -----------------------------------  Eppie Gibson, MD  This document serves as a record of services personally performed by Eppie Gibson, MD. It was created on his behalf by Linward Natal, a trained medical scribe. The creation of this record is based on the scribe's personal observations and the provider's statements to them. This document has been checked and approved by the attending provider.

## 2017-09-16 ENCOUNTER — Ambulatory Visit: Payer: POS | Admitting: Internal Medicine

## 2017-09-16 ENCOUNTER — Other Ambulatory Visit (INDEPENDENT_AMBULATORY_CARE_PROVIDER_SITE_OTHER): Payer: POS

## 2017-09-16 ENCOUNTER — Encounter: Payer: Self-pay | Admitting: Internal Medicine

## 2017-09-16 VITALS — BP 134/82 | HR 68 | Ht 71.0 in | Wt 240.2 lb

## 2017-09-16 DIAGNOSIS — K5150 Left sided colitis without complications: Secondary | ICD-10-CM

## 2017-09-16 DIAGNOSIS — Z79899 Other long term (current) drug therapy: Secondary | ICD-10-CM

## 2017-09-16 DIAGNOSIS — R300 Dysuria: Secondary | ICD-10-CM

## 2017-09-16 DIAGNOSIS — Z7960 Long term (current) use of unspecified immunomodulators and immunosuppressants: Secondary | ICD-10-CM | POA: Insufficient documentation

## 2017-09-16 DIAGNOSIS — Z23 Encounter for immunization: Secondary | ICD-10-CM | POA: Diagnosis not present

## 2017-09-16 LAB — URINALYSIS, ROUTINE W REFLEX MICROSCOPIC
Bilirubin Urine: NEGATIVE
Ketones, ur: NEGATIVE
Nitrite: NEGATIVE
Specific Gravity, Urine: 1.02 (ref 1.000–1.030)
Total Protein, Urine: NEGATIVE
Urine Glucose: NEGATIVE
Urobilinogen, UA: 0.2 (ref 0.0–1.0)
pH: 6.5 (ref 5.0–8.0)

## 2017-09-16 NOTE — Assessment & Plan Note (Signed)
Seems to be in remission

## 2017-09-16 NOTE — Assessment & Plan Note (Signed)
Pneumovax today She asked if Humira could have caused breast cancer. I told her that seems unlikely but could not definitively say - we do think very small increased risk in cancer with Humira and other biologics but given how common breast cancer doubt it was causative

## 2017-09-16 NOTE — Patient Instructions (Addendum)
You have been scheduled for a colonoscopy. Please follow written instructions given to you at your visit today.  Please pick up your prep supplies at the pharmacy. If you use inhalers (even only as needed), please bring them with you on the day of your procedure.   You have been given a pneumovax 23 injection today.   Your provider has requested that you go to the basement level for lab work before leaving today. Press "B" on the elevator. The lab is located at the first door on the left as you exit the elevator.   I appreciate the opportunity to care for you. Silvano Rusk, MD, [facility]

## 2017-09-16 NOTE — Progress Notes (Signed)
See if they can culture this

## 2017-09-16 NOTE — Progress Notes (Addendum)
Katie Robinson 62 y.o. 08/25/1955 [id number]  Assessment & Plan:   Left sided ulcerative (chronic) colitis ([place]) Stable seems to be in remission Continue Humira Colonoscopy to reassess mucosa The risks and benefits as well as alternatives of endoscopic procedure(s) have been discussed and reviewed. All questions answered. The patient agrees to proceed.   Long-term use of immunosuppressant medication Pneumovax today She asked if Humira could have caused breast cancer. I told her that seems unlikely but could not definitively say - we do think very small increased risk in cancer with Humira and other biologics but given how common breast cancer doubt it was causative  Dysuria  UA w/ reflex cx  I appreciate the opportunity to care for this patient. Cc;Tysinger, Camelia Eng, PA-C    Subjective:   Chief Complaint: f/u ulcerative colitis  HPI 62 yo AA female for f/u Left-sided UC - on Humira since 2017 w 6 MP x 6 mos (last seen here 09/2016) and doing well. Occasional loose stools but overall well w/o bleeding, abd pain. Finished XRT for left breast cancer and is on hormone Tx. Back to work recently. ? If she has UTI - having intermittent urgency and urinary frequency in past week - and some mild burning with urination No Known Allergies Current Meds  Medication Sig  . Adalimumab (HUMIRA) 40 MG/0.8ML PSKT Inject 1 each into the skin every 14 (fourteen) days.   Marland Kitchen albuterol (PROVENTIL HFA;VENTOLIN HFA) 108 (90 Base) MCG/ACT inhaler Inhale 2 puffs into the lungs every 6 (six) hours as needed for wheezing or shortness of breath.  . anastrozole (ARIMIDEX) 1 MG tablet Take 1 tablet (1 mg total) by mouth daily.  . budesonide-formoterol (SYMBICORT) 80-4.5 MCG/ACT inhaler Inhale 2 puffs into the lungs 2 (two) times daily.  . Cholecalciferol (VITAMIN D3) 1000 units CAPS Take 2 capsules by mouth daily.   Marland Kitchen loratadine (CLARITIN) 10 MG tablet Take 10 mg by mouth daily.  Marland Kitchen triamcinolone  (NASACORT) 55 MCG/ACT AERO nasal inhaler Place 2 sprays into the nose as needed (congestion).  . [DISCONTINUED] guaiFENesin-dextromethorphan (ROBITUSSIN DM) 100-10 MG/5ML syrup Take 10 mLs by mouth every 4 (four) hours as needed for cough.   Past Medical History:  Diagnosis Date  . Allergy    SEASONAL  . Anemia   . Arthralgia   . Asthma    controlled  . Breast cancer, left breast ([place]) 05/2017   left breast cancer  . Chronic headache    many years  . Family history of premature coronary artery disease    father  . Former smoker    10 pack year history  . GERD (gastroesophageal reflux disease)    mild, occasional  . H/O bone density study 11/2015   normal  . H/O cardiovascular stress test 2009  . Hx of adenomatous and sessile serrated colonic polyps 05/24/2014  . Iron deficiency 11/29/2015  . Obesity   . Pectus excavatum    mild, left asymmetric  . S/P hysterectomy    due to fibroids  . Seasonal allergic rhinitis   . UC (ulcerative colitis) ([place]) 2015   Dr. Carlean Purl  . Urinary tract infection 2013  . Vitamin D deficiency 07/18/2014  . Wears glasses    Past Surgical History:  Procedure Laterality Date  . BREAST LUMPECTOMY WITH RADIOACTIVE SEED AND SENTINEL LYMPH NODE BIOPSY Left 06/29/2017   Procedure: LEFT BREAST LUMPECTOMY WITH RADIOACTIVE SEED AND LEFT AXILLARY SENTINEL LYMPH NODE BIOPSY ERAS PATHWAY;  Surgeon: Rolm Bookbinder, MD;  Location:  [place];  Service: General;  Laterality: Left;  . COLONOSCOPY  02/2016   polyps; Dr. Silvano Rusk  . EVACUATION BREAST HEMATOMA Left 07/11/2017   Procedure: EVACUATION HEMATOMA BREAST;  Surgeon: Leighton Ruff, MD;  Location: [facility];  Service: General;  Laterality: Left;  . FOOT TENDON SURGERY Left 2013  . NAVEL REMOVED     AS A CHILD  . OTHER SURGICAL HISTORY     biopsy for possible sarcoidosis  . POLYPECTOMY    . SKIN BIOPSY    . TONSILLECTOMY    . TOTAL ABDOMINAL HYSTERECTOMY  2002   total; Dr. Molli Posey  . WISDOM TOOTH EXTRACTION     Social History   Social History Narrative   Lives with her son, has 2 adult children, not married, no exercise, active with walking on the job at the [employer].   04/2017   family history includes Alzheimer's disease in her mother; Aneurysm in her mother; Asthma in her father and sister; Heart disease in her mother; Heart disease (age of onset: 10) in her father; Hypertension in her mother and sister.   Review of Systems As above  Objective:   Physical Exam @BP  134/82   Pulse 68   Ht 5' 11"  (1.803 m)   Wt 240 lb 4 oz (109 kg)   BMI 33.51 kg/m @  General:  NAD Eyes:   anicteric Lungs:  clear Heart::  S1S2 no rubs, murmurs or gallops Abdomen:  soft and nontender, BS+ Ext:   no edema, cyanosis or clubbing    Data Reviewed:  See HPI

## 2017-09-16 NOTE — Assessment & Plan Note (Addendum)
Stable seems to be in remission Continue Humira Colonoscopy to reassess mucosa The risks and benefits as well as alternatives of endoscopic procedure(s) have been discussed and reviewed. All questions answered. The patient agrees to proceed.

## 2017-09-17 ENCOUNTER — Other Ambulatory Visit: Payer: POS

## 2017-09-17 DIAGNOSIS — R300 Dysuria: Secondary | ICD-10-CM

## 2017-09-19 LAB — URINE CULTURE
MICRO NUMBER: 90422818
SPECIMEN QUALITY:: ADEQUATE

## 2017-09-20 ENCOUNTER — Encounter: Payer: Self-pay | Admitting: Internal Medicine

## 2017-09-20 ENCOUNTER — Other Ambulatory Visit: Payer: Self-pay

## 2017-09-20 DIAGNOSIS — Z1612 Extended spectrum beta lactamase (ESBL) resistance: Secondary | ICD-10-CM

## 2017-09-20 DIAGNOSIS — B9629 Other Escherichia coli [E. coli] as the cause of diseases classified elsewhere: Secondary | ICD-10-CM

## 2017-09-20 DIAGNOSIS — N390 Urinary tract infection, site not specified: Secondary | ICD-10-CM

## 2017-09-20 HISTORY — DX: Urinary tract infection, site not specified: B96.29

## 2017-09-20 HISTORY — DX: Urinary tract infection, site not specified: N39.0

## 2017-09-20 MED ORDER — LEVOFLOXACIN 250 MG PO TABS: 250.0000 mg | ORAL_TABLET | Freq: Every day | ORAL | 0 refills | Status: DC

## 2017-09-20 NOTE — Progress Notes (Signed)
She has a UTI  Please Rx Levaquin 250 mg qd x 5 days  If sxs persist she should see PCP

## 2017-10-08 ENCOUNTER — Encounter: Payer: Self-pay | Admitting: Radiation Oncology

## 2017-10-13 ENCOUNTER — Ambulatory Visit
Admission: RE | Admit: 2017-10-13 | Discharge: 2017-10-13 | Disposition: A | Discharge status: Home / Self Care | Payer: POS | Source: Ambulatory Visit | Attending: Radiation Oncology | Admitting: Radiation Oncology

## 2017-10-13 ENCOUNTER — Encounter: Payer: Self-pay | Admitting: Radiation Oncology

## 2017-10-13 ENCOUNTER — Other Ambulatory Visit: Payer: Self-pay

## 2017-10-13 VITALS — BP 147/89 | HR 82 | Temp 98.4°F | Resp 18 | Ht 71.0 in | Wt 246.2 lb

## 2017-10-13 DIAGNOSIS — Z923 Personal history of irradiation: Secondary | ICD-10-CM | POA: Diagnosis not present

## 2017-10-13 DIAGNOSIS — Z170 Estrogen receptor positive status [ER+]: Secondary | ICD-10-CM | POA: Diagnosis not present

## 2017-10-13 DIAGNOSIS — D0512 Intraductal carcinoma in situ of left breast: Secondary | ICD-10-CM

## 2017-10-13 DIAGNOSIS — C50312 Malignant neoplasm of lower-inner quadrant of left female breast: Secondary | ICD-10-CM | POA: Insufficient documentation

## 2017-10-13 DIAGNOSIS — Z79899 Other long term (current) drug therapy: Secondary | ICD-10-CM | POA: Insufficient documentation

## 2017-10-13 HISTORY — DX: Personal history of irradiation: Z92.3

## 2017-10-13 NOTE — Progress Notes (Signed)
Radiation Oncology         (336) [phone] ________________________________  Name: LUWANDA STARR MRN: [id number]  Date: 10/13/2017  DOB: February 20, 1956  Follow-Up Visit Note  Outpatient  CC: Tysinger, Camelia Eng, PA-C  Magrinat, Virgie Dad, MD  Diagnosis and Prior Radiotherapy:    ICD-10-CM   1. 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, estrogen receptor positive ([place]) C50.312    Z17.0   2. Ductal carcinoma in situ (DCIS) of left breast D05.12     Stage0LeftBreast LIQ Ductal Carcinomain situER: 50%, Positive/ PR:0%, Negative, Grade High  Radiation treatment dates:   08/12/17 - 09/08/17  Site/dose:   1) Left Breast / 40.05 Gy in 15 fractions 2) Left Breast Boost / 10 Gy in 5 fractions  CHIEF COMPLAINT: Here for follow-up and surveillance of left breast cancer  Narrative:  The patient returns today for routine follow-up. She completed radiation treatment on 09/08/2017.  She is doing well, with no complaints. Using radiaplex with good skin healing.    ALLERGIES:  has No Known Allergies.  Meds: Current Outpatient Medications  Medication Sig Dispense Refill  . Adalimumab (HUMIRA) 40 MG/0.8ML PSKT Inject 1 each into the skin every 14 (fourteen) days.     Marland Kitchen albuterol (PROVENTIL HFA;VENTOLIN HFA) 108 (90 Base) MCG/ACT inhaler Inhale 2 puffs into the lungs every 6 (six) hours as needed for wheezing or shortness of breath. 18 g 1  . anastrozole (ARIMIDEX) 1 MG tablet Take 1 tablet (1 mg total) by mouth daily. 90 tablet 4  . budesonide-formoterol (SYMBICORT) 80-4.5 MCG/ACT inhaler Inhale 2 puffs into the lungs 2 (two) times daily.    . Cholecalciferol (VITAMIN D3) 1000 units CAPS Take 2 capsules by mouth daily.     Marland Kitchen loratadine (CLARITIN) 10 MG tablet Take 10 mg by mouth daily.    Marland Kitchen triamcinolone (NASACORT) 55 MCG/ACT AERO nasal inhaler Place 2 sprays into the nose as needed (congestion).     No current facility-administered medications for this encounter.     Physical  Findings: The patient is in no acute distress. Patient is alert and oriented.  height is 5' 11"  (1.803 m) and weight is 246 lb 3.2 oz (111.7 kg). Her temperature is 98.4 F (36.9 C). Her blood pressure is 147/89 (abnormal) and her pulse is 82. Her respiration is 18 and oxygen saturation is 99%. .    Satisfactory skin healing in radiotherapy fields. Hyperpigmentation over left breast, left breast is smaller than right.   Lab Findings: Lab Results  Component Value Date   WBC 14.8 (H) 07/11/2017   HGB 13.7 07/11/2017   HCT 40.8 07/11/2017   MCV 85.4 07/11/2017   PLT 277 07/11/2017    Radiographic Findings: No results found.  Impression/Plan: Healing well from radiotherapy to the breast tissue.  Continue skin care with topical Vitamin E Oil and / or lotion for at least 2 more months for further healing.  I encouraged her to continue with yearly mammography as appropriate and followup with medical oncology. Continue anti estrogens.  I will see her back on an as-needed basis. I have encouraged her to call if she has any issues or concerns in the future. I wished her the very best. ___________   Eppie Gibson, MD  This document serves as a record of services personally performed by Eppie Gibson, MD. It was created on her behalf by Steva Colder, a trained medical scribe. The creation of this record is based on the scribe's personal observations and  the provider's statements to them. This document has been checked and approved by the attending provider.

## 2017-10-13 NOTE — Progress Notes (Signed)
Katie Robinson presents for follow up of radiation completed 09/08/17 to her Left Breast. She denies pain or fatigue. She has hyperpigmentation to her Left Breast. She is using radiaplex currently and will switch to a vitamin E oil or lotion when the radiaplex is complete. She started taking anastrozole on 10/04/17, and will see Dr. Jana Hakim on 12/06/17 next. She has an appointment with survivorship on 03/11/18.   BP (!) 147/89   Pulse 82   Temp 98.4 F (36.9 C)   Resp 18   Ht 5' 11"  (1.803 m)   Wt 246 lb 3.2 oz (111.7 kg)   SpO2 99% Comment: room air  BMI 34.34 kg/m    Wt Readings from Last 3 Encounters:  10/13/17 246 lb 3.2 oz (111.7 kg)  09/16/17 240 lb 4 oz (109 kg)  09/06/17 242 lb 12.8 oz (110.1 kg)

## 2017-11-12 ENCOUNTER — Other Ambulatory Visit: Payer: Self-pay | Admitting: Internal Medicine

## 2017-11-18 ENCOUNTER — Encounter: Payer: Self-pay | Admitting: Internal Medicine

## 2017-11-25 ENCOUNTER — Encounter: Payer: Self-pay | Admitting: Internal Medicine

## 2017-11-25 ENCOUNTER — Ambulatory Visit (AMBULATORY_SURGERY_CENTER): Payer: POS | Admitting: Internal Medicine

## 2017-11-25 ENCOUNTER — Other Ambulatory Visit: Payer: Self-pay

## 2017-11-25 ENCOUNTER — Telehealth: Payer: Self-pay | Admitting: Internal Medicine

## 2017-11-25 VITALS — BP 174/78 | HR 68 | Temp 97.8°F | Resp 11 | Ht 71.0 in | Wt 240.0 lb

## 2017-11-25 DIAGNOSIS — K6389 Other specified diseases of intestine: Secondary | ICD-10-CM | POA: Diagnosis not present

## 2017-11-25 DIAGNOSIS — K5150 Left sided colitis without complications: Secondary | ICD-10-CM

## 2017-11-25 DIAGNOSIS — K5140 Inflammatory polyps of colon without complications: Secondary | ICD-10-CM | POA: Diagnosis not present

## 2017-11-25 MED ORDER — PANTOPRAZOLE SODIUM 40 MG PO TBEC: 40.0000 mg | DELAYED_RELEASE_TABLET | Freq: Every day | ORAL | 0 refills | Status: DC

## 2017-11-25 MED ORDER — SODIUM CHLORIDE 0.9 % IV SOLN
500.0000 mL | Freq: Once | INTRAVENOUS | Status: DC
Start: 2017-11-25 — End: 2020-04-02

## 2017-11-25 NOTE — Patient Instructions (Addendum)
Things look better - I saw pseudopolyps - these are essentially scars from prior colitis.  I took biopsies (standard to do so) and will let you know results and plans.  I appreciate the opportunity to care for you. Gatha Mayer, MD, [facility]  Resume previous diet. Continue present medications.  Pantoprazole (Protonix) 40 mg. By mouth daily before breakfast  Repeat colonoscopy recommended for surveillance.  Date to be determined after pathology results reviewed.  YOU HAD AN ENDOSCOPIC PROCEDURE TODAY AT [facility]:   Refer to the procedure report that was given to you for any specific questions about what was found during the examination.  If the procedure report does not answer your questions, please call your gastroenterologist to clarify.  If you requested that your care partner not be given the details of your procedure findings, then the procedure report has been included in a sealed envelope for you to review at your convenience later.  YOU SHOULD EXPECT: Some feelings of bloating in the abdomen. Passage of more gas than usual.  Walking can help get rid of the air that was put into your GI tract during the procedure and reduce the bloating. If you had a lower endoscopy (such as a colonoscopy or flexible sigmoidoscopy) you may notice spotting of blood in your stool or on the toilet paper. If you underwent a bowel prep for your procedure, you may not have a normal bowel movement for a few days.  Please Note:  You might notice some irritation and congestion in your nose or some drainage.  This is from the oxygen used during your procedure.  There is no need for concern and it should clear up in a day or so.  SYMPTOMS TO REPORT IMMEDIATELY:   Following lower endoscopy (colonoscopy or flexible sigmoidoscopy):  Excessive amounts of blood in the stool  Significant tenderness or worsening of abdominal pains  Swelling of the abdomen that is new, acute  Fever of 100F  or higher  For urgent or emergent issues, a gastroenterologist can be reached at any hour by calling [phone].   DIET:  We do recommend a small meal at first, but then you may proceed to your regular diet.  Drink plenty of fluids but you should avoid alcoholic beverages for 24 hours.  ACTIVITY:  You should plan to take it easy for the rest of today and you should NOT DRIVE or use heavy machinery until tomorrow (because of the sedation medicines used during the test).    FOLLOW UP: Our staff will call the number listed on your records the next business day following your procedure to check on you and address any questions or concerns that you may have regarding the information given to you following your procedure. If we do not reach you, we will leave a message.  However, if you are feeling well and you are not experiencing any problems, there is no need to return our call.  We will assume that you have returned to your regular daily activities without incident.  If any biopsies were taken you will be contacted by phone or by letter within the next 1-3 weeks.  Please call us at [phone] if you have not heard about the biopsies in 3 weeks.    SIGNATURES/CONFIDENTIALITY: You and/or your care partner have signed paperwork which will be entered into your electronic medical record.  These signatures attest to the fact that that the information above on your After Visit Summary  has been reviewed and is understood.  Full responsibility of the confidentiality of this discharge information lies with you and/or your care-partner.

## 2017-11-25 NOTE — Op Note (Signed)
[facility] Patient Name: Katie Robinson Procedure Date: 11/25/2017 10:45 AM MRN: [id number] Endoscopist: Gatha Mayer , MD Age: 62 Referring MD:  Date of Birth: 1956/06/09 Gender: Female Account #: [id number] Procedure:                Colonoscopy Indications:              Left-sided chronic ulcerative colitis, Follow-up of                            left-sided chronic ulcerative colitis, Disease                            activity assessment of left-sided chronic                            ulcerative colitis, Assess therapeutic response to                            therapy of left-sided chronic ulcerative colitis Medicines:                Propofol per Anesthesia, Monitored Anesthesia Care Procedure:                Pre-Anesthesia Assessment:                           - Prior to the procedure, a History and Physical                            was performed, and patient medications and                            allergies were reviewed. The patient's tolerance of                            previous anesthesia was also reviewed. The risks                            and benefits of the procedure and the sedation                            options and risks were discussed with the patient.                            All questions were answered, and informed consent                            was obtained. Prior Anticoagulants: The patient has                            taken no previous anticoagulant or antiplatelet                            agents. ASA Grade Assessment: II - A patient with  mild systemic disease. After reviewing the risks                            and benefits, the patient was deemed in                            satisfactory condition to undergo the procedure.                           After obtaining informed consent, the colonoscope                            was passed under direct vision. Throughout the    procedure, the patient's blood pressure, pulse, and                            oxygen saturations were monitored continuously. The                            Colonoscope was introduced through the anus and                            advanced to the the cecum, identified by                            appendiceal orifice and ileocecal valve. The                            colonoscopy was performed without difficulty. The                            patient tolerated the procedure well. The quality                            of the bowel preparation was excellent. The                            ileocecal valve, appendiceal orifice, and rectum                            were photographed. The bowel preparation used was                            Miralax. Scope In: 10:57:54 AM Scope Out: 11:10:52 AM Scope Withdrawal Time: 0 hours 10 minutes 12 seconds  Total Procedure Duration: 0 hours 12 minutes 58 seconds  Findings:                 The perianal and digital rectal examinations were                            normal.                           Inflammation characterized by altered vascularity,  pseudopolyps and scarring was found in a continuous                            and circumferential pattern from the rectum to the                            descending colon. The transverse colon, the                            ascending colon and the cecum were spared. When                            compared to previous examinations, the findings are                            improved. Biopsies were taken with a cold forceps                            for histology. Verification of patient                            identification for the specimen was done. Estimated                            blood loss was minimal.                           The exam was otherwise without abnormality on                            direct and retroflexion views.                            Biopsies for histology were taken with a cold                            forceps from the right colon for evaluation of                            microscopic colitis. Complications:            No immediate complications. Estimated Blood Loss:     Estimated blood loss was minimal. Impression:               - Left-sided ulcerative colitis and ulcerative                            colitis, in remission. Inflammation was found from                            the rectum to the descending colon., improved                            compared to previous examinations. Biopsied. Other  than pseudopolyps no signs of inflammation                           - The examination was otherwise normal on direct                            and retroflexion views.                           - Biopsies were taken with a cold forceps from the                            right colon for evaluation of microscopic colitis. Recommendation:           - Patient has a contact number available for                            emergencies. The signs and symptoms of potential                            delayed complications were discussed with the                            patient. Return to normal activities tomorrow.                            Written discharge instructions were provided to the                            patient.                           - Resume previous diet.                           - Continue present medications.                           - Repeat colonoscopy is recommended for                            surveillance. The colonoscopy date will be                            determined after pathology results from today's                            exam become available for review. Gatha Mayer, MD 11/25/2017 11:18:03 AM This report has been signed electronically.

## 2017-11-25 NOTE — Progress Notes (Signed)
Called to room to assist during endoscopic procedure.  Patient ID and intended procedure confirmed with present staff. Received instructions for my participation in the procedure from the performing physician.  

## 2017-11-25 NOTE — Progress Notes (Signed)
Report given to PACU, vss 

## 2017-11-25 NOTE — Telephone Encounter (Signed)
Having sore throat, globus and throat clearing off/on  Allergy Tx did not help  Will try daily PPI and see if that helps

## 2017-11-26 ENCOUNTER — Telehealth: Payer: Self-pay | Admitting: *Deleted

## 2017-11-26 NOTE — Telephone Encounter (Signed)
  Follow up Call-  Call back number 11/25/2017 02/27/2016  Post procedure Call Back phone  # [phone]  Permission to leave phone message Yes Yes  Some recent data might be hidden     Patient questions:  Do you have a fever, pain , or abdominal swelling? No. Pain Score  0 *  Have you tolerated food without any problems? Yes.    Have you been able to return to your normal activities? Yes.    Do you have any questions about your discharge instructions: Diet   No. Medications  No. Follow up visit  No.  Do you have questions or concerns about your Care? No.  Actions: * If pain score is 4 or above: No action needed, pain <4.

## 2017-12-04 ENCOUNTER — Encounter: Payer: Self-pay | Admitting: Internal Medicine

## 2017-12-04 NOTE — Progress Notes (Signed)
Pseudo polyps No colitis Recall 2024

## 2017-12-05 NOTE — Progress Notes (Signed)
[facility]  [phone] [phone]     ID: Katie Robinson DOB: [birth year]  MR#: [id number]  WPV#:[id number]  Patient Care Team: Caryl Ada as PCP - General (Family Medicine) Ahsan Esterline, Virgie Dad, MD as Consulting Physician (Oncology) Gatha Mayer, MD as Consulting Physician (Gastroenterology) OTHER MD:  CHIEF COMPLAINT: DCIS estrogen receptor positive  CURRENT TREATMENT: Anastrozole   HISTORY OF CURRENT ILLNESS: Katie Robinson had routine screening mammography on 06/07/2017 showing a possible abnormality in the left breast. She underwent unilateral left breast diagnostic mammography with tomography and left axilla ultrasonography on 06/17/2017 at [facility] on 06/10/2017 showing: Suspicious, grouped left breast calcifications. Negative for left axillary lymphadenopathy.  Accordingly on 06/14/2017 she proceeded to biopsy of the left breast area in question. 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 Focus Highly suspicious for stromal invasion. Prognostic indicators significant for: estrogen receptor, 50% positive with moderate staining intensity and progesterone receptor, 0% negative.  The patient's subsequent history is as detailed below.  INTERVAL HISTORY: Katie Robinson returns today for follow up and treatment of her estrogen receptor positive non-invasive breast cancer. She completed radiation treatments on 09/08/2017. She tolerated this well. She notes that her energy feels the same. She experienced some skin itching, but no other skin issues.  She started anastrozole, on 10/04/2017. She has some hot flashes, but they aren't too intense. She denies issues with vaginal dryness. She pays $8 for 3 months of this medication.   Since her last visit she underwent left lumpectomy with left axillary sentinel lymph node sampling  ([id number]) on 06/29/2017 with pathology showing: No residual carcinoma. Fibrocystic changes. Final Margin Clear. Five left axillary sentinel lymph nodes were negative for carcinoma. (0/5).  She underwent colonoscopy 11/25/2017 under Silvano Rusk, with no evidence of colitis.    REVIEW OF SYSTEMS: Katie Robinson reports that she has an occasional burning and "popcorn" itching sensation in her throat. She has tried allergy medicine, but this does not help.  She is now trying Prilosec.  For exercise, she has been walking at her job over the last 8 weeks. She works overnight from 8pm -5:30am. She tries to sleep in the morning.  She denies unusual headaches, visual changes, nausea, vomiting, or dizziness. There has been no unusual cough, phlegm production, or pleurisy. This been no change in bowel or bladder habits. She denies unexplained fatigue or unexplained weight loss, bleeding, rash, or fever. A detailed review of systems was otherwise stable.    PAST MEDICAL HISTORY: Past Medical History:  Diagnosis Date  . Allergy    SEASONAL  . Anemia   . Arthralgia   . Asthma    controlled  . Breast cancer, left breast ([facility]) 05/2017   left breast cancer  . Chronic headache    many years  . Family history of premature coronary artery disease    father  . Former smoker    10 pack year history  . GERD (gastroesophageal reflux disease)    mild, occasional  . H/O bone density study 11/2015   normal  . H/O cardiovascular stress test 2009  . History of radiation therapy 08/12/17- 09/08/17   Left Breast 40.05 Gy in 15 fractions, Left Breast Boost/ 10 Gy in 5 fractions.   Marland Kitchen Hx of adenomatous and sessile serrated colonic polyps 05/24/2014  . Iron deficiency 11/29/2015  . Obesity   . Pectus excavatum  mild, left asymmetric  . S/P hysterectomy    due to fibroids  . Seasonal allergic rhinitis   . UC (ulcerative colitis) ([facility]) Aug 13, 2013   Dr. Carlean Purl  . Urinary tract infection Aug 14, 2011  . UTI due to  extended-spectrum beta lactamase (ESBL) producing Escherichia coli 09/20/2017  . Vitamin D deficiency 07/18/2014  . Wears glasses     PAST SURGICAL HISTORY: Past Surgical History:  Procedure Laterality Date  . BREAST LUMPECTOMY WITH RADIOACTIVE SEED AND SENTINEL LYMPH NODE BIOPSY Left 06/29/2017   Procedure: LEFT BREAST LUMPECTOMY WITH RADIOACTIVE SEED AND LEFT AXILLARY SENTINEL LYMPH NODE BIOPSY ERAS PATHWAY;  Surgeon: Rolm Bookbinder, MD;  Location: [facility];  Service: General;  Laterality: Left;  . COLONOSCOPY  02/2016   polyps; Dr. Silvano Rusk  . EVACUATION BREAST HEMATOMA Left 07/11/2017   Procedure: EVACUATION HEMATOMA BREAST;  Surgeon: Leighton Ruff, MD;  Location: [facility];  Service: General;  Laterality: Left;  . FOOT TENDON SURGERY Left 08/14/11  . NAVEL REMOVED     AS A CHILD  . OTHER SURGICAL HISTORY     biopsy for possible sarcoidosis  . POLYPECTOMY    . SKIN BIOPSY    . TONSILLECTOMY    . TOTAL ABDOMINAL HYSTERECTOMY  08/13/2000   total; Dr. Molli Posey  . WISDOM TOOTH EXTRACTION      FAMILY HISTORY Family History  Problem Relation Age of Onset  . Asthma Father   . Heart disease Father 22       died of MI mid 08-13-2022  . Hypertension Sister   . Asthma Sister   . Hypertension Mother   . Aneurysm Mother        brain  . Alzheimer's disease Mother   . Heart disease Mother   . Cancer Neg Hx   . Stroke Neg Hx   . Diabetes Neg Hx   . Colon cancer Neg Hx   . Colon polyps Neg Hx   . Crohn's disease Neg Hx   . Ulcerative colitis Neg Hx   Mother passed at due to brain aneurysm and she also had Alzhimer's. Father passed at 28 due to heart attack and asthma. She has 1 living sister with good health. No history with breast or ovarian cancer in the family.  GYNECOLOGIC HISTORY:  No LMP recorded. Patient has had a hysterectomy. Menarche: 62 years old Age at first live birth: 62 years old GP: GxP2 HRT: Did not use hormone replacement She has a total  hysterectomy with bilateral salpingo-oophorectomy in 08/13/2000 due to fibroids and a chocolate cyst on her ovaries.   SOCIAL HISTORY:  She works as a Scientist, research (medical). She is divorced. Her son lives with her and is unmarried. He works at [employer] as a Technical brewer. Her daughter is a Chiropractor at [employer]. She has 1 grandchild. She belongs to [employer].    ADVANCED DIRECTIVES: Not in place; at the 06/23/2017 visit the patient was given the appropriate documents to complete on notarized at her discretion   HEALTH MAINTENANCE: Social History   Tobacco Use  . Smoking status: Former Smoker    Types: Cigarettes    Last attempt to quit: 06/15/1988    Years since quitting: 29.4  . Smokeless tobacco: Never Used  Substance Use Topics  . Alcohol use: Yes    Alcohol/week: 0.0 oz    Comment: rarely  . Drug use: No     Colonoscopy: 11/25/2017/ [facility]/ normal  PAP: Status post Hysterectomy/ BSO  2002  Bone density: 12/02/2015 T-score +0.2 normal   No Known Allergies  Current Outpatient Medications  Medication Sig Dispense Refill  . albuterol (PROVENTIL HFA;VENTOLIN HFA) 108 (90 Base) MCG/ACT inhaler Inhale 2 puffs into the lungs every 6 (six) hours as needed for wheezing or shortness of breath. 18 g 1  . anastrozole (ARIMIDEX) 1 MG tablet Take 1 tablet (1 mg total) by mouth daily. 90 tablet 4  . budesonide-formoterol (SYMBICORT) 80-4.5 MCG/ACT inhaler Inhale 2 puffs into the lungs 2 (two) times daily.    . Cholecalciferol (VITAMIN D3) 1000 units CAPS Take 2 capsules by mouth daily.     Marland Kitchen HUMIRA 40 MG/0.8ML PSKT INJECT 0.8 ML (40 MG) UNDER THE SKIN EVERY 14 DAYS 2 each 6  . loratadine (CLARITIN) 10 MG tablet Take 10 mg by mouth daily.    . pantoprazole (PROTONIX) 40 MG tablet Take 1 tablet (40 mg total) by mouth daily before breakfast. 90 tablet 0  . triamcinolone (NASACORT) 55 MCG/ACT AERO nasal inhaler Place 2 sprays into the nose as needed (congestion).      Current Facility-Administered Medications  Medication Dose Route Frequency Provider Last Rate Last Dose  . 0.9 %  sodium chloride infusion  500 mL Intravenous Once Gatha Mayer, MD        OBJECTIVE: Middle-aged African-American woman in no acute distress  There were no vitals filed for this visit.   There is no height or weight on file to calculate BMI.   Wt Readings from Last 3 Encounters:  11/25/17 240 lb (108.9 kg)  10/13/17 246 lb 3.2 oz (111.7 kg)  09/16/17 240 lb 4 oz (109 kg)      ECOG FS:0 - Asymptomatic  Sclerae unicteric, EOMs intact Oropharynx clear and moist No cervical or supraclavicular adenopathy Lungs no rales or rhonchi Heart regular rate and rhythm Abd soft, nontender, positive bowel sounds MSK no focal spinal tenderness, no upper extremity lymphedema Neuro: nonfocal, well oriented, appropriate affect Breasts: Breast is unremarkable.  Left breast is status post lumpectomy, with no evidence of disease recurrence.  There is some induration medially.  Both axillae are benign.  LAB RESULTS:  CMP     Component Value Date/Time   NA 140 07/11/2017 1400   K 3.9 07/11/2017 1400   CL 107 07/11/2017 1400   CO2 26 07/11/2017 1400   GLUCOSE 88 07/11/2017 1400   BUN 11 07/11/2017 1400   CREATININE 0.59 07/11/2017 1400   CREATININE 0.59 05/10/2017 0942   CALCIUM 10.3 07/11/2017 1400   PROT 7.3 06/23/2017 0846   ALBUMIN 3.9 06/23/2017 0846   AST 18 06/23/2017 0846   ALT 18 06/23/2017 0846   ALKPHOS 159 (H) 06/23/2017 0846   BILITOT 0.4 06/23/2017 0846   GFRNONAA >60 07/11/2017 1400   GFRAA >60 07/11/2017 1400    No results found for: TOTALPROTELP, ALBUMINELP, A1GS, A2GS, BETS, BETA2SER, GAMS, MSPIKE, SPEI  No results found for: KPAFRELGTCHN, LAMBDASER, KAPLAMBRATIO  Lab Results  Component Value Date   WBC 14.8 (H) 07/11/2017   NEUTROABS 8.8 (H) 07/11/2017   HGB 13.7 07/11/2017   HCT 40.8 07/11/2017   MCV 85.4 07/11/2017   PLT 277 07/11/2017     @LASTCHEMISTRY @  No results found for: LABCA2  No components found for: EPPIRJ188  No results for input(s): INR in the last 168 hours.  No results found for: LABCA2  No results found for: CZY606  No results found for: TKZ601  No results found for: UXN235  No results  found for: CA2729  No components found for: HGQUANT  No results found for: CEA1 / No results found for: CEA1   No results found for: AFPTUMOR  No results found for: CHROMOGRNA  No results found for: PSA1  No visits with results within 3 Day(s) from this visit.  Latest known visit with results is:  Lab on 09/17/2017  Component Date Value Ref Range Status  . MICRO NUMBER: 09/17/2017 [id number]   Final  . SPECIMEN QUALITY: 09/17/2017 ADEQUATE   Final  . Sample Source 09/17/2017 NOT GIVEN   Final  . STATUS: 09/17/2017 FINAL   Final  . ISOLATE 1: 09/17/2017 ESBL Escherichia coli*  Final   10,000-50,000 CFU/mL of Escherichia coli (ESBL) ESBL RESULT:        The organism has been confirmed as an ESBL producer.    (this displays the last labs from the last 3 days)  No results found for: TOTALPROTELP, ALBUMINELP, A1GS, A2GS, BETS, BETA2SER, GAMS, MSPIKE, SPEI (this displays SPEP labs)  No results found for: KPAFRELGTCHN, LAMBDASER, KAPLAMBRATIO (kappa/lambda light chains)  No results found for: HGBA, HGBA2QUANT, HGBFQUANT, HGBSQUAN (Hemoglobinopathy evaluation)   No results found for: LDH  Lab Results  Component Value Date   IRON 45 04/05/2016   TIBC 141 (L) 04/05/2016   IRONPCTSAT 32 (H) 04/05/2016   (Iron and TIBC)  Lab Results  Component Value Date   FERRITIN 68 04/05/2016    Urinalysis    Component Value Date/Time   COLORURINE YELLOW 09/16/2017 [place] 09/16/2017 1220   LABSPEC 1.020 09/16/2017 1220   LABSPEC 1.020 05/10/2017 0912   PHURINE 6.5 09/16/2017 1220   GLUCOSEU NEGATIVE 09/16/2017 1220   HGBUR TRACE-INTACT (A) 09/16/2017 1220   BILIRUBINUR NEGATIVE  09/16/2017 1220   BILIRUBINUR negative 05/10/2017 0912   BILIRUBINUR n 04/29/2015 [place] 09/16/2017 1220   PROTEINUR negative 05/10/2017 0912   PROTEINUR NEGATIVE 04/04/2016 1552   UROBILINOGEN 0.2 09/16/2017 1220   NITRITE NEGATIVE 09/16/2017 1220   LEUKOCYTESUR TRACE (A) 09/16/2017 1220     STUDIES: No results found.  ELIGIBLE FOR AVAILABLE RESEARCH PROTOCOL: No  ASSESSMENT: 62 y.o. [place] woman status post left breast lower inner quadrant biopsy 06/14/2017 for ductal carcinoma in situ, grade 3, with an area suspicious for microinvasion, estrogen receptor positive progesterone receptor negative  (1) status post left lumpectomy and sentinel lymph node sampling 06/29/2017 showing no residual carcinoma, with clear margins  (a) a total of 5 sentinel lymph nodes were removed  (2) adjuvant radiation 08/12/17 - 09/08/17 Site/dose:    1) Left Breast / 40.05 Gy in 15 fractions 2) Left Breast Boost / 10 Gy in 5 fractions  (3) anastrozole started April 2019  PLAN: Katie Robinson did very well with her radiation and she is tolerating the anastrozole well.  The plan will be to continue anastrozole for a total of 5 years.  Today we reviewed her pathology and she understands she has noninvasive ductal breast cancer.  She also understands that this was estrogen dependence.  I gave her all that information again in writing.  Her prognosis is excellent.  I am going to see her on a once a year basis and I will not be checking lab work.  Next mammogram is due December of this year and the order has been placed.  Devonta Blanford, Virgie Dad, MD  12/05/17 8:31 PM Medical Oncology and Hematology [facility] [address] [place], [place] [address] Tel. [phone]  Fax. [phone]  I, Sheron Nightingale, am acting as scribe for Chauncey Cruel MD.  I, Lurline Del MD, have reviewed the above documentation for accuracy and completeness, and I agree with the  above.

## 2017-12-06 ENCOUNTER — Inpatient Hospital Stay: Payer: POS | Attending: Oncology | Admitting: Oncology

## 2017-12-06 VITALS — BP 151/97 | HR 80 | Temp 98.5°F | Resp 18 | Ht 71.0 in | Wt 244.4 lb

## 2017-12-06 DIAGNOSIS — C50312 Malignant neoplasm of lower-inner quadrant of left female breast: Secondary | ICD-10-CM

## 2017-12-06 DIAGNOSIS — D0512 Intraductal carcinoma in situ of left breast: Secondary | ICD-10-CM | POA: Insufficient documentation

## 2017-12-06 DIAGNOSIS — Z79811 Long term (current) use of aromatase inhibitors: Secondary | ICD-10-CM | POA: Diagnosis not present

## 2017-12-06 DIAGNOSIS — Z923 Personal history of irradiation: Secondary | ICD-10-CM | POA: Insufficient documentation

## 2017-12-06 DIAGNOSIS — Z170 Estrogen receptor positive status [ER+]: Secondary | ICD-10-CM | POA: Insufficient documentation

## 2017-12-17 ENCOUNTER — Telehealth: Payer: Self-pay

## 2017-12-17 ENCOUNTER — Other Ambulatory Visit: Payer: Self-pay

## 2017-12-17 DIAGNOSIS — Z13820 Encounter for screening for osteoporosis: Secondary | ICD-10-CM

## 2017-12-17 NOTE — Telephone Encounter (Signed)
-----   Message from Marlon Pel, RN sent at 12/18/2015  4:23 PM EDT ----- Needs Bone density- see results from 7/5/17[name]

## 2017-12-17 NOTE — Telephone Encounter (Signed)
Bone Density is scheduled for 12-27-17 at 9:30 am.  I left her a voicemail.

## 2017-12-27 ENCOUNTER — Ambulatory Visit (INDEPENDENT_AMBULATORY_CARE_PROVIDER_SITE_OTHER)
Admission: RE | Admit: 2017-12-27 | Discharge: 2017-12-27 | Disposition: A | Discharge status: Home / Self Care | Payer: POS | Source: Ambulatory Visit | Attending: Internal Medicine | Admitting: Internal Medicine

## 2017-12-27 ENCOUNTER — Inpatient Hospital Stay: Admission: RE | Admit: 2017-12-27 | Payer: POS | Source: Ambulatory Visit

## 2017-12-27 DIAGNOSIS — Z13820 Encounter for screening for osteoporosis: Secondary | ICD-10-CM

## 2017-12-29 NOTE — Progress Notes (Signed)
Please let her know bone density is good - NL! She should get 1200 mg calcium in a day from diet and/or supplements and if not on 1000 IU vit D daily she should take that  For calcium I suggest 4  500 mg Tums a day taken separately (2 at a time)  figuring she will get rest of it in her diet There is 200 mg of calcium available to the body per 500 mg Tums so she will get 800 mg from the Tums

## 2018-02-21 ENCOUNTER — Other Ambulatory Visit: Payer: Self-pay | Admitting: Internal Medicine

## 2018-03-11 ENCOUNTER — Inpatient Hospital Stay: Payer: POS | Attending: Oncology | Admitting: Adult Health

## 2018-03-11 ENCOUNTER — Encounter: Payer: Self-pay | Admitting: Adult Health

## 2018-03-11 VITALS — BP 146/91 | HR 77 | Temp 98.6°F | Resp 18 | Ht 71.0 in | Wt 238.4 lb

## 2018-03-11 DIAGNOSIS — Z923 Personal history of irradiation: Secondary | ICD-10-CM | POA: Diagnosis not present

## 2018-03-11 DIAGNOSIS — R928 Other abnormal and inconclusive findings on diagnostic imaging of breast: Secondary | ICD-10-CM

## 2018-03-11 DIAGNOSIS — Z171 Estrogen receptor negative status [ER-]: Secondary | ICD-10-CM

## 2018-03-11 DIAGNOSIS — D0512 Intraductal carcinoma in situ of left breast: Secondary | ICD-10-CM | POA: Diagnosis not present

## 2018-03-11 DIAGNOSIS — N63 Unspecified lump in unspecified breast: Secondary | ICD-10-CM

## 2018-03-11 DIAGNOSIS — Z79811 Long term (current) use of aromatase inhibitors: Secondary | ICD-10-CM | POA: Diagnosis not present

## 2018-03-11 DIAGNOSIS — Z87891 Personal history of nicotine dependence: Secondary | ICD-10-CM | POA: Diagnosis not present

## 2018-03-11 NOTE — Progress Notes (Signed)
CLINIC:  Survivorship   REASON FOR VISIT:  Routine follow-up post-treatment for a recent history of breast cancer.  BRIEF ONCOLOGIC HISTORY:    Ductal carcinoma in situ (DCIS) of left breast   06/14/2017 Initial Biopsy    status post left breast lower inner quadrant biopsy for ductal carcinoma in situ, grade 3, with an area suspicious for microinvasion, estrogen receptor positive progesterone receptor negative    06/18/2017 Initial Diagnosis    Ductal carcinoma in situ (DCIS) of left breast    06/29/2017 Surgery    status post left lumpectomy and sentinel lymph node sampling 06/29/2017 showing no residual carcinoma, with clear margins             (a) a total of 5 sentinel lymph nodes were removed     08/12/2017 - 09/08/2017 Radiation Therapy    adjuvant radiation 08/12/17 - 09/08/17 Site/dose: 1) Left Breast / 40.05Gy in 15 fractions 2) Left Breast Boost / 10 Gy in 5 fractions     09/2017 -  Anti-estrogen oral therapy    Anastrozole daily     INTERVAL HISTORY:  Katie Robinson presents to the [facility] today for our initial meeting to review her survivorship care plan detailing her treatment course for breast cancer, as well as monitoring long-term side effects of that treatment, education regarding health maintenance, screening, and overall wellness and health promotion.     Overall, Katie Robinson reports feeling well.  She is taking Anastrozole daily.  She notes intermittent hot flashes.  She notes a weight gain since taking it.  She notes her facial hair seems worse since taking also.  She exercises by walking 5-6 days per week.    REVIEW OF SYSTEMS:  Review of Systems  Constitutional: Negative for appetite change, chills, diaphoresis, fatigue and fever.  HENT:   Negative for hearing loss, lump/mass, sore throat and trouble swallowing.   Eyes: Negative for eye problems and icterus.  Respiratory: Negative for chest tightness and cough.   Cardiovascular: Negative for  chest pain, leg swelling and palpitations.  Gastrointestinal: Negative for abdominal distention, abdominal pain, constipation, diarrhea, nausea and vomiting.  Endocrine: Positive for hot flashes.  Musculoskeletal: Negative for arthralgias.  Skin: Negative for itching and rash.  Neurological: Negative for dizziness, extremity weakness, headaches and numbness.  Hematological: Negative for adenopathy. Does not bruise/bleed easily.  Psychiatric/Behavioral: Negative for depression. The patient is not nervous/anxious.   Breast: Denies any new nodularity, masses, tenderness, nipple changes, or nipple discharge.      ONCOLOGY TREATMENT TEAM:  1. Surgeon:  Dr. Donne Hazel at [facility] 2. Medical Oncologist: Dr. Jana Hakim  3. Radiation Oncologist: Dr. Isidore Moos    PAST MEDICAL/SURGICAL HISTORY:  Past Medical History:  Diagnosis Date  . Allergy    SEASONAL  . Anemia   . Arthralgia   . Asthma    controlled  . Breast cancer, left breast ([place]) 05/2017   left breast cancer  . Chronic headache    many years  . Family history of premature coronary artery disease    father  . Former smoker    10 pack year history  . GERD (gastroesophageal reflux disease)    mild, occasional  . H/O bone density study 11/2015   normal  . H/O cardiovascular stress test 2009  . History of radiation therapy 08/12/17- 09/08/17   Left Breast 40.05 Gy in 15 fractions, Left Breast Boost/ 10 Gy in 5 fractions.   Marland Kitchen Hx of adenomatous and sessile serrated colonic  polyps 05/24/2014  . Iron deficiency 11/29/2015  . Obesity   . Pectus excavatum    mild, left asymmetric  . S/P hysterectomy    due to fibroids  . Seasonal allergic rhinitis   . UC (ulcerative colitis) ([place]) 2013/09/01   Dr. Carlean Purl  . Urinary tract infection Sep 02, 2011  . UTI due to extended-spectrum beta lactamase (ESBL) producing Escherichia coli 09/20/2017  . Vitamin D deficiency 07/18/2014  . Wears glasses    Past Surgical History:  Procedure  Laterality Date  . BREAST LUMPECTOMY WITH RADIOACTIVE SEED AND SENTINEL LYMPH NODE BIOPSY Left 06/29/2017   Procedure: LEFT BREAST LUMPECTOMY WITH RADIOACTIVE SEED AND LEFT AXILLARY SENTINEL LYMPH NODE BIOPSY ERAS PATHWAY;  Surgeon: Rolm Bookbinder, MD;  Location: [facility];  Service: General;  Laterality: Left;  . COLONOSCOPY  02/2016   polyps; Dr. Silvano Rusk  . EVACUATION BREAST HEMATOMA Left 07/11/2017   Procedure: EVACUATION HEMATOMA BREAST;  Surgeon: Leighton Ruff, MD;  Location: [facility];  Service: General;  Laterality: Left;  . FOOT TENDON SURGERY Left 09/02/2011  . NAVEL REMOVED     AS A CHILD  . OTHER SURGICAL HISTORY     biopsy for possible sarcoidosis  . POLYPECTOMY    . SKIN BIOPSY    . TONSILLECTOMY    . TOTAL ABDOMINAL HYSTERECTOMY  09-01-00   total; Dr. Molli Posey  . WISDOM TOOTH EXTRACTION       ALLERGIES:  No Known Allergies   CURRENT MEDICATIONS:  Outpatient Encounter Medications as of 03/11/2018  Medication Sig Note  . albuterol (PROVENTIL HFA;VENTOLIN HFA) 108 (90 Base) MCG/ACT inhaler Inhale 2 puffs into the lungs every 6 (six) hours as needed for wheezing or shortness of breath.   . anastrozole (ARIMIDEX) 1 MG tablet Take 1 tablet (1 mg total) by mouth daily.   . budesonide-formoterol (SYMBICORT) 80-4.5 MCG/ACT inhaler Inhale 2 puffs into the lungs 2 (two) times daily.   . Cholecalciferol (VITAMIN D3) 1000 units CAPS Take 2 capsules by mouth daily.    Marland Kitchen HUMIRA 40 MG/0.8ML PSKT INJECT 0.8 ML (40 MG) UNDER THE SKIN EVERY 14 DAYS   . loratadine (CLARITIN) 10 MG tablet Take 10 mg by mouth daily.   . pantoprazole (PROTONIX) 40 MG tablet TAKE 1 TABLET (40 MG TOTAL) BY MOUTH DAILY BEFORE BREAKFAST.   Marland Kitchen triamcinolone (NASACORT) 55 MCG/ACT AERO nasal inhaler Place 2 sprays into the nose as needed (congestion). 09/06/2017: Using sporadically    Facility-Administered Encounter Medications as of 03/11/2018  Medication  . 0.9 %  sodium chloride infusion      ONCOLOGIC FAMILY HISTORY:  Family History  Problem Relation Age of Onset  . Asthma Father   . Heart disease Father 52       died of MI mid 09/01/22  . Hypertension Sister   . Asthma Sister   . Hypertension Mother   . Aneurysm Mother        brain  . Alzheimer's disease Mother   . Heart disease Mother   . Cancer Neg Hx   . Stroke Neg Hx   . Diabetes Neg Hx   . Colon cancer Neg Hx   . Colon polyps Neg Hx   . Crohn's disease Neg Hx   . Ulcerative colitis Neg Hx      GENETIC COUNSELING/TESTING: Not at this time  SOCIAL HISTORY:  Social History   Socioeconomic History  . Marital status: Divorced    Spouse name: Not on file  . Number of  children: 2  . Years of education: Not on file  . Highest education level: Not on file  Occupational History  . Occupation: Scientist, research (medical)  Social Needs  . Financial resource strain: Not on file  . Food insecurity:    Worry: Not on file    Inability: Not on file  . Transportation needs:    Medical: Not on file    Non-medical: Not on file  Tobacco Use  . Smoking status: Former Smoker    Types: Cigarettes    Last attempt to quit: 06/15/1988    Years since quitting: 29.7  . Smokeless tobacco: Never Used  Substance and Sexual Activity  . Alcohol use: Yes    Alcohol/week: 0.0 standard drinks    Comment: rarely  . Drug use: No  . Sexual activity: Not on file  Lifestyle  . Physical activity:    Days per week: Not on file    Minutes per session: Not on file  . Stress: Not on file  Relationships  . Social connections:    Talks on phone: Not on file    Gets together: Not on file    Attends religious service: Not on file    Active member of club or organization: Not on file    Attends meetings of clubs or organizations: Not on file    Relationship status: Not on file  . Intimate partner violence:    Fear of current or ex partner: Not on file    Emotionally abused: Not on file    Physically abused: Not on file    Forced sexual  activity: Not on file  Other Topics Concern  . Not on file  Social History Narrative   Lives with her son, has 2 adult children, not married, no exercise, active with walking on the job at the [place] [employer].   04/2017      PHYSICAL EXAMINATION:  Vital Signs:   Vitals:   03/11/18 1337  BP: (!) 146/91  Pulse: 77  Resp: 18  Temp: 98.6 F (37 C)  SpO2: 100%   Filed Weights   03/11/18 1337  Weight: 238 lb 6.4 oz (108.1 kg)   General: Well-nourished, well-appearing female in no acute distress.  She is unaccompanied today.   HEENT: Head is normocephalic.  Pupils equal and reactive to light. Conjunctivae clear without exudate.  Sclerae anicteric. Oral mucosa is pink, moist.  Oropharynx is pink without lesions or erythema.  Lymph: No cervical, supraclavicular, or infraclavicular lymphadenopathy noted on palpation.  Cardiovascular: Regular rate and rhythm.Marland Kitchen Respiratory: Clear to auscultation bilaterally. Chest expansion symmetric; breathing non-labored.  Breast: left breast nodule at 3 oclock, about 6cmfn, patient states is new, right breast benign GI: Abdomen soft and round; non-tender, non-distended. Bowel sounds normoactive.  GU: Deferred.  Neuro: No focal deficits. Steady gait.  Psych: Mood and affect normal and appropriate for situation.  Extremities: No edema. MSK: No focal spinal tenderness to palpation.  Full range of motion in bilateral upper extremities Skin: Warm and dry.  LABORATORY DATA:  None for this visit.  DIAGNOSTIC IMAGING:  None for this visit.      ASSESSMENT AND PLAN:  Katie Robinson is a pleasant 62 y.o. 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  She presents to the Survivorship Clinic for our initial meeting and routine follow-up post-completion of treatment for breast cancer.  1.  Stage 0 left breast cancer:  Katie Robinson is continuing to recover from definitive treatment for breast cancer. She will follow-up with her medical oncologist, Dr. Jana Hakim with history and physical exam per surveillance protocol.  She will continue her anti-estrogen therapy with Anastrozole. Thus far, she is tolerating the Anastrozole well, with minimal side effects.  Today, a comprehensive survivorship care plan and treatment summary was reviewed with the patient today detailing her breast cancer diagnosis, treatment course, potential late/long-term effects of treatment, appropriate follow-up care with recommendations for the future, and patient education resources.  A copy of this summary, along with a letter will be sent to the patient's primary care provider via mail/fax/In Basket message after today's visit.    2. Bone health:  Given Katie Robinson age/history of breast cancer and her current treatment regimen including anti-estrogen therapy with Anastrozole, she is at risk for bone demineralization.  Her last DEXA scan was  On 12/27/2017 and demonstrates a T score of -0.7 which is normal.  I reviewed that while taking Anastrozole we will need to check her bone density every 2 years.  In the meantime, she was encouraged to increase her consumption of foods rich in calcium, as well as increase her weight-bearing activities.  She was given education on specific activities to promote bone health.  3. Cancer screening:  Due to Katie Robinson's history and her age, she should receive screening for skin cancers, colon cancer, and gynecologic cancers.  The information and recommendations are listed on the patient's comprehensive care plan/treatment summary and were reviewed in detail with the patient.    4. Health maintenance and wellness promotion: Katie Robinson was encouraged to consume 5-7 servings of fruits and vegetables per day. We reviewed the "Nutrition Rainbow" handout, as well as the handout "Take Control of Your  Health and Reduce Your Cancer Risk" from the [employer].  She was also encouraged to engage in moderate to vigorous exercise for 30 minutes per day most days of the week. We discussed the [employer] fitness program, which is designed for cancer survivors to help them become more physically fit after cancer treatments.  She was instructed to limit her alcohol consumption and continue to abstain from tobacco use.     5. Support services/counseling: It is not uncommon for this period of the patient's cancer care trajectory to be one of many emotions and stressors.  We discussed an opportunity for her to participate in the next session of [employer] ("Finding Your New Normal") support group series designed for patients after they have completed treatment.   Katie Robinson was encouraged to take advantage of our many other support services programs, support groups, and/or counseling in coping with her new life as a cancer survivor after completing anti-cancer treatment.  She was given information regarding our available services and encouraged to contact me with any questions or for help enrolling in any of our support group/programs.   6. New breast lump: left breast mammo and ultrasound  Dispo:   -Return to cancer center for f/u with Dr. Jana Hakim in 11/2018 -Mammogram due in 05/2018 -Follow up with Dr. Donne Hazel in 10-05/2018 -She is welcome to return back to the Survivorship Clinic at any time; no additional follow-up needed at this time.  -Consider referral back to survivorship as a long-term survivor for continued surveillance  A total of (30) minutes of face-to-face time was spent with this patient with greater than 50% of that time in counseling and care-coordination.  Gardenia Phlegm, NP Survivorship Program [facility] [phone]   Note: PRIMARY CARE PROVIDER Carlena Hurl, [place] [phone]

## 2018-03-14 ENCOUNTER — Other Ambulatory Visit: Payer: Self-pay | Admitting: Adult Health

## 2018-03-14 DIAGNOSIS — N63 Unspecified lump in unspecified breast: Secondary | ICD-10-CM

## 2018-04-08 ENCOUNTER — Telehealth: Payer: Self-pay | Admitting: Medical

## 2018-04-08 NOTE — Telephone Encounter (Signed)
Pt called and is wanting to know if she can have the shingrix shot states her GI dr told her she could not have it because it was a live shot, she is wanting you to check on that and see what you think so she can get that shot if possible pt can be reached at [phone] pt is coming in for a cpe November the 27th

## 2018-04-11 ENCOUNTER — Ambulatory Visit
Admission: RE | Admit: 2018-04-11 | Discharge: 2018-04-11 | Disposition: A | Discharge status: Home / Self Care | Payer: POS | Source: Ambulatory Visit | Attending: Adult Health | Admitting: Adult Health

## 2018-04-11 DIAGNOSIS — N63 Unspecified lump in unspecified breast: Secondary | ICD-10-CM

## 2018-04-11 HISTORY — DX: Personal history of irradiation: Z92.3

## 2018-04-11 NOTE — Telephone Encounter (Signed)
The Shingrix vaccine is not a live virus vaccine.  However when someone is on a medicine like Humira, she can still get the vaccine but she may not get a strong response with the vaccine immunity compared to as if she was not on Humira . Thus, if she is going to continue on Humira for the foreseeable future, she can go ahead and do the vaccine if insurance covers it.  She can also discuss this with the doctor that prescribes her Humira.

## 2018-04-12 NOTE — Telephone Encounter (Signed)
Patient has been informed of providers note.

## 2018-04-12 NOTE — Telephone Encounter (Signed)
Left message on voicemail for patient to call back. 

## 2018-04-21 ENCOUNTER — Other Ambulatory Visit (INDEPENDENT_AMBULATORY_CARE_PROVIDER_SITE_OTHER): Payer: 59

## 2018-04-21 DIAGNOSIS — Z23 Encounter for immunization: Secondary | ICD-10-CM

## 2018-05-11 ENCOUNTER — Ambulatory Visit (INDEPENDENT_AMBULATORY_CARE_PROVIDER_SITE_OTHER): Payer: 59 | Admitting: Medical

## 2018-05-11 ENCOUNTER — Encounter: Payer: Self-pay | Admitting: Medical

## 2018-05-11 VITALS — BP 132/92 | HR 80 | Ht 71.25 in | Wt 235.6 lb

## 2018-05-11 DIAGNOSIS — Z7960 Long term (current) use of unspecified immunomodulators and immunosuppressants: Secondary | ICD-10-CM

## 2018-05-11 DIAGNOSIS — E559 Vitamin D deficiency, unspecified: Secondary | ICD-10-CM

## 2018-05-11 DIAGNOSIS — Z7189 Other specified counseling: Secondary | ICD-10-CM

## 2018-05-11 DIAGNOSIS — Z853 Personal history of malignant neoplasm of breast: Secondary | ICD-10-CM | POA: Insufficient documentation

## 2018-05-11 DIAGNOSIS — J4530 Mild persistent asthma, uncomplicated: Secondary | ICD-10-CM | POA: Diagnosis not present

## 2018-05-11 DIAGNOSIS — R030 Elevated blood-pressure reading, without diagnosis of hypertension: Secondary | ICD-10-CM

## 2018-05-11 DIAGNOSIS — J301 Allergic rhinitis due to pollen: Secondary | ICD-10-CM

## 2018-05-11 DIAGNOSIS — Z86010 Personal history of colonic polyps: Secondary | ICD-10-CM | POA: Diagnosis not present

## 2018-05-11 DIAGNOSIS — Z0000 Encounter for general adult medical examination without abnormal findings: Secondary | ICD-10-CM | POA: Diagnosis not present

## 2018-05-11 DIAGNOSIS — Z79899 Other long term (current) drug therapy: Secondary | ICD-10-CM

## 2018-05-11 DIAGNOSIS — R3129 Other microscopic hematuria: Secondary | ICD-10-CM

## 2018-05-11 DIAGNOSIS — R222 Localized swelling, mass and lump, trunk: Secondary | ICD-10-CM | POA: Insufficient documentation

## 2018-05-11 DIAGNOSIS — Z1239 Encounter for other screening for malignant neoplasm of breast: Secondary | ICD-10-CM

## 2018-05-11 DIAGNOSIS — Z7185 Encounter for immunization safety counseling: Secondary | ICD-10-CM | POA: Insufficient documentation

## 2018-05-11 LAB — POCT URINALYSIS DIP (PROADVANTAGE DEVICE)
BILIRUBIN UA: NEGATIVE
BILIRUBIN UA: NEGATIVE mg/dL
Glucose, UA: NEGATIVE mg/dL
Leukocytes, UA: NEGATIVE
Nitrite, UA: NEGATIVE
PH UA: 6 (ref 5.0–8.0)
Protein Ur, POC: NEGATIVE mg/dL
RBC UA: NEGATIVE
Specific Gravity, Urine: 1.01
Urobilinogen, Ur: 3.5

## 2018-05-11 NOTE — Progress Notes (Signed)
Subjective: Chief Complaint  Patient presents with  . Annual Exam   Medical team: Dr. Lurline Del, medical oncology Dr. Eppie Gibson, radiation oncology Dr. Silvano Rusk, GI Dr. Clement Husbands Eye doctor Dentist Dwight Adamczak, Camelia Eng, PA-C for primary care  Concerns:  Saw dentist recently, had hygiene visit yesterday, has appt for tooth extraction soon as well, possible implant.  In past year, diagnosed with breast cancer 05/2017, left breast, had lumpectomy and radiation therapy.   Has recent diagnostic mammogram due to pain in left breast.   Due for regular mammogram now.  Exercise - 6 days per week walking  Asthma - hasn't had to use albuterol lately, maybe once or twice per month  Working for [employer]  Monday night was lying down on left side, felt something weird like a cord in left abdomen, feels something like a lump in upper abdomen on left  Sometimes gets pain down right leg.   Sometimes has back pain.  Had injection in SI joint recently, Dr. Mina Marble  She is status post hysterectomy and bilateral salpingo-oophorectomy  Past Medical History:  Diagnosis Date  . Allergy    SEASONAL  . Anemia   . Arthralgia   . Asthma    controlled  . Breast cancer, left breast ([place]) 05/2017   left breast cancer  . Chronic headache    many years  . Family history of premature coronary artery disease    father  . Former smoker    10 pack year history  . GERD (gastroesophageal reflux disease)    mild, occasional  . H/O bone density study 11/2015   normal  . H/O cardiovascular stress test 2009  . History of radiation therapy 08/12/17- 09/08/17   Left Breast 40.05 Gy in 15 fractions, Left Breast Boost/ 10 Gy in 5 fractions.   Marland Kitchen Hx of adenomatous and sessile serrated colonic polyps 05/24/2014  . Iron deficiency 11/29/2015  . Obesity   . Pectus excavatum    mild, left asymmetric  . Personal history of radiation therapy   . S/P hysterectomy    due to fibroids  .  Seasonal allergic rhinitis   . UC (ulcerative colitis) ([place]) 2015   Dr. Carlean Purl  . Urinary tract infection 2013  . UTI due to extended-spectrum beta lactamase (ESBL) producing Escherichia coli 09/20/2017  . Vitamin D deficiency 07/18/2014  . Wears glasses     Past Surgical History:  Procedure Laterality Date  . BREAST LUMPECTOMY Left 06/2017  . BREAST LUMPECTOMY WITH RADIOACTIVE SEED AND SENTINEL LYMPH NODE BIOPSY Left 06/29/2017   Procedure: LEFT BREAST LUMPECTOMY WITH RADIOACTIVE SEED AND LEFT AXILLARY SENTINEL LYMPH NODE BIOPSY ERAS PATHWAY;  Surgeon: Rolm Bookbinder, MD;  Location: [facility];  Service: General;  Laterality: Left;  . COLONOSCOPY  02/2016   polyps; Dr. Silvano Rusk  . EVACUATION BREAST HEMATOMA Left 07/11/2017   Procedure: EVACUATION HEMATOMA BREAST;  Surgeon: Leighton Ruff, MD;  Location: [facility];  Service: General;  Laterality: Left;  . FOOT TENDON SURGERY Left 2013  . NAVEL REMOVED     AS A CHILD  . OTHER SURGICAL HISTORY     biopsy for possible sarcoidosis  . POLYPECTOMY    . SKIN BIOPSY    . TONSILLECTOMY    . TOTAL ABDOMINAL HYSTERECTOMY  2002   total; Dr. Molli Posey  . WISDOM TOOTH EXTRACTION      Social History   Socioeconomic History  . Marital status: Divorced    Spouse name:  Not on file  . Number of children: 2  . Years of education: Not on file  . Highest education level: Not on file  Occupational History  . Occupation: Scientist, research (medical)  Social Needs  . Financial resource strain: Not on file  . Food insecurity:    Worry: Not on file    Inability: Not on file  . Transportation needs:    Medical: Not on file    Non-medical: Not on file  Tobacco Use  . Smoking status: Former Smoker    Types: Cigarettes    Last attempt to quit: 06/15/1988    Years since quitting: 29.9  . Smokeless tobacco: Never Used  Substance and Sexual Activity  . Alcohol use: Yes    Alcohol/week: 0.0 standard drinks    Comment: rarely  . Drug use:  No  . Sexual activity: Not on file  Lifestyle  . Physical activity:    Days per week: Not on file    Minutes per session: Not on file  . Stress: Not on file  Relationships  . Social connections:    Talks on phone: Not on file    Gets together: Not on file    Attends religious service: Not on file    Active member of club or organization: Not on file    Attends meetings of clubs or organizations: Not on file    Relationship status: Not on file  . Intimate partner violence:    Fear of current or ex partner: Not on file    Emotionally abused: Not on file    Physically abused: Not on file    Forced sexual activity: Not on file  Other Topics Concern  . Not on file  Social History Narrative   Lives with her son, has 2 adult children, not married, no exercise, active with walking on the job at the [place] [employer].   04/2018    Family History  Problem Relation Age of Onset  . Asthma Father   . Heart disease Father 42       died of MI mid Aug 28, 2022  . Hypertension Sister   . Asthma Sister   . Hypertension Mother   . Aneurysm Mother        brain  . Alzheimer's disease Mother   . Heart disease Mother   . Cancer Neg Hx   . Stroke Neg Hx   . Diabetes Neg Hx   . Colon cancer Neg Hx   . Colon polyps Neg Hx   . Crohn's disease Neg Hx   . Ulcerative colitis Neg Hx      Current Outpatient Medications:  .  albuterol (PROVENTIL HFA;VENTOLIN HFA) 108 (90 Base) MCG/ACT inhaler, Inhale 2 puffs into the lungs every 6 (six) hours as needed for wheezing or shortness of breath., Disp: 18 g, Rfl: 1 .  anastrozole (ARIMIDEX) 1 MG tablet, Take 1 tablet (1 mg total) by mouth daily., Disp: 90 tablet, Rfl: 4 .  Cholecalciferol (VITAMIN D3) 1000 units CAPS, Take 2 capsules by mouth daily. , Disp: , Rfl:  .  HUMIRA 40 MG/0.8ML PSKT, INJECT 0.8 ML (40 MG) UNDER THE SKIN EVERY 14 DAYS, Disp: 2 each, Rfl: 6 .  pantoprazole (PROTONIX) 40 MG tablet, TAKE 1 TABLET (40 MG TOTAL) BY MOUTH DAILY BEFORE  BREAKFAST. (Patient not taking: Reported on 05/11/2018), Disp: 90 tablet, Rfl: 0  Current Facility-Administered Medications:  .  0.9 %  sodium chloride infusion, 500 mL, Intravenous, Once, Carlean Purl Ofilia Neas, MD  No Known Allergies    Objective: BP (!) 132/92 (BP Location: Right Arm, Patient Position: Sitting, Cuff Size: Large)   Pulse 80   Ht 5' 11.25" (1.81 m)   Wt 235 lb 9.6 oz (106.9 kg)   BMI 32.63 kg/m   Wt Readings from Last 3 Encounters:  05/11/18 235 lb 9.6 oz (106.9 kg)  03/11/18 238 lb 6.4 oz (108.1 kg)  12/06/17 244 lb 6.4 oz (110.9 kg)    General appearance: alert, no distress, WD/WN, AA female HEENT: normocephalic, sclerae anicteric, PERRLA, EOMi, nares patent, no discharge or erythema, pharynx normal Oral cavity: MMM, no lesions Neck: supple, no lymphadenopathy, no thyromegaly, no masses, no bruits Heart: RRR, normal S1, S2, no murmurs Lungs: CTA bilaterally, no wheezes, rhonchi, or rales Abdomen: +bs, soft, non tender, non distended, no masses, no hepatomegaly, no splenomegaly Back: non tender Musculoskeletal: nontender, no swelling, no obvious deformity Extremities: no edema, no cyanosis, no clubbing Pulses: 2+ symmetric, upper and lower extremities, normal cap refill Neurological: alert, oriented x 3, CN2-12 intact, strength normal upper extremities and lower extremities, sensation normal throughout, DTRs 2+ throughout, no cerebellar signs, gait normal Psychiatric: normal affect, behavior normal, pleasant   Breast: Left breast at the 8 o'clock position with generalized induration and seems somewhat swollen but she notes this is unchanged since about a year ago when she had the initial breast surgery, there is a 1 cm roundish lump on the left breast at 3:00, and there is an unusual linear density approximately 4 to 5 cm long in the somewhat left central chest wall inferior to the breast but no fluctuance no swelling and induration of this linear finding.  Otherwise  breast nontender, no masses or lumps, no skin changes, no nipple discharge or inversion, no axillary lymphadenopathy Gyn: Normal external genitalia without lesions, vagina with normal mucosa, s/p hysterectomy, no abnormal vaginal discharge.  nontender, no masses.  Exam chaperoned by nurse. Rectal: deferred   Adult ECG Report  Indication: elevated BP, physical  Rate: 72 bpm  Rhythm: normal sinus rhythm  QRS Axis: 23 degrees  PR Interval: 170m  QRS Duration: 1031m QTc: 46668mConduction Disturbances: incomplete RBBB  Other Abnormalities: none  Patient's cardiac risk factors are: none.  EKG comparison: none  Narrative Interpretation: incomplete RBBB     Assessment: Encounter Diagnoses  Name Primary?  . Routine check-up Yes  . Allergic rhinitis due to pollen, unspecified seasonality   . Mild persistent asthma without complication   . Hx of adenomatous and sessile serrated colonic polyps   . Long-term use of immunosuppressant medication - Humira   . Vitamin D deficiency   . History of breast cancer   . Screening for breast cancer   . Elevated blood-pressure reading, without diagnosis of hypertension   . Lump in chest   . Vaccine counseling   . Microscopic hematuria      Plan: Discussed diet, exercise, routine preventative care, vaccines, cancer screenings.  Vaccines Up-to-date on flu pneumonia and Tdap vaccines. Shingles vaccine:  I recommend you have a shingles vaccine to help prevent shingles or herpes zoster outbreak.   Please call your insurer to inquire about coverage for the Shingrix vaccine given in 2 doses.   Some insurers cover this vaccine after age 62,57ome cover this after age 85.36If your insurer covers this, then call to schedule appointment to have this vaccine here.  Cancer screening Up-to-date on colonoscopy, reviewed 11/2017 colonoscopy No Pap smear needed, status post bilaterally  salpingo-oophorectomy and hysterectomy, pelvic exam today done She will  follow-up for mammogram in December as planned She is status post left breast cancer diagnosed this past year, subsequent lumpectomy  Advise she see her eye doctor and dentist yearly  Routine labs today  She initially had red cells on urinalysis but under the microscope there was no red cells, false positive  Elevated blood pressures - there are a few different elevated blood pressure readings starting back in June 2019 for now.  We discussed that she may have a new diagnosis of hypertension.  Follow-up pending labs  Asthma-mild, continue albuterol as needed, PFT reviewed  Lump in chest - unclear etiology.  I had supervising physician Dr. Redmond School come in and examined the patient as well.  We will defer this to Dr. Jana Hakim, oncology  [place] was seen today for annual exam.  Diagnoses and all orders for this visit:  Routine check-up -     POCT Urinalysis DIP (Proadvantage Device) -     CBC with Differential/Platelet -     Comprehensive metabolic panel -     Lipid panel -     VITAMIN D 25 Hydroxy (Vit-D Deficiency, Fractures) -     EKG 12-Lead  Allergic rhinitis due to pollen, unspecified seasonality  Mild persistent asthma without complication -     Spirometry with graph  Hx of adenomatous and sessile serrated colonic polyps  Long-term use of immunosuppressant medication - Humira  Vitamin D deficiency -     VITAMIN D 25 Hydroxy (Vit-D Deficiency, Fractures)  History of breast cancer  Screening for breast cancer  Elevated blood-pressure reading, without diagnosis of hypertension -     EKG 12-Lead  Lump in chest  Vaccine counseling  Microscopic hematuria   F/u pending labs

## 2018-05-12 LAB — CBC WITH DIFFERENTIAL/PLATELET
BASOS ABS: 0.1 10*3/uL (ref 0.0–0.2)
Basos: 1 %
EOS (ABSOLUTE): 0.3 10*3/uL (ref 0.0–0.4)
EOS: 4 %
Hematocrit: 39.4 % (ref 34.0–46.6)
Hemoglobin: 13.4 g/dL (ref 11.1–15.9)
Immature Grans (Abs): 0 10*3/uL (ref 0.0–0.1)
Immature Granulocytes: 0 %
LYMPHS ABS: 4 10*3/uL — AB (ref 0.7–3.1)
Lymphs: 50 %
MCH: 28.3 pg (ref 26.6–33.0)
MCHC: 34 g/dL (ref 31.5–35.7)
MCV: 83 fL (ref 79–97)
MONOS ABS: 0.6 10*3/uL (ref 0.1–0.9)
Monocytes: 8 %
NEUTROS PCT: 37 %
Neutrophils Absolute: 2.8 10*3/uL (ref 1.4–7.0)
PLATELETS: 259 10*3/uL (ref 150–450)
RBC: 4.73 x10E6/uL (ref 3.77–5.28)
RDW: 12.9 % (ref 12.3–15.4)
WBC: 7.7 10*3/uL (ref 3.4–10.8)

## 2018-05-12 LAB — COMPREHENSIVE METABOLIC PANEL
ALK PHOS: 170 IU/L — AB (ref 39–117)
ALT: 14 IU/L (ref 0–32)
AST: 17 IU/L (ref 0–40)
Albumin/Globulin Ratio: 1.9 (ref 1.2–2.2)
Albumin: 4.3 g/dL (ref 3.6–4.8)
BUN/Creatinine Ratio: 11 — ABNORMAL LOW (ref 12–28)
BUN: 8 mg/dL (ref 8–27)
Bilirubin Total: 0.4 mg/dL (ref 0.0–1.2)
CALCIUM: 10.5 mg/dL — AB (ref 8.7–10.3)
CO2: 22 mmol/L (ref 20–29)
Chloride: 104 mmol/L (ref 96–106)
Creatinine, Ser: 0.72 mg/dL (ref 0.57–1.00)
GFR calc Af Amer: 105 mL/min/{1.73_m2} (ref >59)
GFR, EST NON AFRICAN AMERICAN: 91 mL/min/{1.73_m2} (ref >59)
GLOBULIN, TOTAL: 2.3 g/dL (ref 1.5–4.5)
Glucose: 81 mg/dL (ref 65–99)
Potassium: 4.4 mmol/L (ref 3.5–5.2)
SODIUM: 141 mmol/L (ref 134–144)
Total Protein: 6.6 g/dL (ref 6.0–8.5)

## 2018-05-12 LAB — LIPID PANEL
CHOL/HDL RATIO: 2.4 ratio (ref 0.0–4.4)
CHOLESTEROL TOTAL: 225 mg/dL — AB (ref 100–199)
HDL: 92 mg/dL (ref >39)
LDL Calculated: 116 mg/dL — ABNORMAL HIGH (ref 0–99)
TRIGLYCERIDES: 87 mg/dL (ref 0–149)
VLDL Cholesterol Cal: 17 mg/dL (ref 5–40)

## 2018-05-12 LAB — VITAMIN D 25 HYDROXY (VIT D DEFICIENCY, FRACTURES): Vit D, 25-Hydroxy: 33.1 ng/mL (ref 30.0–100.0)

## 2018-05-16 ENCOUNTER — Other Ambulatory Visit: Payer: Self-pay | Admitting: Medical

## 2018-05-16 ENCOUNTER — Telehealth: Payer: Self-pay | Admitting: Oncology

## 2018-05-16 DIAGNOSIS — R748 Abnormal levels of other serum enzymes: Secondary | ICD-10-CM

## 2018-05-16 MED ORDER — AMLODIPINE BESYLATE 5 MG PO TABS: 5.0000 mg | ORAL_TABLET | Freq: Every day | ORAL | 2 refills | Status: DC

## 2018-05-16 NOTE — Telephone Encounter (Signed)
Scheduled appt per 12/2 sch message - left message for pt with appt date and time. \ And call back number if pt needs appt rescheduled . \

## 2018-05-18 ENCOUNTER — Other Ambulatory Visit: Payer: 59

## 2018-05-18 DIAGNOSIS — R748 Abnormal levels of other serum enzymes: Secondary | ICD-10-CM

## 2018-05-20 ENCOUNTER — Other Ambulatory Visit: Payer: Self-pay | Admitting: Medical

## 2018-05-20 LAB — PROTEIN ELECTROPHORESIS, SERUM
A/G Ratio: 1.2 (ref 0.7–1.7)
Albumin ELP: 3.7 g/dL (ref 2.9–4.4)
Alpha 1: 0.2 g/dL (ref 0.0–0.4)
Alpha 2: 0.6 g/dL (ref 0.4–1.0)
Beta: 1.3 g/dL (ref 0.7–1.3)
GAMMA GLOBULIN: 1.1 g/dL (ref 0.4–1.8)
Globulin, Total: 3.2 g/dL (ref 2.2–3.9)
Total Protein: 6.9 g/dL (ref 6.0–8.5)

## 2018-05-20 LAB — ALKALINE PHOSPHATASE, ISOENZYMES
Alkaline Phosphatase: 184 IU/L — ABNORMAL HIGH (ref 39–117)
BONE FRACTION: 65 % (ref 14–68)
INTESTINAL FRAC.: 0 % (ref 0–18)
LIVER FRACTION: 35 % (ref 18–85)

## 2018-05-20 MED ORDER — VITAMIN D 25 MCG (1000 UNIT) PO TABS: 1000.0000 [IU] | ORAL_TABLET | Freq: Every day | ORAL | 3 refills | Status: DC

## 2018-06-09 ENCOUNTER — Ambulatory Visit: Admission: RE | Admit: 2018-06-09 | Payer: POS | Source: Ambulatory Visit

## 2018-06-09 ENCOUNTER — Ambulatory Visit
Admission: RE | Admit: 2018-06-09 | Discharge: 2018-06-09 | Disposition: A | Discharge status: Home / Self Care | Payer: POS | Source: Ambulatory Visit | Attending: Adult Health | Admitting: Adult Health

## 2018-06-09 DIAGNOSIS — D0512 Intraductal carcinoma in situ of left breast: Secondary | ICD-10-CM

## 2018-06-11 NOTE — Progress Notes (Signed)
[facility]  [phone] [phone]     ID: Tmya Wigington [facility] DOB: 05-Aug-1961  MR#: [id number]  SFK#:[id number]  Patient Care Team: Caryl Ada as PCP - General (Family Medicine) Shelena Castelluccio, Virgie Dad, MD as Consulting Physician (Oncology) Gatha Mayer, MD as Consulting Physician (Gastroenterology) Eppie Gibson, MD as Attending Physician (Radiation Oncology) Rolm Bookbinder, MD as Consulting Physician (General Surgery) Delice Bison, Charlestine Massed, NP as Nurse Practitioner (Hematology and Oncology) OTHER MD:  CHIEF COMPLAINT: DCIS estrogen receptor positive  CURRENT TREATMENT: Anastrozole   HISTORY OF CURRENT ILLNESS: Aithana Kushner Marcoux had routine screening mammography on 06/07/2017 showing a possible abnormality in the left breast. She underwent unilateral left breast diagnostic mammography with tomography and left axilla ultrasonography on 06/17/2017 at [place] on 06/10/2017 showing: Suspicious, grouped left breast calcifications. Negative for left axillary lymphadenopathy.  Accordingly on 06/14/2017 she proceeded to biopsy of the left breast area in question. 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 Focus Highly suspicious for stromal invasion. Prognostic indicators significant for: estrogen receptor, 50% positive with moderate staining intensity and progesterone receptor, 0% negative.  The patient's subsequent history is as detailed below.  INTERVAL HISTORY: Kimla returns today for follow-up of her estrogen receptor positive breast cancer.   The patient continues on anastrozole. She experiences hot flashes, but she states that they are not too severe. She is not experiencing vaginal dryness. She states that she has developed more facial hair, which she attributes to the anastrozole treatment.   Tanzie's last  bone density screening on 12/27/2017, showed a T-score of -0.7, which is considered normal.    Since her last visit here on 12/06/2017, she underwent digital diagnostic left mammography with tomography and a left breast ultrasound on 04/11/2018 showing Breast Density Category C. There are lumpectomy changes with 2 surgical clips in the medial left breast. There are increased trabecular markings throughout the left breast and diffuse skin thickening, consistent with interval radiation therapy. Surgical clips are seen in the inferior left axilla. No discrete mass or suspicious microcalcification is identified in the left breast. No nonsurgical distortion is identified. On physical exam, I do palpate focal fullness in the left breast in the 2:30 position approximately 9 cm from the nipple in the region of patient concern. This is within a few cm of the patient's well-healed axillary surgical scar.Targeted ultrasound is performed, showing probable focal cystic changes, possibly fat necrosis, in the 2:30 position of the left breast 9 cm from the nipple. There is a circumscribed and gently lobulated cystic mass with well-defined posterior walls and posterior acoustic enhancement that measures approximately 1.2 x 1.0 x 0.5 cm. There are internal echoes within the cyst. While there is an adjacent vessel, there is no internal vascular flow. Normal left axillary lymph nodes are imaged.  On 06/09/2018, she underwent a digital diagnostic bilateral mammogram with tomography showing Breast density Category B. Lumpectomy changes are seen in the left breast. No suspicious mass or malignant type microcalcifications identified in either breast.   REVIEW OF SYSTEMS: For exercise, Judson Roch has been walking; she walks at work, usually on her breaks for about 30 minutes each day she works. The patient denies unusual headaches, visual changes, nausea, vomiting, or dizziness. There has been no unusual cough, phlegm production, or  pleurisy. This been no change in bowel or bladder habits. The patient denies unexplained fatigue or unexplained weight loss,  bleeding, rash, or fever. A detailed review of systems was otherwise noncontributory.    PAST MEDICAL HISTORY: Past Medical History:  Diagnosis Date  . Allergy    SEASONAL  . Anemia   . Arthralgia   . Asthma    controlled  . Breast cancer, left breast ([place]) 05/2017   left breast cancer  . Chronic headache    many years  . Family history of premature coronary artery disease    father  . Former smoker    10 pack year history  . GERD (gastroesophageal reflux disease)    mild, occasional  . H/O bone density study 11/2015   normal  . H/O cardiovascular stress test 08/27/2007  . History of radiation therapy 08/12/17- 09/08/17   Left Breast 40.05 Gy in 15 fractions, Left Breast Boost/ 10 Gy in 5 fractions.   Marland Kitchen Hx of adenomatous and sessile serrated colonic polyps 05/24/2014  . Iron deficiency 11/29/2015  . Obesity   . Pectus excavatum    mild, left asymmetric  . Personal history of radiation therapy   . S/P hysterectomy    due to fibroids  . Seasonal allergic rhinitis   . UC (ulcerative colitis) ([place]) Aug 26, 2013   Dr. Carlean Purl  . Urinary tract infection 2011/08/27  . UTI due to extended-spectrum beta lactamase (ESBL) producing Escherichia coli 09/20/2017  . Vitamin D deficiency 07/18/2014  . Wears glasses     PAST SURGICAL HISTORY: Past Surgical History:  Procedure Laterality Date  . BREAST LUMPECTOMY Left 06/2017  . BREAST LUMPECTOMY WITH RADIOACTIVE SEED AND SENTINEL LYMPH NODE BIOPSY Left 06/29/2017   Procedure: LEFT BREAST LUMPECTOMY WITH RADIOACTIVE SEED AND LEFT AXILLARY SENTINEL LYMPH NODE BIOPSY ERAS PATHWAY;  Surgeon: Rolm Bookbinder, MD;  Location: [facility];  Service: General;  Laterality: Left;  . COLONOSCOPY  02/2016   polyps; Dr. Silvano Rusk  . EVACUATION BREAST HEMATOMA Left 07/11/2017   Procedure: EVACUATION HEMATOMA BREAST;  Surgeon:  Leighton Ruff, MD;  Location: [facility];  Service: General;  Laterality: Left;  . FOOT TENDON SURGERY Left 08/27/11  . NAVEL REMOVED     AS A CHILD  . OTHER SURGICAL HISTORY     biopsy for possible sarcoidosis  . POLYPECTOMY    . SKIN BIOPSY    . TONSILLECTOMY    . TOTAL ABDOMINAL HYSTERECTOMY  08-26-00   total; Dr. Molli Posey  . WISDOM TOOTH EXTRACTION      FAMILY HISTORY Family History  Problem Relation Age of Onset  . Asthma Father   . Heart disease Father 46       died of MI mid 08/26/22  . Hypertension Sister   . Asthma Sister   . Hypertension Mother   . Aneurysm Mother        brain  . Alzheimer's disease Mother   . Heart disease Mother   . Cancer Neg Hx   . Stroke Neg Hx   . Diabetes Neg Hx   . Colon cancer Neg Hx   . Colon polyps Neg Hx   . Crohn's disease Neg Hx   . Ulcerative colitis Neg Hx   Mother passed at due to brain aneurysm and she also had Alzhimer's. Father passed at 29 due to heart attack and asthma. She has 1 living sister with good health. No history with breast or ovarian cancer in the family.  GYNECOLOGIC HISTORY:  No LMP recorded. Patient has had a hysterectomy. Menarche: 62 years old Age at first live birth: 62 years  old GP: GxP2 HRT: Did not use hormone replacement She has a total hysterectomy with bilateral salpingo-oophorectomy in 2002 due to fibroids and a chocolate cyst on her ovaries.   SOCIAL HISTORY:  She works as a Scientist, research (medical). She is divorced. Her son lives with her and is unmarried. He works at [employer] as a Technical brewer. Her daughter is a Chiropractor at [employer]. She has 1 grandchild. She belongs to [employer].    ADVANCED DIRECTIVES: Not in place; at the 06/23/2017 visit the patient was given the appropriate documents to complete on notarized at her discretion   HEALTH MAINTENANCE: Social History   Tobacco Use  . Smoking status: Former Smoker    Types: Cigarettes    Last attempt to quit:  06/15/1988    Years since quitting: 30.0  . Smokeless tobacco: Never Used  Substance Use Topics  . Alcohol use: Yes    Alcohol/week: 0.0 standard drinks    Comment: rarely  . Drug use: No     Colonoscopy: 11/25/2017/ [facility]/ normal  PAP: Status post Hysterectomy/ BSO 2002  Bone density: 12/02/2015 T-score +0.2 normal   No Known Allergies  Current Outpatient Medications  Medication Sig Dispense Refill  . albuterol (PROVENTIL HFA;VENTOLIN HFA) 108 (90 Base) MCG/ACT inhaler Inhale 2 puffs into the lungs every 6 (six) hours as needed for wheezing or shortness of breath. 18 g 1  . amLODipine (NORVASC) 5 MG tablet Take 1 tablet (5 mg total) by mouth daily. 30 tablet 2  . anastrozole (ARIMIDEX) 1 MG tablet Take 1 tablet (1 mg total) by mouth daily. 90 tablet 4  . Cholecalciferol (VITAMIN D3) 1000 units CAPS Take 2 capsules by mouth daily.     Marland Kitchen HUMIRA 40 MG/0.8ML PSKT INJECT 0.8 ML (40 MG) UNDER THE SKIN EVERY 14 DAYS 2 each 6  . pantoprazole (PROTONIX) 40 MG tablet TAKE 1 TABLET (40 MG TOTAL) BY MOUTH DAILY BEFORE BREAKFAST. (Patient not taking: Reported on 05/11/2018) 90 tablet 0   Current Facility-Administered Medications  Medication Dose Route Frequency Provider Last Rate Last Dose  . 0.9 %  sodium chloride infusion  500 mL Intravenous Once Gatha Mayer, MD        OBJECTIVE: Middle-aged African-American woman who appears well  Vitals:   06/13/18 1508  BP: 133/89  Pulse: 77  Resp: 18  Temp: 99 F (37.2 C)  SpO2: 99%     Body mass index is 31.98 kg/m.   Wt Readings from Last 3 Encounters:  06/13/18 230 lb 14.4 oz (104.7 kg)  05/11/18 235 lb 9.6 oz (106.9 kg)  03/11/18 238 lb 6.4 oz (108.1 kg)      ECOG FS:0 - Asymptomatic  Sclerae unicteric, pupils round and equal no cervical or supraclavicular adenopathy Lungs no rales or rhonchi Heart regular rate and rhythm Abd soft, nontender, positive bowel sounds MSK no focal spinal tenderness, no upper extremity  lymphedema Neuro: nonfocal, well oriented, appropriate affect Breasts: The right breast is benign per the left breast is status post lumpectomy and radiation.  There is minimal induration associated with the incisional scar.  There is no evidence of disease recurrence.  Both axillae are benign.  LAB RESULTS:  CMP     Component Value Date/Time   NA 141 05/11/2018 1445   K 4.4 05/11/2018 1445   CL 104 05/11/2018 1445   CO2 22 05/11/2018 1445   GLUCOSE 81 05/11/2018 1445   GLUCOSE 88 07/11/2017 1400   BUN 8  05/11/2018 1445   CREATININE 0.72 05/11/2018 1445   CREATININE 0.59 05/10/2017 0942   CALCIUM 10.5 (H) 05/11/2018 1445   PROT 6.9 05/18/2018 1430   ALBUMIN 4.3 05/11/2018 1445   AST 17 05/11/2018 1445   ALT 14 05/11/2018 1445   ALKPHOS 184 (H) 05/18/2018 1430   BILITOT 0.4 05/11/2018 1445   GFRNONAA 91 05/11/2018 1445   GFRAA 105 05/11/2018 1445    Lab Results  Component Value Date   ALBUMINELP 3.7 05/18/2018   MSPIKE Not Observed 05/18/2018    No results found for: KPAFRELGTCHN, LAMBDASER, KAPLAMBRATIO  Lab Results  Component Value Date   WBC 7.7 05/11/2018   NEUTROABS 2.8 05/11/2018   HGB 13.4 05/11/2018   HCT 39.4 05/11/2018   MCV 83 05/11/2018   PLT 259 05/11/2018    @LASTCHEMISTRY @  No results found for: LABCA2  No components found for: RXVQMG867  No results for input(s): INR in the last 168 hours.  No results found for: LABCA2  No results found for: YPP509  No results found for: TOI712  No results found for: WPY099  No results found for: CA2729  No components found for: HGQUANT  No results found for: CEA1 / No results found for: CEA1   No results found for: AFPTUMOR  No results found for: CHROMOGRNA  No results found for: PSA1  No visits with results within 3 Day(s) from this visit.  Latest known visit with results is:  Appointment on 05/18/2018  Component Date Value Ref Range Status  . Total Protein 05/18/2018 6.9  6.0 - 8.5 g/dL  Final  . Albumin ELP 05/18/2018 3.7  2.9 - 4.4 g/dL Final  . Alpha 1 05/18/2018 0.2  0.0 - 0.4 g/dL Final  . Alpha 2 05/18/2018 0.6  0.4 - 1.0 g/dL Final  . Beta 05/18/2018 1.3  0.7 - 1.3 g/dL Final  . Gamma Globulin 05/18/2018 1.1  0.4 - 1.8 g/dL Final  . M-Spike, % 05/18/2018 Not Observed  Not Observed g/dL Final  . GLOBULIN, TOTAL 05/18/2018 3.2  2.2 - 3.9 g/dL Final  . A/G Ratio 05/18/2018 1.2  0.7 - 1.7 Final  . Please Note: 05/18/2018 Comment   Final   Comment: Protein electrophoresis scan will follow via computer, mail, or courier delivery.   . Interpretation: 05/18/2018 Comment   Final   Comment: The SPE pattern appears essentially unremarkable. Evidence of monoclonal protein is not apparent.   . Alkaline Phosphatase 05/18/2018 184* 39 - 117 IU/L Final  . LIVER FRACTION 05/18/2018 35  18 - 85 % Final  . BONE FRACTION 05/18/2018 65  14 - 68 % Final  . INTESTINAL FRAC. 05/18/2018 0  0 - 18 % Final    (this displays the last labs from the last 3 days)  Lab Results  Component Value Date   ALBUMINELP 3.7 05/18/2018   MSPIKE Not Observed 05/18/2018   (this displays SPEP labs)  No results found for: KPAFRELGTCHN, LAMBDASER, KAPLAMBRATIO (kappa/lambda light chains)  No results found for: HGBA, HGBA2QUANT, HGBFQUANT, HGBSQUAN (Hemoglobinopathy evaluation)   No results found for: LDH  Lab Results  Component Value Date   IRON 45 04/05/2016   TIBC 141 (L) 04/05/2016   IRONPCTSAT 32 (H) 04/05/2016   (Iron and TIBC)  Lab Results  Component Value Date   FERRITIN 68 04/05/2016    Urinalysis    Component Value Date/Time   COLORURINE YELLOW 09/16/2017 1220   APPEARANCEUR CLEAR 09/16/2017 1220   LABSPEC 1.010 05/11/2018 1402  PHURINE 6.5 09/16/2017 1220   GLUCOSEU NEGATIVE 09/16/2017 1220   HGBUR TRACE-INTACT (A) 09/16/2017 1220   BILIRUBINUR negative 05/11/2018 1402   BILIRUBINUR n 04/29/2015 1218   KETONESUR negative 05/11/2018 1402   KETONESUR NEGATIVE  09/16/2017 1220   PROTEINUR negative 05/11/2018 1402   PROTEINUR NEGATIVE 04/04/2016 1552   UROBILINOGEN 0.2 09/16/2017 1220   NITRITE Negative 05/11/2018 1402   NITRITE NEGATIVE 09/16/2017 1220   LEUKOCYTESUR Negative 05/11/2018 1402     STUDIES: Mm Diag Breast Tomo Bilateral  Result Date: 06/09/2018 CLINICAL DATA:  History of left breast cancer status post lumpectomy in January of 2019. EXAM: DIGITAL DIAGNOSTIC BILATERAL MAMMOGRAM WITH CAD AND TOMO COMPARISON:  Previous exam(s). ACR Breast Density Category b: There are scattered areas of fibroglandular density. FINDINGS: Lumpectomy changes are seen in the left breast. No suspicious mass or malignant type microcalcifications identified in either breast. Mammographic images were processed with CAD. IMPRESSION: No evidence of malignancy in either breast. RECOMMENDATION: Bilateral diagnostic mammogram in 1 year is recommended. I have discussed the findings and recommendations with the patient. Results were also provided in writing at the conclusion of the visit. If applicable, a reminder letter will be sent to the patient regarding the next appointment. BI-RADS CATEGORY  2: Benign. Electronically Signed   By: Lillia Mountain M.D.   On: 06/09/2018 09:56    ELIGIBLE FOR AVAILABLE RESEARCH PROTOCOL: No   ASSESSMENT: 62 y.o. [place] woman status post left breast lower inner quadrant biopsy 06/14/2017 for ductal carcinoma in situ, grade 3, with an area suspicious for microinvasion, estrogen receptor positive progesterone receptor negative  (1) status post left lumpectomy and sentinel lymph node sampling 06/29/2017 showing no residual carcinoma, with clear margins  (a) a total of 5 sentinel lymph nodes were removed  (2) adjuvant radiation 08/12/17 - 09/08/17 Site/dose:    1) Left Breast / 40.05 Gy in 15 fractions 2) Left Breast Boost / 10 Gy in 5 fractions  (3) anastrozole started April 2019  (a) bone density 12/27/2017 normal with a T score of  -0.9   PLAN: Takaya is tolerating anastrozole well and the plan will be to continue that a total of 5 years.  She has a good baseline bone density.  She is getting excellent vitamin D supplementation as she has a very good walking program.  [facility]  Her ulcerative colitis is currently well controlled under Dr. Evern Bio direction.  She will be seeing Dr. Donne Hazel sometime in the middle of 2020.  Accordingly she will return to see me January 2021, after her next mammogram.  She knows to call for any other issues that may develop before that visit.  [place] Jr, Virgie Dad, MD  06/13/18 4:01 PM Medical Oncology and Hematology [facility] [address] [place], [place] [address] Tel. [phone]    Fax. [phone]   I, Jacqualyn Posey am acting as a Education administrator for Chauncey Cruel, MD.   I, Lurline Del MD, have reviewed the above documentation for accuracy and completeness, and I agree with the above.

## 2018-06-13 ENCOUNTER — Inpatient Hospital Stay: Payer: POS | Attending: Oncology | Admitting: Oncology

## 2018-06-13 VITALS — BP 133/89 | HR 77 | Temp 99.0°F | Resp 18 | Wt 230.9 lb

## 2018-06-13 DIAGNOSIS — Z90710 Acquired absence of both cervix and uterus: Secondary | ICD-10-CM | POA: Insufficient documentation

## 2018-06-13 DIAGNOSIS — Z87891 Personal history of nicotine dependence: Secondary | ICD-10-CM | POA: Insufficient documentation

## 2018-06-13 DIAGNOSIS — Z9079 Acquired absence of other genital organ(s): Secondary | ICD-10-CM | POA: Insufficient documentation

## 2018-06-13 DIAGNOSIS — K5120 Ulcerative (chronic) proctitis without complications: Secondary | ICD-10-CM

## 2018-06-13 DIAGNOSIS — Z79899 Other long term (current) drug therapy: Secondary | ICD-10-CM

## 2018-06-13 DIAGNOSIS — Z170 Estrogen receptor positive status [ER+]: Secondary | ICD-10-CM | POA: Insufficient documentation

## 2018-06-13 DIAGNOSIS — Z90722 Acquired absence of ovaries, bilateral: Secondary | ICD-10-CM | POA: Insufficient documentation

## 2018-06-13 DIAGNOSIS — D0512 Intraductal carcinoma in situ of left breast: Secondary | ICD-10-CM

## 2018-06-13 DIAGNOSIS — E559 Vitamin D deficiency, unspecified: Secondary | ICD-10-CM

## 2018-06-13 DIAGNOSIS — Z79811 Long term (current) use of aromatase inhibitors: Secondary | ICD-10-CM | POA: Insufficient documentation

## 2018-06-13 DIAGNOSIS — C50312 Malignant neoplasm of lower-inner quadrant of left female breast: Secondary | ICD-10-CM

## 2018-06-13 MED ORDER — ANASTROZOLE 1 MG PO TABS: 1.0000 mg | ORAL_TABLET | Freq: Every day | ORAL | 4 refills | Status: DC

## 2018-06-24 ENCOUNTER — Ambulatory Visit: Payer: 59 | Admitting: Medical

## 2018-06-24 ENCOUNTER — Encounter: Payer: Self-pay | Admitting: Medical

## 2018-06-24 VITALS — BP 140/90 | HR 78 | Temp 98.3°F | Resp 16 | Ht 72.0 in | Wt 232.4 lb

## 2018-06-24 DIAGNOSIS — Z7189 Other specified counseling: Secondary | ICD-10-CM

## 2018-06-24 DIAGNOSIS — Z7185 Encounter for immunization safety counseling: Secondary | ICD-10-CM

## 2018-06-24 DIAGNOSIS — I10 Essential (primary) hypertension: Secondary | ICD-10-CM | POA: Diagnosis not present

## 2018-06-24 DIAGNOSIS — I808 Phlebitis and thrombophlebitis of other sites: Secondary | ICD-10-CM

## 2018-06-24 NOTE — Patient Instructions (Addendum)
Shingles vaccine:  I recommend you have a shingles vaccine to help prevent shingles or herpes zoster outbreak.   Please call your insurer to inquire about coverage for the Shingrix vaccine given in 2 doses.   Some insurers cover this vaccine after age 62, some cover this after age 54.  If your insurer covers this, then call to schedule appointment to have this vaccine here.  Check blood pressure readings 2 or 3 times per week.  Goal is 12/0/70.   High would be 140/90 or higher  email the name we were discussing, [email or web address]   Diet recommendations"  Breakfast You may eat 1 of the following  Smoothie with Almond milk, handful of kale or spinach, and 1-2 fruit servings of your choice such as berries or 1/2 banana  Whole grain slice of toast and thin layer of low sugar jam or small amount of honey  Whole grain slice of toast and avocado spread  1/2 cup of steel cut oats (oatmeal)   Mid-morning snack 1 fruit serving such as one of the following:  medium-sized apple  medium-sized orange,  Tangerine  1/2 banana   3/4 cup of fresh berries or frozen berries  A protein source such as one of the following:  8 almonds   small handful of walnuts or other nuts  Hummus and vegetable such as carrots   Lunch A protein source such as 1 of the following: . 1 serving of beans such as black beans, pinto beans, green beans, or edamame (soy beans) . Veggie burger  . Non breaded fish such as salmon or tuna, either baked, grilled, or broiled Vegetable - Half of your plate should be a non-starchy vegetables!  So avoid white potatoes and corn.  Otherwise, eat a large portion of vegetables. . Avocado, cucumber, tomato, carrots, greens, lettuce, squash, okra, etc.  . Vegetables can include salad with olive oil/vinaigrette dressing Grains such as 1/2 cup of brown rice, quinoa, barley or other whole grain or 1 or 2 slices of whole grain bread   Mid-afternoon snack 1 fruit serving  such as one of the following:  medium-sized apple  medium-sized orange,  Tangerine  1/2 banana   3/4 cup of fresh berries or frozen berries  A protein source such as one of the following:  8 almonds   small handful of walnuts or other nuts  Hummus and vegetable such as carrots   Dinner A protein source such as 1 of the following: . 1 serving of beans such as black beans, pinto beans, green beans, or edamame (soy beans) . Veggie burger  . Non breaded fish such as salmon or tuna, either baked, grilled, or broiled Vegetable - Half of your plate should be a non-starchy vegetables!  So avoid white potatoes and corn.  Otherwise, eat a large portion of vegetables. . Avocado, cucumber, tomato, carrots, greens, lettuce, squash, okra, etc.  . Vegetables can include salad with olive oil/vinaigrette dressing Grains such as 1/2 cup of brown rice, quinoa, barley or other whole grain or 1 or 2 slices of whole grain bread   Beverages: Water Unsweet tea Home made juice with a juicer without sugar added other than small bit of honey or agave nectar Water with sugar free flavor such as [place]   AVOID.... For the time being I want you to cut out the following items completely: . Soda, sweet tea, juice, beer or wine or alcohol . Sweets such as cake, candy, pies, chips, cookies, chocolate

## 2018-06-24 NOTE — Progress Notes (Addendum)
Subjective:  Katie Robinson is a 63 y.o. female who presents for Chief Complaint  Patient presents with  . follow up    follow up HTN  patient has not started taking the HTN pills     Here for BP f/u.  At her recent physical visit she states she never took the Amlodipine 37m.  Has been walking for exercise, has cut back on salt but BP still not improved.   BP was 130/86 at Oncology in late she has no new c/o.   She reports family history of hypertension in mother and sister.  No other aggravating or relieving factors.    No other c/o.  The following portions of the patient's history were reviewed and updated as appropriate: allergies, current medications, past family history, past medical history, past social history, past surgical history and problem list.  ROS Otherwise as in subjective above  Past Medical History:  Diagnosis Date  . Allergy    SEASONAL  . Anemia   . Arthralgia   . Asthma    controlled  . Breast cancer, left breast ([facility] 05/2017   left breast cancer  . Chronic headache    many years  . Family history of premature coronary artery disease    father  . Former smoker    10 pack year history  . GERD (gastroesophageal reflux disease)    mild, occasional  . H/O bone density study 11/2015   normal  . H/O cardiovascular stress test 2009  . History of radiation therapy 08/12/17- 09/08/17   Left Breast 40.05 Gy in 15 fractions, Left Breast Boost/ 10 Gy in 5 fractions.   .Marland KitchenHx of adenomatous and sessile serrated colonic polyps 05/24/2014  . Iron deficiency 11/29/2015  . Obesity   . Pectus excavatum    mild, left asymmetric  . Personal history of radiation therapy   . S/P hysterectomy    due to fibroids  . Seasonal allergic rhinitis   . [place]ative [place]) [place] [place]   Katie Robinson . Urinary tract infection 2013  . UTI due to extended-spectrum beta lactamase (ESBL) producing Escherichia coli 09/20/2017  . Vitamin D deficiency 07/18/2014  . Wears glasses     Current Outpatient Medications on File Prior to Visit  Medication Sig Dispense Refill  . albuterol (PROVENTIL HFA;VENTOLIN HFA) 108 (90 Base) MCG/ACT inhaler Inhale 2 puffs into the lungs every 6 (six) hours as needed for wheezing or shortness of breath. 18 g 1  . anastrozole (ARIMIDEX) 1 MG tablet Take 1 tablet (1 mg total) by mouth daily. 90 tablet 4  . Cholecalciferol (VITAMIN D3) 1000 units CAPS Take 2 capsules by mouth daily.     .Marland KitchenHUMIRA 40 MG/0.8ML PSKT INJECT 0.8 ML (40 MG) UNDER THE SKIN EVERY 14 DAYS 2 each 6  . amLODipine (NORVASC) 5 MG tablet Take 1 tablet (5 mg total) by mouth daily. (Patient not taking: Reported on 06/24/2018) 30 tablet 2  . pantoprazole (PROTONIX) 40 MG tablet TAKE 1 TABLET (40 MG TOTAL) BY MOUTH DAILY BEFORE BREAKFAST. (Patient not taking: Reported on 05/11/2018) 90 tablet 0   Current Facility-Administered Medications on File Prior to Visit  Medication Dose Route Frequency Provider Last Rate Last Dose  . 0.9 %  sodium chloride infusion  500 mL Intravenous Once GGatha Mayer MD         Objective: BP 140/90   Pulse 78   Temp 98.3 F (36.8 C) (Oral)   Resp 16  Ht 6' (1.829 m)   Wt 232 lb 6.4 oz (105.4 kg)   SpO2 99%   BMI 31.52 kg/m   Wt Readings from Last 3 Encounters:  06/24/18 232 lb 6.4 oz (105.4 kg)  06/13/18 230 lb 14.4 oz (104.7 kg)  05/11/18 235 lb 9.6 oz (106.9 kg)   BP Readings from Last 3 Encounters:  06/24/18 140/90  06/13/18 133/89  05/11/18 (!) 132/92    General appearance: alert, no distress, well developed, well nourished Neck: supple, no lymphadenopathy, no thyromegaly, no masses Heart: RRR, normal S1, S2, no murmurs Lungs: CTA bilaterally, no wheezes, rhonchi, or rales Pulses: 2+ radial pulses, 2+ pedal pulses, normal cap refill Ext: no edema   Assessment: Encounter Diagnoses  Name Primary?  . Essential hypertension Yes  . Vaccine counseling   . Mondor's disease      Plan:  HTN - begin Amlodipine 84m,  discussed risks/benefits, c/t working on healthy diet, exercise, limiting salt.  F/u with BP readings within  3 weeks.  Will need refills for 90 days through mail order if tolerating this.  Shingles vaccine:  I recommend you have a shingles vaccine to help prevent shingles or herpes zoster outbreak.   Please call your insurer to inquire about coverage for the Shingrix vaccine given in 2 doses.   Some insurers cover this vaccine after age 62 some cover this after age 372  If your insurer covers this, then call to schedule appointment to have this vaccine here.   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  Diagnoses and all orders for this visit:  Essential hypertension  Vaccine counseling  Mondor's disease    Follow up: within 3 weeks by phone

## 2018-07-01 ENCOUNTER — Other Ambulatory Visit (INDEPENDENT_AMBULATORY_CARE_PROVIDER_SITE_OTHER): Payer: 59

## 2018-07-01 DIAGNOSIS — Z23 Encounter for immunization: Secondary | ICD-10-CM | POA: Diagnosis not present

## 2018-07-12 ENCOUNTER — Inpatient Hospital Stay: Payer: POS | Admitting: Oncology

## 2018-07-15 ENCOUNTER — Other Ambulatory Visit: Payer: Self-pay

## 2018-07-15 ENCOUNTER — Telehealth: Payer: Self-pay

## 2018-07-15 DIAGNOSIS — I10 Essential (primary) hypertension: Secondary | ICD-10-CM

## 2018-07-15 MED ORDER — AMLODIPINE BESYLATE 5 MG PO TABS: 5.0000 mg | ORAL_TABLET | Freq: Every day | ORAL | 1 refills | Status: DC

## 2018-07-15 NOTE — Telephone Encounter (Signed)
Patient called and left a message stating she needs her Amlodipine sent to mail order.

## 2018-07-15 NOTE — Telephone Encounter (Signed)
Done

## 2018-07-25 ENCOUNTER — Other Ambulatory Visit: Payer: Self-pay | Admitting: Internal Medicine

## 2018-07-25 ENCOUNTER — Telehealth: Payer: Self-pay | Admitting: Internal Medicine

## 2018-07-25 ENCOUNTER — Encounter: Payer: Self-pay | Admitting: Internal Medicine

## 2018-07-25 MED ORDER — ADALIMUMAB 40 MG/0.8ML ~~LOC~~ PSKT: 40.0000 mg | PREFILLED_SYRINGE | SUBCUTANEOUS | 4 refills | Status: DC

## 2018-07-25 NOTE — Telephone Encounter (Signed)
rx sent

## 2018-07-25 NOTE — Telephone Encounter (Signed)
This encounter was created in error - please disregard.

## 2018-07-26 ENCOUNTER — Other Ambulatory Visit: Payer: Self-pay

## 2018-07-26 DIAGNOSIS — K5150 Left sided colitis without complications: Secondary | ICD-10-CM

## 2018-07-26 NOTE — Progress Notes (Signed)
Quant

## 2018-08-15 ENCOUNTER — Other Ambulatory Visit: Payer: POS

## 2018-08-15 DIAGNOSIS — K5150 Left sided colitis without complications: Secondary | ICD-10-CM

## 2018-08-17 ENCOUNTER — Encounter (INDEPENDENT_AMBULATORY_CARE_PROVIDER_SITE_OTHER): Payer: Self-pay

## 2018-08-17 ENCOUNTER — Encounter: Payer: Self-pay | Admitting: Internal Medicine

## 2018-08-17 ENCOUNTER — Ambulatory Visit: Payer: POS | Admitting: Internal Medicine

## 2018-08-17 ENCOUNTER — Other Ambulatory Visit (INDEPENDENT_AMBULATORY_CARE_PROVIDER_SITE_OTHER): Payer: POS

## 2018-08-17 VITALS — BP 120/86 | HR 80 | Ht 71.0 in | Wt 226.0 lb

## 2018-08-17 DIAGNOSIS — K5150 Left sided colitis without complications: Secondary | ICD-10-CM

## 2018-08-17 DIAGNOSIS — R0989 Other specified symptoms and signs involving the circulatory and respiratory systems: Secondary | ICD-10-CM

## 2018-08-17 DIAGNOSIS — Z7960 Long term (current) use of unspecified immunomodulators and immunosuppressants: Secondary | ICD-10-CM

## 2018-08-17 DIAGNOSIS — R05 Cough: Secondary | ICD-10-CM

## 2018-08-17 DIAGNOSIS — R053 Chronic cough: Secondary | ICD-10-CM

## 2018-08-17 DIAGNOSIS — R748 Abnormal levels of other serum enzymes: Secondary | ICD-10-CM | POA: Diagnosis not present

## 2018-08-17 DIAGNOSIS — Z79899 Other long term (current) drug therapy: Secondary | ICD-10-CM

## 2018-08-17 DIAGNOSIS — Z86010 Personal history of colonic polyps: Secondary | ICD-10-CM

## 2018-08-17 LAB — COMPREHENSIVE METABOLIC PANEL
ALT: 12 U/L (ref 0–35)
AST: 12 U/L (ref 0–37)
Albumin: 4.2 g/dL (ref 3.5–5.2)
Alkaline Phosphatase: 152 U/L — ABNORMAL HIGH (ref 39–117)
BUN: 15 mg/dL (ref 6–23)
CO2: 27 mEq/L (ref 19–32)
Calcium: 10.6 mg/dL — ABNORMAL HIGH (ref 8.4–10.5)
Chloride: 106 mEq/L (ref 96–112)
Creatinine, Ser: 0.74 mg/dL (ref 0.40–1.20)
GFR: 96.36 mL/min (ref >60.00)
Glucose, Bld: 89 mg/dL (ref 70–99)
Potassium: 3.8 mEq/L (ref 3.5–5.1)
SODIUM: 140 meq/L (ref 135–145)
Total Bilirubin: 0.4 mg/dL (ref 0.2–1.2)
Total Protein: 7.2 g/dL (ref 6.0–8.3)

## 2018-08-17 LAB — QUANTIFERON-TB GOLD PLUS
Mitogen-NIL: 7.97 IU/mL
NIL: 0.04 IU/mL
QuantiFERON-TB Gold Plus: NEGATIVE
TB1-NIL: 0.01 IU/mL
TB2-NIL: 0 IU/mL

## 2018-08-17 NOTE — Patient Instructions (Signed)
  Your provider has requested that you go to the basement level for lab work before leaving today. Press "B" on the elevator. The lab is located at the first door on the left as you exit the elevator.   Please see your PCP about your cough and asthma.   I appreciate the opportunity to care for you. Silvano Rusk, MD, [facility]

## 2018-08-17 NOTE — Progress Notes (Signed)
Katie Robinson 63 y.o. [birth year] [id number]  Assessment & Plan:   Left sided ulcerative (chronic) colitis ([place]) Doing well Reviewed rationale for continuing Tx w/ Humira RTC 6 mos routine sooner prn  Long-term use of immunosuppressant medication - Humira No toxicity or side effects noted  Hypercalcemia Rechecked labs, still mildly elevated and so is alk phos Will need further eval w/ PTH level I suspect Keep in mind hx breast cancer also -   Elevated alkaline phosphatase level Still elevated w/ ca - will need PTH level I think and ? Any relation to breast cancer See PCP  Chronic cough I wonder if due to asthma Seems like she could need daily inhaler, other meds See PCP  Globus sensation ? If globus vs throat irritation If persists despite eval/tx asthma could do ba swallow     Subjective:   Chief Complaint: ulcerative colitis f/u  HPI L[place]un says colitis under control - no diarrhea or abdominal pain. On Humira 40 mg every 2 weeks. Quantiferon this week negative.11/2017 colonoscopy - left colon pseudpolyps no active colitis  Still having a cough - x 1 year. Has been to Dr. Redmond Baseman - thought possibly LPR - she tried bid PPI but only slightly better. Also has an associated globus-like sendation - on left upper neck below jaw. No actual dysphagia  Does have asthma and is using prn albuterol but no maintenance Tx.   Also increased alk phos and calcium lately - had vit D dose increased and was to have f/u labs but has not.  Other sxs - some mild post-nasal gtt No dry eyes but some dry mouth    No Known Allergies Current Meds  Medication Sig  . Adalimumab (HUMIRA) 40 MG/0.8ML PSKT Inject 0.8 mLs (40 mg total) into the skin every 14 (fourteen) days.  Marland Kitchen albuterol (PROVENTIL HFA;VENTOLIN HFA) 108 (90 Base) MCG/ACT inhaler Inhale 2 puffs into the lungs every 6 (six) hours as needed for wheezing or shortness of breath.  Marland Kitchen amLODipine (NORVASC) 5 MG tablet Take 1  tablet (5 mg total) by mouth daily.  Marland Kitchen anastrozole (ARIMIDEX) 1 MG tablet Take 1 tablet (1 mg total) by mouth daily.  . Cholecalciferol (VITAMIN D3) 1000 units CAPS Take 2 capsules by mouth daily.    Current Facility-Administered Medications for the 08/17/18 encounter (Office Visit) with Gatha Mayer, MD  Medication  . 0.9 %  sodium chloride infusion   Past Medical History:  Diagnosis Date  . Allergy    SEASONAL  . Anemia   . Arthralgia   . Asthma    controlled  . Breast cancer, left breast ([place]) 05/2017   left breast cancer  . Chronic headache    many years  . Family history of premature coronary artery disease    father  . Former smoker    10 pack year history  . GERD (gastroesophageal reflux disease)    mild, occasional  . H/O bone density study 11/2015   normal  . H/O cardiovascular stress test 2009  . History of radiation therapy 08/12/17- 09/08/17   Left Breast 40.05 Gy in 15 fractions, Left Breast Boost/ 10 Gy in 5 fractions.   Marland Kitchen Hx of adenomatous and sessile serrated colonic polyps 05/24/2014  . Iron deficiency 11/29/2015  . Obesity   . Pectus excavatum    mild, left asymmetric  . Personal history of radiation therapy   . S/P hysterectomy    due to fibroids  . Seasonal allergic rhinitis   .  UC (ulcerative colitis) ([place]) 2015   Dr. Carlean Purl  . Urinary tract infection 2013  . UTI due to extended-spectrum beta lactamase (ESBL) producing Escherichia coli 09/20/2017  . Vitamin D deficiency 07/18/2014  . Wears glasses    Past Surgical History:  Procedure Laterality Date  . BREAST LUMPECTOMY Left 06/2017  . BREAST LUMPECTOMY WITH RADIOACTIVE SEED AND SENTINEL LYMPH NODE BIOPSY Left 06/29/2017   Procedure: LEFT BREAST LUMPECTOMY WITH RADIOACTIVE SEED AND LEFT AXILLARY SENTINEL LYMPH NODE BIOPSY ERAS PATHWAY;  Surgeon: Rolm Bookbinder, MD;  Location: [facility];  Service: General;  Laterality: Left;  . COLONOSCOPY  02/2016   polyps; Dr. Silvano Rusk  .  EVACUATION BREAST HEMATOMA Left 07/11/2017   Procedure: EVACUATION HEMATOMA BREAST;  Surgeon: Leighton Ruff, MD;  Location: [facility];  Service: General;  Laterality: Left;  . FOOT TENDON SURGERY Left 2013  . NAVEL REMOVED     AS A CHILD  . OTHER SURGICAL HISTORY     biopsy for possible sarcoidosis  . POLYPECTOMY    . SKIN BIOPSY    . TONSILLECTOMY    . TOTAL ABDOMINAL HYSTERECTOMY  2002   total; Dr. Molli Posey  . WISDOM TOOTH EXTRACTION     Social History   Social History Narrative   Lives with her son, has 2 adult children, not married, no exercise, active with walking on the job at the [place] [employer].   04/2018   family history includes Alzheimer's disease in her mother; Aneurysm in her mother; Asthma in her father and sister; Heart disease in her mother; Heart disease (age of onset: 16) in her father; Hypertension in her mother and sister.   Review of Systems As per HPI  Objective:   Physical Exam _0  120/86   Pulse 80   Ht _1  (1.803 m)   Wt 226 lb (102.5 kg)   BMI 31.52 kg/m @  General:  NAD Eyes:   anicteric Lungs:  Mild wheezing, diffuse, good air mvt Heart::  S1S2 no rubs, murmurs or gallops Abdomen:  soft and nontender, BS+ Ext:   no edema, cyanosis or clubbing    Data Reviewed:

## 2018-08-17 NOTE — Progress Notes (Signed)
I pended a parathyroid hormone order Can you make sure lab can add that to her labs from today and let me know?

## 2018-08-18 DIAGNOSIS — R0989 Other specified symptoms and signs involving the circulatory and respiratory systems: Secondary | ICD-10-CM | POA: Insufficient documentation

## 2018-08-18 DIAGNOSIS — R748 Abnormal levels of other serum enzymes: Secondary | ICD-10-CM | POA: Insufficient documentation

## 2018-08-18 DIAGNOSIS — R053 Chronic cough: Secondary | ICD-10-CM | POA: Insufficient documentation

## 2018-08-18 DIAGNOSIS — R05 Cough: Secondary | ICD-10-CM | POA: Insufficient documentation

## 2018-08-18 NOTE — Assessment & Plan Note (Signed)
?   If globus vs throat irritation If persists despite eval/tx asthma could do ba swallow

## 2018-08-18 NOTE — Assessment & Plan Note (Signed)
Doing well Reviewed rationale for continuing Tx w/ Humira RTC 6 mos routine sooner prn

## 2018-08-18 NOTE — Assessment & Plan Note (Signed)
No toxicity or side effects noted

## 2018-08-18 NOTE — Assessment & Plan Note (Addendum)
Still elevated w/ ca - will need PTH level I think and ? Any relation to breast cancer See PCP

## 2018-08-18 NOTE — Progress Notes (Signed)
Calcium and alkaline phosphatase still up  She needs to see PCP about this and cough/asthma  I am going to message him but she should get an appointment

## 2018-08-18 NOTE — Assessment & Plan Note (Signed)
Rechecked labs, still mildly elevated and so is alk phos Will need further eval w/ PTH level I suspect Keep in mind hx breast cancer also -

## 2018-08-18 NOTE — Assessment & Plan Note (Addendum)
I wonder if due to asthma Seems like she could need daily inhaler, other meds See PCP

## 2018-08-19 ENCOUNTER — Telehealth: Payer: Self-pay

## 2018-08-19 NOTE — Telephone Encounter (Signed)
Patient informed of lab results and she is aware that she needs to see her PCP.

## 2018-08-22 ENCOUNTER — Telehealth: Payer: Self-pay | Admitting: Medical

## 2018-08-22 ENCOUNTER — Ambulatory Visit: Payer: 59 | Admitting: Medical

## 2018-08-22 ENCOUNTER — Encounter: Payer: Self-pay | Admitting: Medical

## 2018-08-22 VITALS — BP 126/70 | HR 74 | Temp 98.3°F | Resp 16 | Ht 72.0 in | Wt 228.6 lb

## 2018-08-22 DIAGNOSIS — J4540 Moderate persistent asthma, uncomplicated: Secondary | ICD-10-CM | POA: Diagnosis not present

## 2018-08-22 DIAGNOSIS — R748 Abnormal levels of other serum enzymes: Secondary | ICD-10-CM

## 2018-08-22 DIAGNOSIS — R0989 Other specified symptoms and signs involving the circulatory and respiratory systems: Secondary | ICD-10-CM

## 2018-08-22 DIAGNOSIS — R0982 Postnasal drip: Secondary | ICD-10-CM | POA: Diagnosis not present

## 2018-08-22 DIAGNOSIS — K5150 Left sided colitis without complications: Secondary | ICD-10-CM

## 2018-08-22 DIAGNOSIS — R6889 Other general symptoms and signs: Secondary | ICD-10-CM

## 2018-08-22 DIAGNOSIS — E559 Vitamin D deficiency, unspecified: Secondary | ICD-10-CM

## 2018-08-22 DIAGNOSIS — Z853 Personal history of malignant neoplasm of breast: Secondary | ICD-10-CM

## 2018-08-22 MED ORDER — LORATADINE 10 MG PO TABS: 10.0000 mg | ORAL_TABLET | Freq: Every day | ORAL | 2 refills | Status: DC

## 2018-08-22 MED ORDER — BECLOMETHASONE DIPROP HFA 40 MCG/ACT IN AERB: 1.0000 | INHALATION_SPRAY | Freq: Two times a day (BID) | RESPIRATORY_TRACT | 2 refills | Status: DC

## 2018-08-22 NOTE — Telephone Encounter (Signed)
I would like to start her on Claritin oral tablet and Qvar prevention inhaler for her symptoms and recheck in 2 weeks.    With her medication Humira, if any fever STOP Qvar and call GI or [place] right away in the even of infection.

## 2018-08-22 NOTE — Telephone Encounter (Signed)
Patient notified

## 2018-08-22 NOTE — Progress Notes (Signed)
Subjective:     Patient ID: Katie Robinson, female   DOB: 11/19/1955, 63 y.o.   MRN: [id number]  HPI Chief Complaint  Patient presents with  . consult    discuss asthma, calcium per Dr Roxine Caddy  tickle in her throat    Here for several concerns.  Saw GI, Dr. Carlean Purl recently.  He advised she return here for other eval.     Elevated ALP and calcium -she discussed with Dr. Carlean Purl, and Dr. Carlean Purl sent message to me to review with her.    Asthma - she notes Dr. Carlean Purl could hear her wheezing at recent visit.  Uses inhaler at bedtime to help during sleep.  Has tickle in throat for over a year.  Thought it was GERD related prior.   Had been on PPI and that didn't help.  Has seen ENT for this last summer, and they increased the PPI to BID.  Still no improvement.  Not currently taking an allergy pill.   Stopped the PPI as it wasn't helping.  Dr. Carlean Purl felt like treating the asthma with other medications could help.   Nonsmoker.  No animals in the house.  Has some coughing and throat clearing.  Doesn't feel SOB that often, sometimes hears wheezes though.  No belly discomfort.   No current problems with bowels or bladder.  No other aggravating or relieving factors. No other complaint.  Past Medical History:  Diagnosis Date  . Allergy    SEASONAL  . Anemia   . Arthralgia   . Asthma    controlled  . Breast cancer, left breast ([place]) 05/2017   left breast cancer  . Chronic headache    many years  . Family history of premature coronary artery disease    father  . Former smoker    10 pack year history  . GERD (gastroesophageal reflux disease)    mild, occasional  . H/O bone density study 11/2015   normal  . H/O cardiovascular stress test 2009  . History of radiation therapy 08/12/17- 09/08/17   Left Breast 40.05 Gy in 15 fractions, Left Breast Boost/ 10 Gy in 5 fractions.   Marland Kitchen Hx of adenomatous and sessile serrated colonic polyps 05/24/2014  . Iron deficiency 11/29/2015  . Obesity    . Pectus excavatum    mild, left asymmetric  . Personal history of radiation therapy   . S/P hysterectomy    due to fibroids  . Seasonal allergic rhinitis   . UC (ulcerative colitis) ([place]) 2015   Dr. Carlean Purl  . Urinary tract infection 2013  . UTI due to extended-spectrum beta lactamase (ESBL) producing Escherichia coli 09/20/2017  . Vitamin D deficiency 07/18/2014  . Wears glasses    Current Outpatient Medications on File Prior to Visit  Medication Sig Dispense Refill  . Adalimumab (HUMIRA) 40 MG/0.8ML PSKT Inject 0.8 mLs (40 mg total) into the skin every 14 (fourteen) days. 2 each 4  . albuterol (PROVENTIL HFA;VENTOLIN HFA) 108 (90 Base) MCG/ACT inhaler Inhale 2 puffs into the lungs every 6 (six) hours as needed for wheezing or shortness of breath. 18 g 1  . amLODipine (NORVASC) 5 MG tablet Take 1 tablet (5 mg total) by mouth daily. 90 tablet 1  . anastrozole (ARIMIDEX) 1 MG tablet Take 1 tablet (1 mg total) by mouth daily. 90 tablet 4  . Cholecalciferol (VITAMIN D3) 1000 units CAPS Take 2 capsules by mouth daily.      Current Facility-Administered Medications on File Prior to Visit  Medication Dose Route Frequency Provider Last Rate Last Dose  . 0.9 %  sodium chloride infusion  500 mL Intravenous Once Gatha Mayer, MD        Review of Systems As in subjective    Objective:   Physical Exam BP 126/70   Pulse 74   Temp 98.3 F (36.8 C) (Oral)   Resp 16   Ht 6' (1.829 m)   Wt 228 lb 9.6 oz (103.7 kg)   SpO2 98%   BMI 31.00 kg/m   Wt Readings from Last 3 Encounters:  08/22/18 228 lb 9.6 oz (103.7 kg)  08/17/18 226 lb (102.5 kg)  06/24/18 232 lb 6.4 oz (105.4 kg)   General appearance: alert, no distress, WD/WN,  HEENT: normocephalic, sclerae anicteric, TMs pearly, nares patent, no discharge or erythema, pharynx normal Oral cavity: MMM, no lesions Neck: supple, no lymphadenopathy, no thyromegaly, no masses Heart: RRR, normal S1, S2, no murmurs Lungs: few lower field  faint wheezes, otherwise no rhonchi, or rales Abdomen: +bs, soft, non tender, non distended, no masses, no hepatomegaly, no splenomegaly Pulses: 2+ symmetric, upper and lower extremities, normal cap refill Ext: no edema      Assessment:     Encounter Diagnoses  Name Primary?  . Throat clearing Yes  . Post-nasal drainage   . Moderate persistent asthma without complication   . Alkaline phosphatase elevation   . Vitamin D deficiency   . Left sided ulcerative (chronic) colitis ([place])   . Hypercalcemia   . History of breast cancer        Plan:     ALP elevated - reviewed most recent fractionated ALP labs from 05/18/2018.  Discussed possible differential.   additional labs as below  Throat clearing, asthma, post nasal drainage - reviewed PFT, mild airway obstruction.   Begin Claritin oral, qvar inhaler.  F/u 2 weeks  Vit D deficiency - labs today, c/t supplement, counseled on dietary measures  Ulcerative colitis - reviewed most recent GI notes from Dr. Carlean Purl  Hx/o breast cancer - stable, in remission, reviewed 06/13/2018 notes from Dr. Netta Neat was seen today for consult.  Diagnoses and all orders for this visit:  Throat clearing  Post-nasal drainage  Moderate persistent asthma without complication -     Spirometry with graph -     DG Chest 2 View; Future  Alkaline phosphatase elevation -     PTH, Intact and Calcium -     VITAMIN D 25 Hydroxy (Vit-D Deficiency, Fractures)  Vitamin D deficiency -     VITAMIN D 25 Hydroxy (Vit-D Deficiency, Fractures)  Left sided ulcerative (chronic) colitis ([facility])  Hypercalcemia -     DG Chest 2 View; Future  History of breast cancer  Other orders -     beclomethasone (QVAR REDIHALER) 40 MCG/ACT inhaler; Inhale 1 puff into the lungs 2 (two) times daily. -     loratadine (CLARITIN) 10 MG tablet; Take 1 tablet (10 mg total) by mouth daily.

## 2018-08-22 NOTE — Telephone Encounter (Signed)
Left message for patient to return my call to let her know that Audelia Acton did call in Qvar and claritin and that he did want her to start it.

## 2018-08-23 ENCOUNTER — Other Ambulatory Visit: Payer: Self-pay

## 2018-08-23 ENCOUNTER — Other Ambulatory Visit: Payer: Self-pay | Admitting: Medical

## 2018-08-23 ENCOUNTER — Telehealth: Payer: Self-pay

## 2018-08-23 LAB — PTH, INTACT AND CALCIUM
Calcium: 10.3 mg/dL (ref 8.7–10.3)
PTH: 55 pg/mL (ref 15–65)

## 2018-08-23 LAB — VITAMIN D 25 HYDROXY (VIT D DEFICIENCY, FRACTURES): Vit D, 25-Hydroxy: 35 ng/mL (ref 30.0–100.0)

## 2018-08-23 NOTE — Telephone Encounter (Signed)
See lab note question.

## 2018-08-24 NOTE — Telephone Encounter (Signed)
I looked back at the lab question about Symbicort vs Qvar?    Is she taking Symbicort currently?     It was not listed in the triage med list at her recent visit as active?    If she is using Symbicort, what is her current dosing, 1 puff daily, 1 puff BID, 2 puffs BID?  If using Symbicort, has she been using it EVERY day for the recent last few weeks?  So to answer her original questions, no she should NOT be taking Qvar and Symbicort simultaneously.

## 2018-08-25 NOTE — Telephone Encounter (Signed)
Spoke with patient and she was advised after I spoke with Audelia Acton to please use Qvar BID and rinse well after use. She is to call in 1-2 weeks to let him know whether or not this is helping with the throat clearing and mucus. Patient verbalized understanding.

## 2018-09-12 ENCOUNTER — Other Ambulatory Visit: Payer: Self-pay

## 2018-09-12 ENCOUNTER — Other Ambulatory Visit (INDEPENDENT_AMBULATORY_CARE_PROVIDER_SITE_OTHER): Payer: 59

## 2018-09-12 DIAGNOSIS — Z23 Encounter for immunization: Secondary | ICD-10-CM | POA: Diagnosis not present

## 2018-09-15 ENCOUNTER — Other Ambulatory Visit: Payer: Self-pay

## 2018-09-15 ENCOUNTER — Ambulatory Visit: Payer: 59 | Admitting: Family Medicine

## 2018-09-15 ENCOUNTER — Encounter: Payer: Self-pay | Admitting: Family Medicine

## 2018-09-15 VITALS — Ht 72.0 in | Wt 225.0 lb

## 2018-09-15 DIAGNOSIS — J029 Acute pharyngitis, unspecified: Secondary | ICD-10-CM | POA: Diagnosis not present

## 2018-09-15 NOTE — Progress Notes (Signed)
   Subjective:    Patient ID: Katie Robinson, female    DOB: Apr 23, 1956, 63 y.o.   MRN: [id number]  Interactive audio and video telecommunications were attempted between this provider and patient, however due to patient unable to access video using Zoom,  we continued and completed visit with audio only.  The provider was located in the office. The patient did consent to this visit and is aware of possible charges through their insurance for this visit.  The other persons participating in this telemedicine service were none.  This virtual service is not related to other E/M service within previous 7 days.  HPI Chief Complaint  Patient presents with  . Sore Throat    main issue is ST today. Did have Shingrix #2 on this past Monday-no effects from #1. One day she did feel like she had a little temp, body aches and chills Tues evening.    She is a 63 year old female with a history of asthma, allergies and HTN, UC, and breast cancer who complains of a 2-day history of throat irritation.  Denies pain or difficulty swallowing.  Reports having a chronic cough that often irritates her throat.  She has seen her PCP and ENT for the cough.  States her cough is unchanged.  Denies chest pain, palpitations or shortness of breath.  Currently taking Qvar for asthma, this is a fairly new medication for her.  She does report that she is rinsing her mouth out after using it.  Denies any mouth sores.  States she had a Shingrix vaccine 3 days ago. Reports feeling feverish along with chills and body aches the next day but the symptoms went away.  Works at night at the [employer].  States she stayed home last night because she was not feeling well and states she plans to take the next 2 nights off as well. Denies being around anyone with known COVID-19.   Review of Systems Pertinent positives and negatives in the history of present illness.     Objective:   Physical Exam Ht 6' (1.829 m)   Wt 225 lb  (102.1 kg)   BMI 30.52 kg/m  Alert and oriented and in no acute distress. Speaking in complete sentences without difficulty. Clear speech, mood and thought process.       Assessment & Plan:  Acute pharyngitis, unspecified etiology  Information obtained via telephone after she was unable to access Zoom video.  She agreed to continuing the visit this way and is aware that her insurance will be billed. No acute distress.  Discussed that she does not appear to have any obvious infectious process. The subjective fever, chills and body aches that lasted for only a few hours or most likely due to the Shingrix vaccine and these have resolved completely. She now only has throat "irritation".  She may use salt water gargles, Tylenol and throat lozenges.  Strict precautions to report back if he has any new or worsening symptoms. Discussed that she is low risk for having COVID-19 since she did ask if she should be tested. I also advised that she could return to work tomorrow night if she is not worsening.

## 2018-09-26 ENCOUNTER — Other Ambulatory Visit: Payer: Self-pay | Admitting: Medical

## 2018-09-26 NOTE — Telephone Encounter (Signed)
Is this okay to refill? 

## 2018-10-27 ENCOUNTER — Other Ambulatory Visit: Payer: Self-pay | Admitting: Oncology

## 2018-10-28 ENCOUNTER — Telehealth: Payer: Self-pay | Admitting: Medical

## 2018-10-28 MED ORDER — LORATADINE 10 MG PO TABS: 10.0000 mg | ORAL_TABLET | Freq: Every day | ORAL | 0 refills | Status: DC

## 2018-10-28 NOTE — Telephone Encounter (Signed)
Pt left message needs refill Loratadine to [facility]

## 2018-10-28 NOTE — Telephone Encounter (Signed)
This was sent in to local pharmacy in march for 90 days. I have resent to [facility] for 90 days

## 2018-11-22 ENCOUNTER — Other Ambulatory Visit: Payer: Self-pay | Admitting: Internal Medicine

## 2018-12-08 ENCOUNTER — Ambulatory Visit: Payer: POS | Admitting: Oncology

## 2019-01-07 ENCOUNTER — Other Ambulatory Visit: Payer: Self-pay | Admitting: Medical

## 2019-01-07 DIAGNOSIS — I10 Essential (primary) hypertension: Secondary | ICD-10-CM

## 2019-03-07 ENCOUNTER — Other Ambulatory Visit: Payer: Self-pay | Admitting: Medical

## 2019-04-09 ENCOUNTER — Other Ambulatory Visit: Payer: Self-pay | Admitting: Medical

## 2019-04-09 DIAGNOSIS — I10 Essential (primary) hypertension: Secondary | ICD-10-CM

## 2019-05-02 ENCOUNTER — Telehealth: Payer: Self-pay | Admitting: Medical

## 2019-05-02 DIAGNOSIS — Z853 Personal history of malignant neoplasm of breast: Secondary | ICD-10-CM

## 2019-05-02 NOTE — Telephone Encounter (Signed)
Pt needs order diagnostic mammogram to the [facility] center

## 2019-05-03 NOTE — Telephone Encounter (Signed)
Order has been placed.

## 2019-06-12 ENCOUNTER — Ambulatory Visit
Admission: RE | Admit: 2019-06-12 | Discharge: 2019-06-12 | Disposition: A | Discharge status: Home / Self Care | Payer: POS | Source: Ambulatory Visit | Attending: Medical | Admitting: Medical

## 2019-06-12 ENCOUNTER — Other Ambulatory Visit: Payer: Self-pay

## 2019-06-12 DIAGNOSIS — Z853 Personal history of malignant neoplasm of breast: Secondary | ICD-10-CM

## 2019-06-27 ENCOUNTER — Telehealth: Payer: Self-pay | Admitting: Medical

## 2019-06-27 DIAGNOSIS — I10 Essential (primary) hypertension: Secondary | ICD-10-CM

## 2019-06-27 MED ORDER — AMLODIPINE BESYLATE 5 MG PO TABS: 5.0000 mg | ORAL_TABLET | Freq: Every day | ORAL | 0 refills | Status: DC

## 2019-06-27 NOTE — Telephone Encounter (Signed)
Medication has been sent to pharmacy.  °

## 2019-06-27 NOTE — Telephone Encounter (Signed)
Pt called and has a medcheck Monday jan the 18th but will run out of her amlodipine she would like a refill before her appt please send to [id number] [place] [place]

## 2019-06-29 IMAGING — MG DIGITAL DIAGNOSTIC BILATERAL MAMMOGRAM WITH TOMO AND CAD
6 of 10 series · 6 of 26 positions shown · non-contrast
Comparison: Previous exam(s).

CLINICAL DATA: History of left breast cancer status post lumpectomy
in Thursday June, 2017.

EXAM:
DIGITAL DIAGNOSTIC BILATERAL MAMMOGRAM WITH CAD AND TOMO

[L CC (1 of 2)]
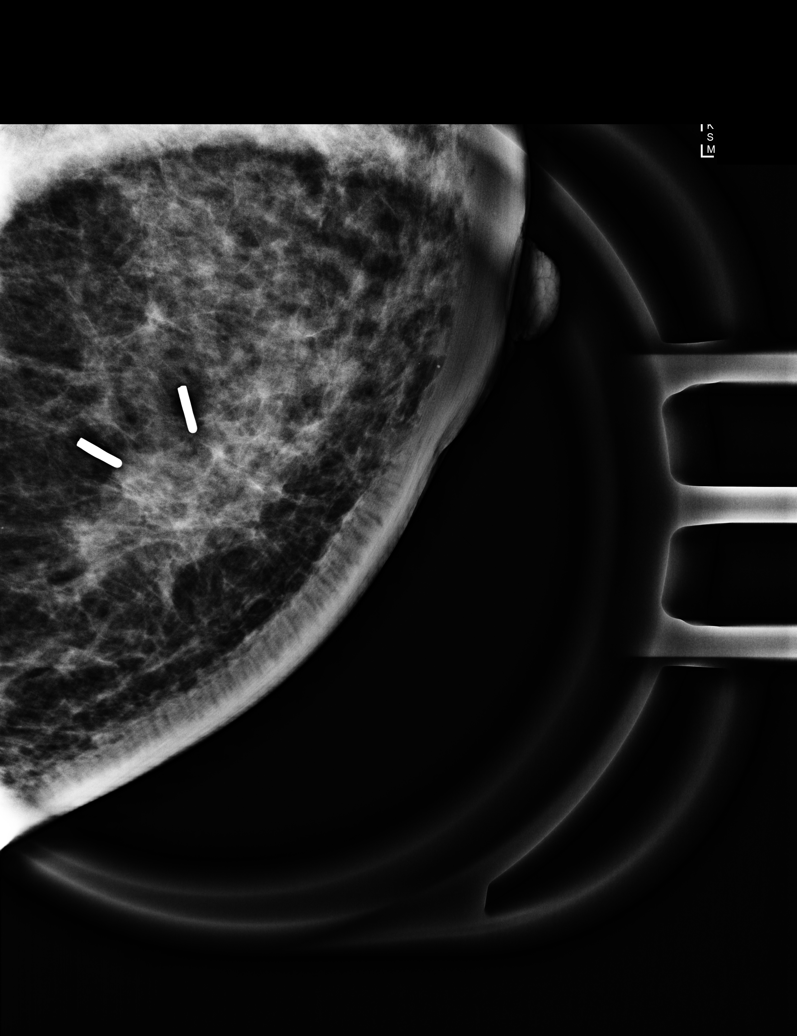

[L CC (2 of 2)]
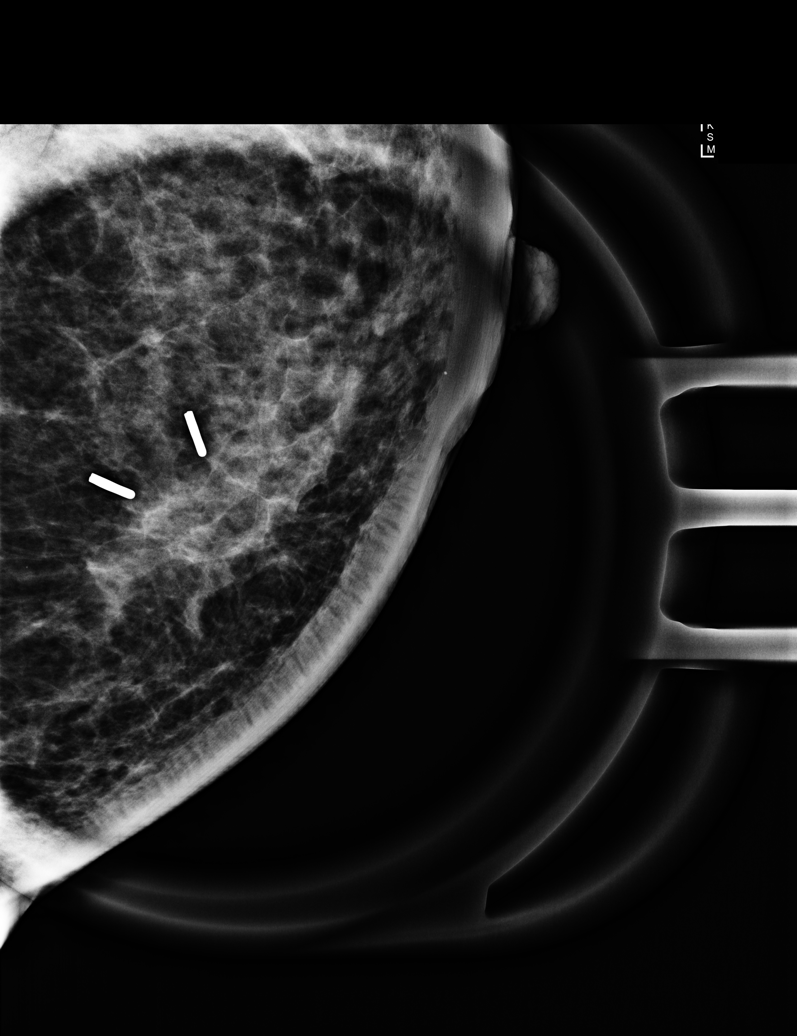

[L MLO synth-2D]
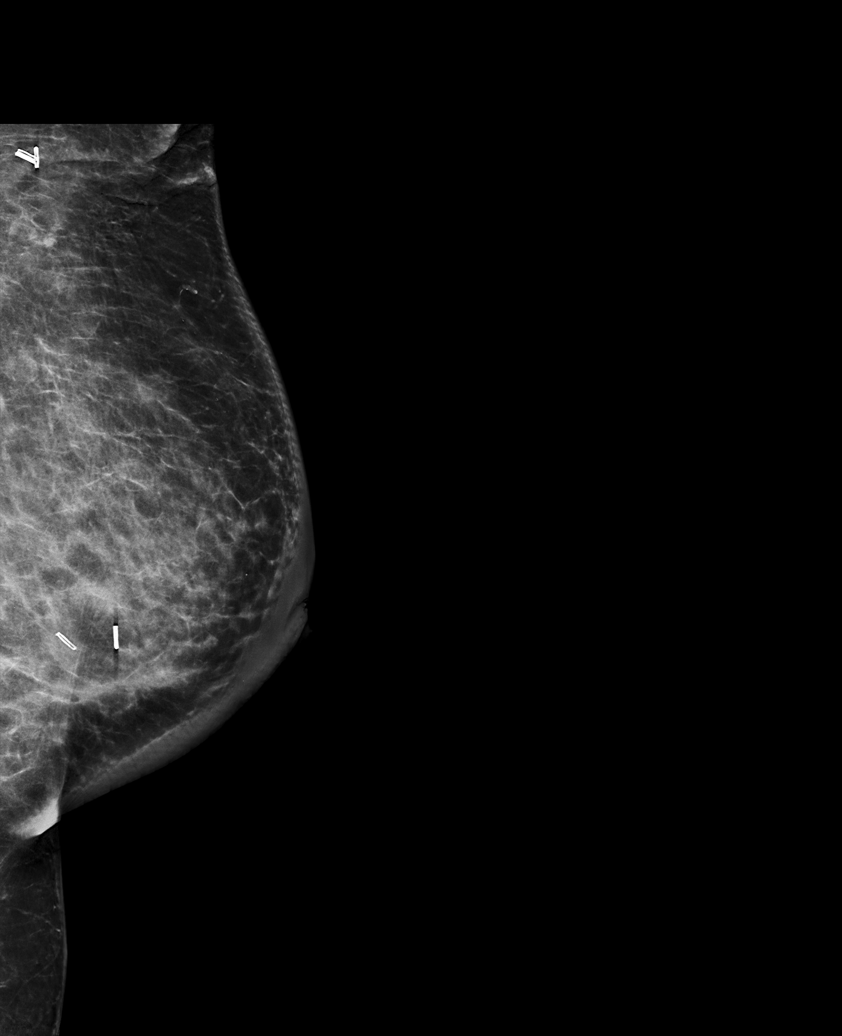

[L CC synth-2D]
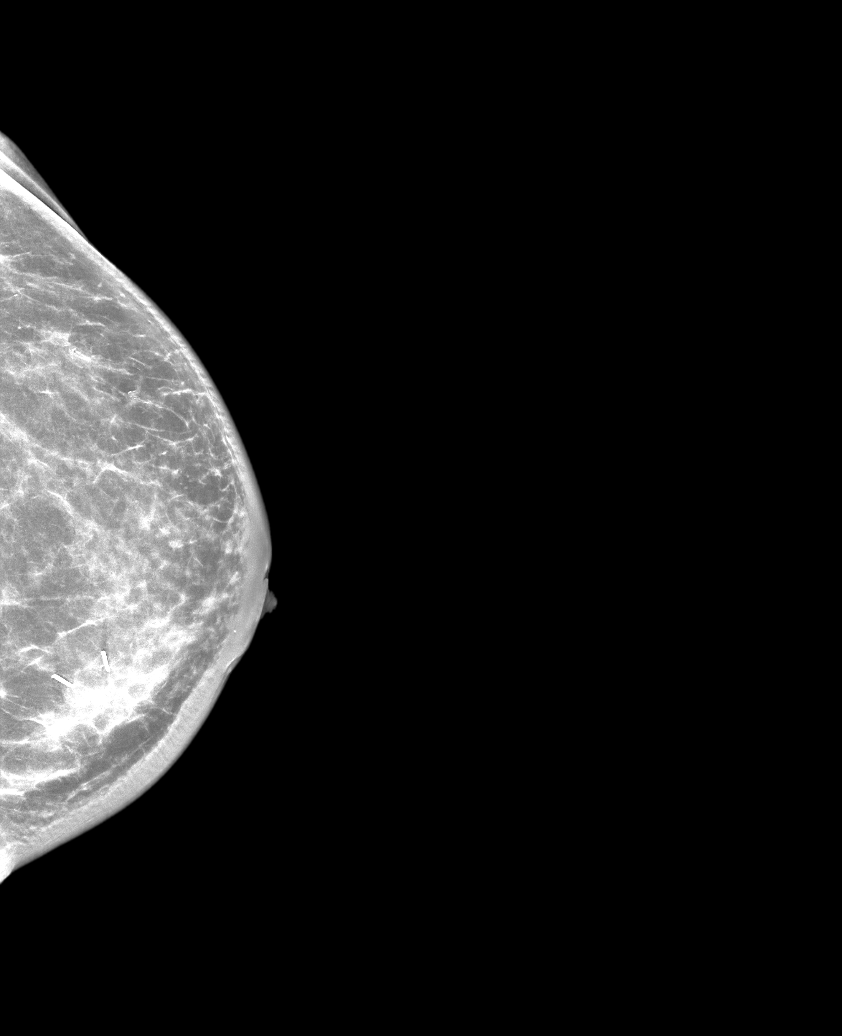

[R CC synth-2D]
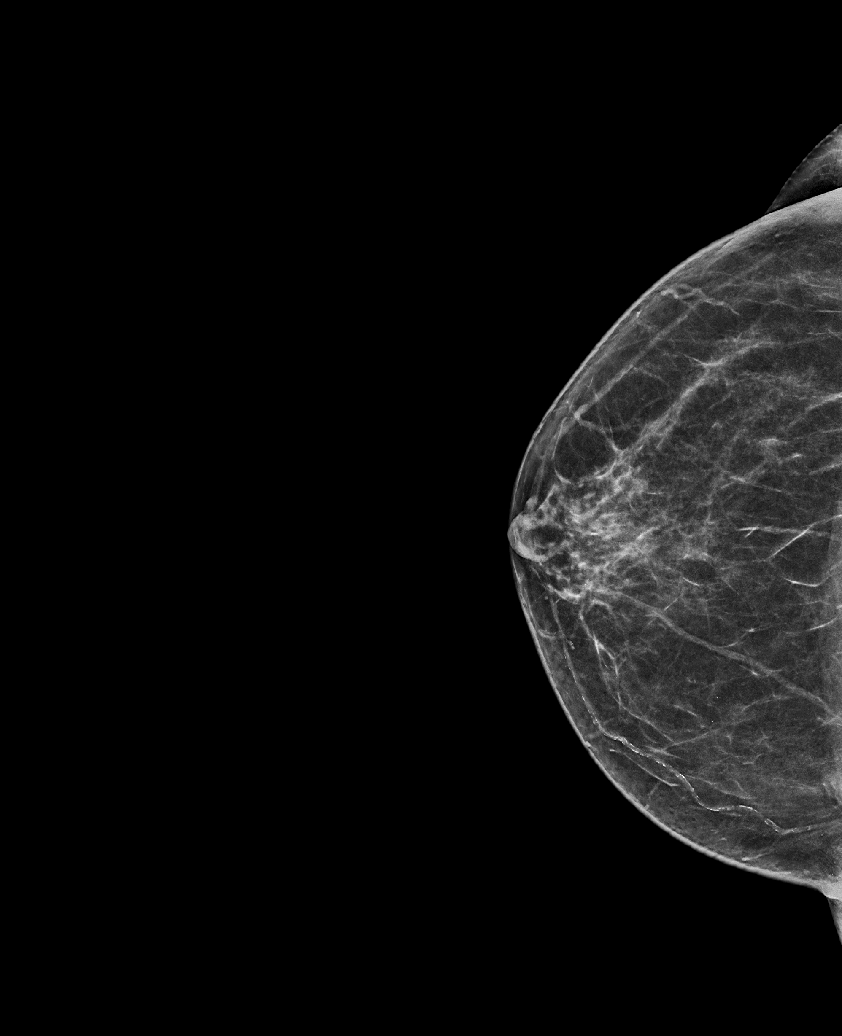

[R MLO synth-2D]
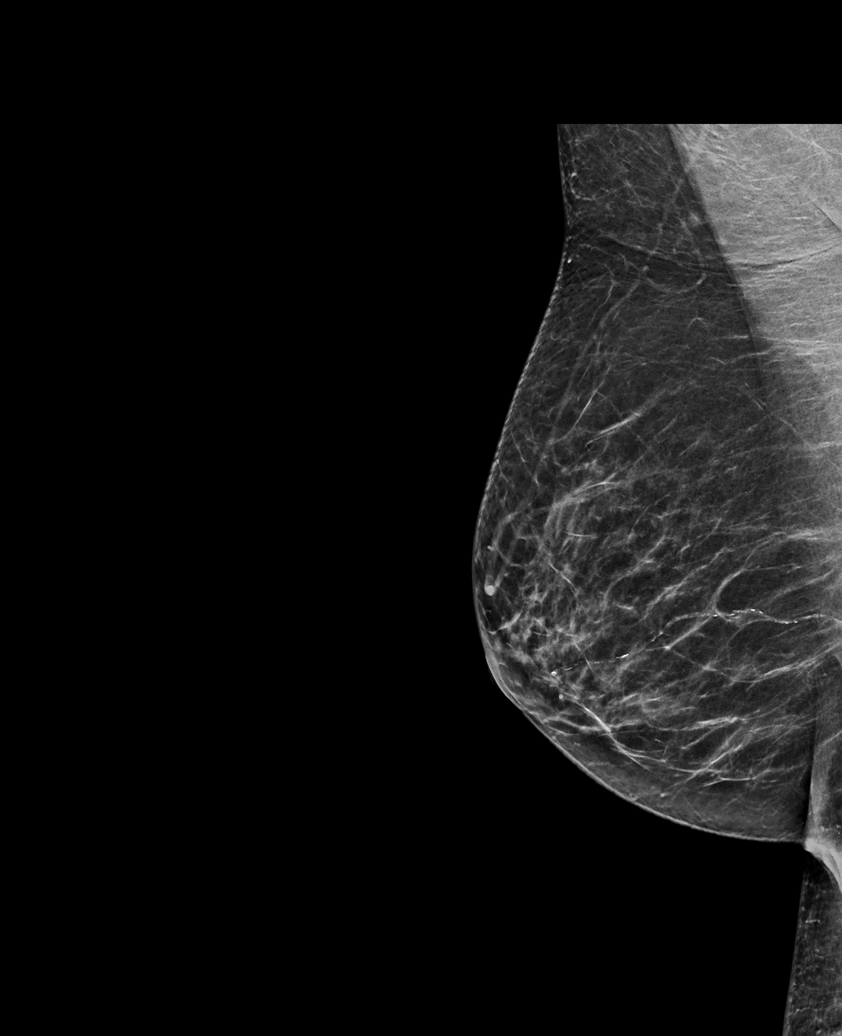

[6 of 26 positions shown; findings below may reference images not displayed]

ACR Breast Density Category b: There are scattered areas of
fibroglandular density.
FINDINGS: Lumpectomy changes are seen in the left breast. No suspicious mass
or malignant type microcalcifications identified in either breast.

Mammographic images were processed with CAD.
IMPRESSION: No evidence of malignancy in either breast.

RECOMMENDATION:
Bilateral diagnostic mammogram in 1 year is recommended.

I have discussed the findings and recommendations with the patient.
Results were also provided in writing at the conclusion of the
visit. If applicable, a reminder letter will be sent to the patient
regarding the next appointment.

BI-RADS CATEGORY  2: Benign.

## 2019-07-03 ENCOUNTER — Ambulatory Visit: Payer: POS | Admitting: Medical

## 2019-07-03 ENCOUNTER — Other Ambulatory Visit: Payer: Self-pay | Admitting: Medical

## 2019-07-03 ENCOUNTER — Other Ambulatory Visit: Payer: Self-pay

## 2019-07-03 ENCOUNTER — Encounter: Payer: Self-pay | Admitting: Medical

## 2019-07-03 ENCOUNTER — Encounter: Payer: Self-pay | Admitting: Internal Medicine

## 2019-07-03 ENCOUNTER — Telehealth: Payer: Self-pay | Admitting: Medical

## 2019-07-03 VITALS — Ht 72.0 in | Wt 234.0 lb

## 2019-07-03 DIAGNOSIS — Z1159 Encounter for screening for other viral diseases: Secondary | ICD-10-CM

## 2019-07-03 DIAGNOSIS — Z7960 Long term (current) use of unspecified immunomodulators and immunosuppressants: Secondary | ICD-10-CM

## 2019-07-03 DIAGNOSIS — R748 Abnormal levels of other serum enzymes: Secondary | ICD-10-CM

## 2019-07-03 DIAGNOSIS — J301 Allergic rhinitis due to pollen: Secondary | ICD-10-CM | POA: Diagnosis not present

## 2019-07-03 DIAGNOSIS — K5150 Left sided colitis without complications: Secondary | ICD-10-CM

## 2019-07-03 DIAGNOSIS — J4530 Mild persistent asthma, uncomplicated: Secondary | ICD-10-CM | POA: Diagnosis not present

## 2019-07-03 DIAGNOSIS — J4540 Moderate persistent asthma, uncomplicated: Secondary | ICD-10-CM | POA: Diagnosis not present

## 2019-07-03 DIAGNOSIS — I10 Essential (primary) hypertension: Secondary | ICD-10-CM | POA: Diagnosis not present

## 2019-07-03 DIAGNOSIS — E559 Vitamin D deficiency, unspecified: Secondary | ICD-10-CM

## 2019-07-03 DIAGNOSIS — Z0000 Encounter for general adult medical examination without abnormal findings: Secondary | ICD-10-CM

## 2019-07-03 DIAGNOSIS — Z79899 Other long term (current) drug therapy: Secondary | ICD-10-CM

## 2019-07-03 DIAGNOSIS — Z7185 Encounter for immunization safety counseling: Secondary | ICD-10-CM

## 2019-07-03 DIAGNOSIS — Z7189 Other specified counseling: Secondary | ICD-10-CM

## 2019-07-03 DIAGNOSIS — Z853 Personal history of malignant neoplasm of breast: Secondary | ICD-10-CM

## 2019-07-03 DIAGNOSIS — Z86010 Personal history of colonic polyps: Secondary | ICD-10-CM

## 2019-07-03 MED ORDER — QVAR REDIHALER 40 MCG/ACT IN AERB: 1.0000 | INHALATION_SPRAY | Freq: Two times a day (BID) | RESPIRATORY_TRACT | 5 refills | Status: DC

## 2019-07-03 MED ORDER — AMLODIPINE BESYLATE 5 MG PO TABS: 5.0000 mg | ORAL_TABLET | Freq: Every day | ORAL | 1 refills | Status: DC

## 2019-07-03 MED ORDER — VITAMIN D3 25 MCG (1000 UT) PO CAPS: 2.0000 | ORAL_CAPSULE | Freq: Every day | ORAL | 1 refills | Status: DC

## 2019-07-03 MED ORDER — ALBUTEROL SULFATE HFA 108 (90 BASE) MCG/ACT IN AERS: INHALATION_SPRAY | RESPIRATORY_TRACT | 1 refills | Status: DC

## 2019-07-03 NOTE — Progress Notes (Signed)
This visit type was conducted due to national recommendations for restrictions regarding the COVID-19 Pandemic (e.g. social distancing) in an effort to limit this patient's exposure and mitigate transmission in our community.  Due to their co-morbid illnesses, this patient is at least at moderate risk for complications without adequate follow up.  This format is felt to be most appropriate for this patient at this time.    Documentation for virtual audio and video telecommunications through Zoom encounter:  The patient was located at home. The provider was located in the office. The patient did consent to this visit and is aware of possible charges through their insurance for this visit.  The other persons participating in this telemedicine service were none. Time spent on call was 20 minutes and in review of previous records >20 minutes total.  This virtual service is not related to other E/M service within previous 7 days.   Subjective: Chief Complaint  Patient presents with  . Medication Management   Doing well.  Still working full-time at the post office.  Not exercising some of late because it has gotten colder.  No recent problems with chest pain difficulty breathing swelling or other issues.  She is not checking her blood pressure.  She did go to see her eye doctor recently and her blood pressure was normal at 124/79 .  She had a flu shot at work recently.  She is compliant with her medications  She has not seen her gastro doctor since March.  No recent flareups of ulcer colitis  She has asthma and is using her inhaler probably 3 days/week.  She is not currently using the Qvar as she is supposed to  Past Medical History:  Diagnosis Date  . Allergy    SEASONAL  . Anemia   . Arthralgia   . Asthma    controlled  . Breast cancer, left breast ([place]) 05/2017   left breast cancer  . Chronic headache    many years  . Family history of premature coronary artery disease    father   . Former smoker    10 pack year history  . GERD (gastroesophageal reflux disease)    mild, occasional  . H/O bone density study 11/2015   normal  . H/O cardiovascular stress test 2009  . History of radiation therapy 08/12/17- 09/08/17   Left Breast 40.05 Gy in 15 fractions, Left Breast Boost/ 10 Gy in 5 fractions.   Marland Kitchen Hx of adenomatous and sessile serrated colonic polyps 05/24/2014  . Iron deficiency 11/29/2015  . Obesity   . Pectus excavatum    mild, left asymmetric  . Personal history of radiation therapy   . S/P hysterectomy    due to fibroids  . Seasonal allergic rhinitis   . UC (ulcerative colitis) ([facility]) 2015   Katie Robinson  . Urinary tract infection 2013  . UTI due to extended-spectrum beta lactamase (ESBL) producing Escherichia coli 09/20/2017  . Vitamin D deficiency 07/18/2014  . Wears glasses    Current Outpatient Medications on File Prior to Visit  Medication Sig Dispense Refill  . anastrozole (ARIMIDEX) 1 MG tablet TAKE 1 TABLET DAILY 90 tablet 3  . HUMIRA 40 MG/0.8ML PSKT INJECT 40 MG (0.8 ML) UNDER THE SKIN EVERY 14 DAYS 2 each 12  . loratadine (CLARITIN) 10 MG tablet TAKE 1 TABLET DAILY 90 tablet 3   Current Facility-Administered Medications on File Prior to Visit  Medication Dose Route Frequency Provider Last Rate Last Admin  . 0.9 %  sodium chloride infusion  500 mL Intravenous Once Katie Mayer, MD         Past Surgical History:  Procedure Laterality Date  . BREAST LUMPECTOMY Left 06/2017  . BREAST LUMPECTOMY WITH RADIOACTIVE SEED AND SENTINEL LYMPH NODE BIOPSY Left 06/29/2017   Procedure: LEFT BREAST LUMPECTOMY WITH RADIOACTIVE SEED AND LEFT AXILLARY SENTINEL LYMPH NODE BIOPSY ERAS PATHWAY;  Surgeon: Katie Bookbinder, MD;  Location: [facility];  Service: General;  Laterality: Left;  . COLONOSCOPY  02/2016   polyps; Dr. Silvano Robinson  . EVACUATION BREAST HEMATOMA Left 07/11/2017   Procedure: EVACUATION HEMATOMA BREAST;  Surgeon: Katie Ruff,  MD;  Location: [facility];  Service: General;  Laterality: Left;  . FOOT TENDON SURGERY Left 2013  . NAVEL REMOVED     AS A CHILD  . OTHER SURGICAL HISTORY     biopsy for possible sarcoidosis  . POLYPECTOMY    . SKIN BIOPSY    . TONSILLECTOMY    . TOTAL ABDOMINAL HYSTERECTOMY  2002   total; Dr. Molli Robinson  . WISDOM TOOTH EXTRACTION        Objective: Ht 6' (1.829 m)   Wt 234 lb (106.1 kg)   BMI 31.74 kg/m   Gen: wd, wn, nad, African American female Not examined in person as this is a virtual consult     Assessment: Encounter Diagnoses  Name Primary?  . Essential hypertension Yes  . Mild persistent asthma without complication   . Allergic rhinitis due to pollen, unspecified seasonality   . Moderate persistent asthma without complication   . Left sided ulcerative (chronic) colitis ([place])   . Vitamin D deficiency   . Hx of adenomatous and sessile serrated colonic polyps   . Long-term use of immunosuppressant medication - Humira   . History of breast cancer   . Vaccine counseling   . Alkaline phosphatase elevation   . Encounter for hepatitis C screening test for low risk patient      Plan: We discussed her asthma concerns.   Begin back on qvar for prevention, c/t albuterol prn.   Discussed preventative vs acute therapy.  Discussed when to contact us for worsening issues.  Return soon for fasting labs, nurse visit  discussed limitations of virtual consult.   I reviewed her chart record, updated chart record, reviewed cancer screenings which are up-to-date  Refill medications as below  [name] was seen today for medication management.  Diagnoses and all orders for this visit:  Essential hypertension -     amLODipine (NORVASC) 5 MG tablet; Take 1 tablet (5 mg total) by mouth daily.  Mild persistent asthma without complication  Allergic rhinitis due to pollen, unspecified seasonality  Moderate persistent asthma without complication  Left sided ulcerative  (chronic) colitis ([facility])  Vitamin D deficiency  Hx of adenomatous and sessile serrated colonic polyps  Long-term use of immunosuppressant medication - Humira  History of breast cancer  Vaccine counseling  Alkaline phosphatase elevation  Encounter for hepatitis C screening test for low risk patient  Other orders -     beclomethasone (QVAR REDIHALER) 40 MCG/ACT inhaler; Inhale 1 puff into the lungs 2 (two) times daily. -     Cholecalciferol (VITAMIN D3) 25 MCG (1000 UT) CAPS; Take 2 capsules (2,000 Units total) by mouth daily. -     albuterol (VENTOLIN HFA) 108 (90 Base) MCG/ACT inhaler; TAKE 2 PUFFS BY MOUTH EVERY 6 HOURS AS NEEDED FOR WHEEZE OR SHORTNESS OF BREATH

## 2019-07-03 NOTE — Telephone Encounter (Signed)
Please help her get appt for covid vaccine if possible.   (see veronica about appt availability)

## 2019-07-07 NOTE — Telephone Encounter (Signed)
Patient has been informed that at this time we only have information for covid vaccine for patient that are 65 and up

## 2019-07-12 NOTE — Progress Notes (Signed)
[facility]  [phone] [phone]     ID: Katie Robinson [employer] DOB: [birth year]  MR#: [id number]  SKA#:[id number]  Patient Care Team: Caryl Ada as PCP - General (Family Medicine) Braxtyn Dorff, Virgie Dad, MD as Consulting Physician (Oncology) Gatha Mayer, MD as Consulting Physician (Gastroenterology) Eppie Gibson, MD as Attending Physician (Radiation Oncology) Rolm Bookbinder, MD as Consulting Physician (General Surgery) Delice Bison, Charlestine Massed, NP as Nurse Practitioner (Hematology and Oncology) OTHER MD:  CHIEF COMPLAINT: DCIS estrogen receptor positive  CURRENT TREATMENT: Anastrozole   INTERVAL HISTORY: Katie Robinson returns today for follow-up of her estrogen receptor positive breast cancer.   The patient continues on anastrozole.  She has mild hot flashes which she does not believe require any intervention.  Vaginal dryness is not an issue.  Katie Robinson's last bone density screening on 12/27/2017, showed a T-score of -0.7, which is considered normal.    Since her last visit, she underwent bilateral diagnostic mammography with tomography at [facility] on 06/12/2019 showing: breast density category C; no evidence of malignancy in either breast.    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  Everybody was in their home ordering packages, it was the Christmas season, they removed some machines that were helping, and they eliminated over time.  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  She is not exercising outside of work and does not quite know how many steps she takes during the day but it is not a desk job.  She is taking appropriate pandemic precautions.  A detailed review of systems today was otherwise stable.   HISTORY OF CURRENT ILLNESS: From the original intake  note:  Katie Robinson had routine screening mammography on 06/07/2017 showing a possible abnormality in the left breast. She underwent unilateral left breast diagnostic mammography with tomography and left axilla ultrasonography on 06/17/2017 at [facility] on 06/10/2017 showing: Suspicious, grouped left breast calcifications. Negative for left axillary lymphadenopathy.  Accordingly on 06/14/2017 she proceeded to biopsy of the left breast area in question. 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 Focus Highly suspicious for stromal invasion. Prognostic indicators significant for: estrogen receptor, 50% positive with moderate staining intensity and progesterone receptor, 0% negative.  The patient's subsequent history is as detailed below.   PAST MEDICAL HISTORY: Past Medical History:  Diagnosis Date  . Allergy    SEASONAL  . Anemia   . Arthralgia   . Asthma    controlled  . Breast cancer, left breast ([facility]) 05/2017   left breast cancer  . Chronic headache    many years  . Family history of premature coronary artery disease    father  . Former smoker    10 pack year history  . GERD (gastroesophageal reflux disease)    mild, occasional  . H/O bone density study 11/2015   normal  . H/O cardiovascular stress test 2009  . History of radiation therapy 08/12/17- 09/08/17   Left Breast 40.05 Gy in 15 fractions, Left Breast Boost/ 10 Gy in 5 fractions.   Marland Kitchen Hx of adenomatous and sessile serrated colonic polyps 05/24/2014  . Iron deficiency 11/29/2015  . Obesity   . Pectus excavatum    mild, left asymmetric  . Personal history  of radiation therapy   . S/P hysterectomy    due to fibroids  . Seasonal allergic rhinitis   . UC (ulcerative colitis) ([facility] [facility]) Aug 27, 2013   Dr. Carlean Purl  . Urinary tract infection 08-28-2011  . UTI due to extended-spectrum beta lactamase (ESBL) producing  Escherichia coli 09/20/2017  . Vitamin D deficiency 07/18/2014  . Wears glasses     PAST SURGICAL HISTORY: Past Surgical History:  Procedure Laterality Date  . BREAST LUMPECTOMY Left 06/2017  . BREAST LUMPECTOMY WITH RADIOACTIVE SEED AND SENTINEL LYMPH NODE BIOPSY Left 06/29/2017   Procedure: LEFT BREAST LUMPECTOMY WITH RADIOACTIVE SEED AND LEFT AXILLARY SENTINEL LYMPH NODE BIOPSY ERAS PATHWAY;  Surgeon: Rolm Bookbinder, MD;  Location: [facility];  Service: General;  Laterality: Left;  . COLONOSCOPY  02/2016   polyps; Dr. Silvano Rusk  . EVACUATION BREAST HEMATOMA Left 07/11/2017   Procedure: EVACUATION HEMATOMA BREAST;  Surgeon: Leighton Ruff, MD;  Location: [facility];  Service: General;  Laterality: Left;  . FOOT TENDON SURGERY Left 08/28/11  . NAVEL REMOVED     AS A CHILD  . OTHER SURGICAL HISTORY     biopsy for possible sarcoidosis  . POLYPECTOMY    . SKIN BIOPSY    . TONSILLECTOMY    . TOTAL ABDOMINAL HYSTERECTOMY  2000-08-27   total; Dr. Molli Posey  . WISDOM TOOTH EXTRACTION      FAMILY HISTORY Family History  Problem Relation Age of Onset  . Asthma Father   . Heart disease Father 43       died of MI mid 08-27-2022  . Hypertension Sister   . Asthma Sister   . Hypertension Mother   . Aneurysm Mother        brain  . Alzheimer's disease Mother   . Heart disease Mother   . Cancer Neg Hx   . Stroke Neg Hx   . Diabetes Neg Hx   . Colon cancer Neg Hx   . Colon polyps Neg Hx   . Crohn's disease Neg Hx   . Ulcerative colitis Neg Hx   Mother passed at due to brain aneurysm and she also had Alzhimer's. Father passed at 80 due to heart attack and asthma. She has 1 living sister with good health. No history with breast or ovarian cancer in the family.   GYNECOLOGIC HISTORY:  No LMP recorded. Patient has had a hysterectomy. Menarche: 64 years old Age at first live birth: 64 years old GP: GxP2 HRT: Did not use hormone replacement She has a total hysterectomy with  bilateral salpingo-oophorectomy in 2000-08-27 due to fibroids and a chocolate cyst on her ovaries.   SOCIAL HISTORY:  She works as a Scientist, research (medical). She is divorced. Her son lives with her and is unmarried. He works at [employer] as a Technical brewer. Her daughter is a Chiropractor at [employer].  The patient has 1 grandchild. She belongs to [employer].    ADVANCED DIRECTIVES: Not in place; at the 06/23/2017 visit the patient was given the appropriate documents to complete on notarized at her discretion   HEALTH MAINTENANCE: Social History   Tobacco Use  . Smoking status: Former Smoker    Types: Cigarettes    Quit date: 06/15/1988    Years since quitting: 31.0  . Smokeless tobacco: Never Used  Substance Use Topics  . Alcohol use: Yes    Alcohol/week: 0.0 standard drinks    Comment: rarely  . Drug use: No     Colonoscopy:  11/25/2017/ [facility]/ normal  PAP: Status post Hysterectomy/ BSO 2002  Bone density: 12/02/2015 T-score +0.2 normal   No Known Allergies  Current Outpatient Medications  Medication Sig Dispense Refill  . albuterol (VENTOLIN HFA) 108 (90 Base) MCG/ACT inhaler TAKE 2 PUFFS BY MOUTH EVERY 6 HOURS AS NEEDED FOR WHEEZE OR SHORTNESS OF BREATH 18 g 1  . amLODipine (NORVASC) 5 MG tablet Take 1 tablet (5 mg total) by mouth daily. 90 tablet 1  . anastrozole (ARIMIDEX) 1 MG tablet TAKE 1 TABLET DAILY 90 tablet 3  . beclomethasone (QVAR REDIHALER) 40 MCG/ACT inhaler Inhale 1 puff into the lungs 2 (two) times daily. 10.6 g 5  . Cholecalciferol (VITAMIN D3) 25 MCG (1000 UT) CAPS Take 2 capsules (2,000 Units total) by mouth daily. 180 capsule 1  . HUMIRA 40 MG/0.8ML PSKT INJECT 40 MG (0.8 ML) UNDER THE SKIN EVERY 14 DAYS 2 each 12  . loratadine (CLARITIN) 10 MG tablet TAKE 1 TABLET DAILY 90 tablet 3   Current Facility-Administered Medications  Medication Dose Route Frequency Provider Last Rate Last Admin  . 0.9 %  sodium chloride infusion  500 mL  Intravenous Once Gatha Mayer, MD        OBJECTIVE: Middle-aged African-American woman in no acute distress  Vitals:   07/13/19 1329  BP: (!) 142/92  Pulse: 76  Resp: 17  Temp: 97.8 F (36.6 C)  SpO2: 100%     Body mass index is 31.4 kg/m.   Wt Readings from Last 3 Encounters:  07/13/19 231 lb 8 oz (105 kg)  07/03/19 234 lb (106.1 kg)  09/15/18 225 lb (102.1 kg)      ECOG FS:1 - Symptomatic but completely ambulatory  Sclerae unicteric, EOMs intact Wearing a mask No cervical or supraclavicular adenopathy Lungs no rales or rhonchi Heart regular rate and rhythm Abd soft, nontender, positive bowel sounds MSK no focal spinal tenderness, no upper extremity lymphedema Neuro: nonfocal, well oriented, appropriate affect Breasts: The right breast is unremarkable.  The left breast has undergone lumpectomy followed by radiation.  There is some induration and minimal hyperpigmentation.  The breast is slightly smaller than the right.  There is no evidence of local recurrence.  Both axillae are benign.   LAB RESULTS:  CMP     Component Value Date/Time   NA 140 08/17/2018 1138   NA 141 05/11/2018 1445   K 3.8 08/17/2018 1138   CL 106 08/17/2018 1138   CO2 27 08/17/2018 1138   GLUCOSE 89 08/17/2018 1138   BUN 15 08/17/2018 1138   BUN 8 05/11/2018 1445   CREATININE 0.74 08/17/2018 1138   CREATININE 0.59 05/10/2017 0942   CALCIUM 10.3 08/22/2018 1130   PROT 7.2 08/17/2018 1138   PROT 6.9 05/18/2018 1430   ALBUMIN 4.2 08/17/2018 1138   ALBUMIN 4.3 05/11/2018 1445   AST 12 08/17/2018 1138   ALT 12 08/17/2018 1138   ALKPHOS 152 (H) 08/17/2018 1138   BILITOT 0.4 08/17/2018 1138   BILITOT 0.4 05/11/2018 1445   GFRNONAA 91 05/11/2018 1445   GFRAA 105 05/11/2018 1445    Lab Results  Component Value Date   ALBUMINELP 3.7 05/18/2018   MSPIKE Not Observed 05/18/2018    No results found for: KPAFRELGTCHN, LAMBDASER, KAPLAMBRATIO  Lab Results  Component Value Date   WBC  7.7 05/11/2018   NEUTROABS 2.8 05/11/2018   HGB 13.4 05/11/2018   HCT 39.4 05/11/2018   MCV 83 05/11/2018   PLT 259 05/11/2018   No  results found for: LABCA2  No components found for: TTSVXB939  No results for input(s): INR in the last 168 hours.  No results found for: LABCA2  No results found for: QZE092  No results found for: ZRA076  No results found for: AUQ333  No results found for: CA2729  No components found for: HGQUANT  No results found for: CEA1 / No results found for: CEA1   No results found for: AFPTUMOR  No results found for: CHROMOGRNA  No results found for: HGBA, HGBA2QUANT, HGBFQUANT, HGBSQUAN (Hemoglobinopathy evaluation)   No results found for: LDH  Lab Results  Component Value Date   IRON 45 04/05/2016   TIBC 141 (L) 04/05/2016   IRONPCTSAT 32 (H) 04/05/2016   (Iron and TIBC)  Lab Results  Component Value Date   FERRITIN 68 04/05/2016    Urinalysis    Component Value Date/Time   COLORURINE YELLOW 09/16/2017 1220   APPEARANCEUR CLEAR 09/16/2017 1220   LABSPEC 1.010 05/11/2018 1402   PHURINE 6.5 09/16/2017 1220   GLUCOSEU NEGATIVE 09/16/2017 1220   HGBUR TRACE-INTACT (A) 09/16/2017 1220   BILIRUBINUR negative 05/11/2018 1402   BILIRUBINUR n 04/29/2015 1218   KETONESUR negative 05/11/2018 1402   KETONESUR NEGATIVE 09/16/2017 1220   PROTEINUR negative 05/11/2018 1402   PROTEINUR NEGATIVE 04/04/2016 1552   UROBILINOGEN 0.2 09/16/2017 1220   NITRITE Negative 05/11/2018 1402   NITRITE NEGATIVE 09/16/2017 1220   LEUKOCYTESUR Negative 05/11/2018 1402     STUDIES: No results found.  ELIGIBLE FOR AVAILABLE RESEARCH PROTOCOL: No   ASSESSMENT: 64 y.o. [place] woman status post left breast lower inner quadrant biopsy 06/14/2017 for ductal carcinoma in situ, grade 3, with an area suspicious for microinvasion, estrogen receptor positive progesterone receptor negative  (1) status post left lumpectomy and sentinel lymph node sampling  06/29/2017 showing no residual carcinoma, with clear margins  (a) a total of 5 sentinel lymph nodes were removed  (2) adjuvant radiation 08/12/17 - 09/08/17 Site/dose:    1) Left Breast / 40.05 Gy in 15 fractions 2) Left Breast Boost / 10 Gy in 5 fractions  (3) anastrozole started April 2019  (a) bone density 12/27/2017 normal with a T score of -0.9   PLAN: Susette is now 2 years out from definitive surgery for her breast cancer with no evidence of disease recurrence.  This is favorable.  She is tolerating anastrozole well and the plan will be to continue that a total of 5 years.  We reviewed the fact that her cancer was not invasive and therefore not life-threatening.  She is primarily taking the antiestrogens as a preventative.  She is very motivated to continue.  She will need a repeat bone density with her next mammogram in December and I have written that order  She knows to call for any other issue that may develop before then.  Total encounter time 25 minutes.*   Marriah Sanderlin, Virgie Dad, MD  07/13/19 1:33 PM Medical Oncology and Hematology [facility] [place], [place] [address] Tel. [phone]    Fax. [phone]   I, Wilburn Mylar, am acting as scribe for Dr. Virgie Dad. Helios Kohlmann.  I, Lurline Del MD, have reviewed the above documentation for accuracy and completeness, and I agree with the above.    *Total Encounter Time as defined by the Centers for Medicare and Medicaid Services includes, in addition to the face-to-face time of a patient visit (documented in the note above) non-face-to-face time: obtaining and reviewing outside history, ordering  and reviewing medications, tests or procedures, care coordination (communications with other health care professionals or caregivers) and documentation in the medical record.

## 2019-07-13 ENCOUNTER — Telehealth: Payer: Self-pay | Admitting: *Deleted

## 2019-07-13 ENCOUNTER — Other Ambulatory Visit: Payer: Self-pay

## 2019-07-13 ENCOUNTER — Inpatient Hospital Stay: Payer: POS | Attending: Oncology | Admitting: Oncology

## 2019-07-13 VITALS — BP 142/92 | HR 76 | Temp 97.8°F | Resp 17 | Ht 72.0 in | Wt 231.5 lb

## 2019-07-13 DIAGNOSIS — Z818 Family history of other mental and behavioral disorders: Secondary | ICD-10-CM | POA: Insufficient documentation

## 2019-07-13 DIAGNOSIS — Z79899 Other long term (current) drug therapy: Secondary | ICD-10-CM | POA: Insufficient documentation

## 2019-07-13 DIAGNOSIS — Z170 Estrogen receptor positive status [ER+]: Secondary | ICD-10-CM

## 2019-07-13 DIAGNOSIS — Z90722 Acquired absence of ovaries, bilateral: Secondary | ICD-10-CM | POA: Diagnosis not present

## 2019-07-13 DIAGNOSIS — Z86010 Personal history of colonic polyps: Secondary | ICD-10-CM | POA: Insufficient documentation

## 2019-07-13 DIAGNOSIS — I808 Phlebitis and thrombophlebitis of other sites: Secondary | ICD-10-CM

## 2019-07-13 DIAGNOSIS — R232 Flushing: Secondary | ICD-10-CM | POA: Insufficient documentation

## 2019-07-13 DIAGNOSIS — R3129 Other microscopic hematuria: Secondary | ICD-10-CM

## 2019-07-13 DIAGNOSIS — Z836 Family history of other diseases of the respiratory system: Secondary | ICD-10-CM | POA: Diagnosis not present

## 2019-07-13 DIAGNOSIS — E559 Vitamin D deficiency, unspecified: Secondary | ICD-10-CM | POA: Diagnosis not present

## 2019-07-13 DIAGNOSIS — D0512 Intraductal carcinoma in situ of left breast: Secondary | ICD-10-CM

## 2019-07-13 DIAGNOSIS — J45909 Unspecified asthma, uncomplicated: Secondary | ICD-10-CM | POA: Insufficient documentation

## 2019-07-13 DIAGNOSIS — Z87891 Personal history of nicotine dependence: Secondary | ICD-10-CM | POA: Diagnosis not present

## 2019-07-13 DIAGNOSIS — Z923 Personal history of irradiation: Secondary | ICD-10-CM | POA: Diagnosis not present

## 2019-07-13 DIAGNOSIS — Z8719 Personal history of other diseases of the digestive system: Secondary | ICD-10-CM | POA: Diagnosis not present

## 2019-07-13 DIAGNOSIS — C50312 Malignant neoplasm of lower-inner quadrant of left female breast: Secondary | ICD-10-CM

## 2019-07-13 DIAGNOSIS — Z79811 Long term (current) use of aromatase inhibitors: Secondary | ICD-10-CM | POA: Insufficient documentation

## 2019-07-13 DIAGNOSIS — K5150 Left sided colitis without complications: Secondary | ICD-10-CM

## 2019-07-13 DIAGNOSIS — I10 Essential (primary) hypertension: Secondary | ICD-10-CM

## 2019-07-13 DIAGNOSIS — Z8249 Family history of ischemic heart disease and other diseases of the circulatory system: Secondary | ICD-10-CM | POA: Insufficient documentation

## 2019-07-13 NOTE — Telephone Encounter (Signed)
PA sent to [facility] through cover my meds.

## 2019-07-14 ENCOUNTER — Telehealth: Payer: Self-pay | Admitting: Oncology

## 2019-07-14 NOTE — Telephone Encounter (Signed)
I left a message regarding schedule  

## 2019-07-21 ENCOUNTER — Other Ambulatory Visit: Payer: Self-pay | Admitting: Medical

## 2019-07-21 DIAGNOSIS — I10 Essential (primary) hypertension: Secondary | ICD-10-CM

## 2019-08-02 ENCOUNTER — Telehealth: Payer: Self-pay | Admitting: Internal Medicine

## 2019-08-02 ENCOUNTER — Other Ambulatory Visit: Payer: Self-pay | Admitting: Medical

## 2019-08-02 MED ORDER — ALBUTEROL SULFATE HFA 108 (90 BASE) MCG/ACT IN AERS: INHALATION_SPRAY | RESPIRATORY_TRACT | 0 refills | Status: DC

## 2019-08-02 NOTE — Telephone Encounter (Signed)
Albuterol request for 46mg to [facility] instead of 18g

## 2019-08-21 ENCOUNTER — Telehealth: Payer: Self-pay | Admitting: Internal Medicine

## 2019-08-21 NOTE — Telephone Encounter (Signed)
Pt stated that she has UC and inquired whether it is safe to get covid vaccine.

## 2019-08-21 NOTE — Telephone Encounter (Signed)
I recommend that she get the vaccine if able from GI perspective  Any other ? Can ask PCP

## 2019-08-21 NOTE — Telephone Encounter (Signed)
Patient is advised.  

## 2019-11-09 ENCOUNTER — Other Ambulatory Visit: Payer: Self-pay | Admitting: Oncology

## 2019-11-15 ENCOUNTER — Other Ambulatory Visit: Payer: Self-pay | Admitting: Internal Medicine

## 2019-11-15 ENCOUNTER — Other Ambulatory Visit: Payer: Self-pay

## 2019-11-15 DIAGNOSIS — Z7960 Long term (current) use of unspecified immunomodulators and immunosuppressants: Secondary | ICD-10-CM

## 2019-11-15 DIAGNOSIS — K5150 Left sided colitis without complications: Secondary | ICD-10-CM

## 2019-11-15 NOTE — Progress Notes (Signed)
Quant

## 2020-03-05 ENCOUNTER — Telehealth: Payer: Self-pay | Admitting: Internal Medicine

## 2020-03-05 MED ORDER — HUMIRA 40 MG/0.8ML ~~LOC~~ PSKT: 40.0000 mg | PREFILLED_SYRINGE | SUBCUTANEOUS | 3 refills | Status: DC

## 2020-03-05 NOTE — Telephone Encounter (Signed)
Patient requesting Humira

## 2020-03-05 NOTE — Telephone Encounter (Signed)
rx sent Message left for patient to call back if she has additional questions and asked to make sure she keeps upcoming appt for Oct.

## 2020-03-06 ENCOUNTER — Telehealth: Payer: Self-pay | Admitting: Internal Medicine

## 2020-03-06 NOTE — Telephone Encounter (Signed)
Duplicate message. 

## 2020-03-06 NOTE — Telephone Encounter (Signed)
Left message for patient to call back  

## 2020-03-06 NOTE — Telephone Encounter (Signed)
Patient was calling to confirm Humira was sent.  She will keep her appt with Dr. Carlean Purl in October

## 2020-03-12 ENCOUNTER — Other Ambulatory Visit: Payer: Self-pay | Admitting: Medical

## 2020-03-12 DIAGNOSIS — I10 Essential (primary) hypertension: Secondary | ICD-10-CM

## 2020-03-26 ENCOUNTER — Ambulatory Visit: Payer: POS | Attending: Internal Medicine

## 2020-03-26 DIAGNOSIS — Z23 Encounter for immunization: Secondary | ICD-10-CM

## 2020-03-26 NOTE — Progress Notes (Signed)
   Covid-19 Vaccination [facility]  Name:  Katie Robinson    MRN: [id number] DOB: Oct 28, 1955  03/26/2020  Ms. Katie Robinson was observed post Covid-19 immunization for 15 minutes without incident. She was provided with Vaccine Information Sheet and instruction to access the V-Safe system.   Ms. Katie Robinson was instructed to call 911 with any severe reactions post vaccine: Marland Kitchen Difficulty breathing  . Swelling of face and throat  . A fast heartbeat  . A bad rash all over body  . Dizziness and weakness

## 2020-03-28 ENCOUNTER — Other Ambulatory Visit: Payer: POS

## 2020-03-28 DIAGNOSIS — Z7960 Long term (current) use of unspecified immunomodulators and immunosuppressants: Secondary | ICD-10-CM

## 2020-03-28 DIAGNOSIS — Z79899 Other long term (current) drug therapy: Secondary | ICD-10-CM

## 2020-03-28 DIAGNOSIS — K5150 Left sided colitis without complications: Secondary | ICD-10-CM

## 2020-03-30 LAB — QUANTIFERON-TB GOLD PLUS
Mitogen-NIL: 9.55 IU/mL
NIL: 0.13 IU/mL
QuantiFERON-TB Gold Plus: NEGATIVE
TB1-NIL: <0 IU/mL
TB2-NIL: 0 IU/mL

## 2020-04-02 ENCOUNTER — Other Ambulatory Visit (INDEPENDENT_AMBULATORY_CARE_PROVIDER_SITE_OTHER): Payer: POS

## 2020-04-02 ENCOUNTER — Ambulatory Visit (INDEPENDENT_AMBULATORY_CARE_PROVIDER_SITE_OTHER): Payer: POS | Admitting: Internal Medicine

## 2020-04-02 ENCOUNTER — Encounter: Payer: Self-pay | Admitting: Internal Medicine

## 2020-04-02 DIAGNOSIS — E559 Vitamin D deficiency, unspecified: Secondary | ICD-10-CM

## 2020-04-02 DIAGNOSIS — Z7960 Long term (current) use of unspecified immunomodulators and immunosuppressants: Secondary | ICD-10-CM

## 2020-04-02 DIAGNOSIS — Z79899 Other long term (current) drug therapy: Secondary | ICD-10-CM

## 2020-04-02 DIAGNOSIS — K5150 Left sided colitis without complications: Secondary | ICD-10-CM

## 2020-04-02 LAB — CBC WITH DIFFERENTIAL/PLATELET
Basophils Absolute: 0.1 10*3/uL (ref 0.0–0.1)
Basophils Relative: 0.7 % (ref 0.0–3.0)
Eosinophils Absolute: 0.3 10*3/uL (ref 0.0–0.7)
Eosinophils Relative: 3.2 % (ref 0.0–5.0)
HCT: 41.8 % (ref 36.0–46.0)
Hemoglobin: 13.7 g/dL (ref 12.0–15.0)
Lymphocytes Relative: 52.4 % — ABNORMAL HIGH (ref 12.0–46.0)
Lymphs Abs: 4.8 10*3/uL — ABNORMAL HIGH (ref 0.7–4.0)
MCHC: 32.9 g/dL (ref 30.0–36.0)
MCV: 84.3 fl (ref 78.0–100.0)
Monocytes Absolute: 0.7 10*3/uL (ref 0.1–1.0)
Monocytes Relative: 8.2 % (ref 3.0–12.0)
Neutro Abs: 3.3 10*3/uL (ref 1.4–7.7)
Neutrophils Relative %: 35.5 % — ABNORMAL LOW (ref 43.0–77.0)
Platelets: 287 10*3/uL (ref 150.0–400.0)
RBC: 4.96 Mil/uL (ref 3.87–5.11)
RDW: 13.6 % (ref 11.5–15.5)
WBC: 9.2 10*3/uL (ref 4.0–10.5)

## 2020-04-02 LAB — COMPREHENSIVE METABOLIC PANEL
ALT: 13 U/L (ref 0–35)
AST: 14 U/L (ref 0–37)
Albumin: 4.3 g/dL (ref 3.5–5.2)
Alkaline Phosphatase: 142 U/L — ABNORMAL HIGH (ref 39–117)
BUN: 13 mg/dL (ref 6–23)
CO2: 24 mEq/L (ref 19–32)
Calcium: 10.9 mg/dL — ABNORMAL HIGH (ref 8.4–10.5)
Chloride: 106 mEq/L (ref 96–112)
Creatinine, Ser: 0.63 mg/dL (ref 0.40–1.20)
GFR: 95.34 mL/min (ref >60.00)
Glucose, Bld: 77 mg/dL (ref 70–99)
Potassium: 4 mEq/L (ref 3.5–5.1)
Sodium: 139 mEq/L (ref 135–145)
Total Bilirubin: 0.4 mg/dL (ref 0.2–1.2)
Total Protein: 7.5 g/dL (ref 6.0–8.3)

## 2020-04-02 LAB — VITAMIN D 25 HYDROXY (VIT D DEFICIENCY, FRACTURES): VITD: 40.73 ng/mL (ref 30.00–100.00)

## 2020-04-02 NOTE — Assessment & Plan Note (Signed)
Recheck level

## 2020-04-02 NOTE — Assessment & Plan Note (Signed)
Tolerating well.  Recheck QuantiFERON was negative.

## 2020-04-02 NOTE — Patient Instructions (Addendum)
If you are age 63 or older, your body mass index should be between 23-30. Your Body mass index is 32.08 kg/m. If this is out of the aforementioned range listed, please consider follow up with your Primary Care Provider.  If you are age 80 or younger, your body mass index should be between 19-25. Your Body mass index is 32.08 kg/m. If this is out of the aformentioned range listed, please consider follow up with your Primary Care Provider.   Your provider has requested that you go to the basement level for lab work before leaving today. Press "B" on the elevator. The lab is located at the first door on the left as you exit the elevator.  Due to recent changes in healthcare laws, you may see the results of your imaging and laboratory studies on MyChart before your provider has had a chance to review them.  We understand that in some cases there may be results that are confusing or concerning to you. Not all laboratory results come back in the same time frame and the provider may be waiting for multiple results in order to interpret others.  Please give [place] 48 hours in order for your provider to thoroughly review all the results before contacting the office for clarification of your results.   You will be seen in follow up in 6 to 12 months.  We will call you to make the appointment.  Thank you for entrusting me with your care and choosing [facility].  Dr Carlean Purl

## 2020-04-02 NOTE — Progress Notes (Signed)
Katie Robinson 64 y.o. Sep 12, 1955 [id number]  Assessment & Plan:  Left sided ulcerative (chronic) colitis (Katie Robinson) Doing well on weekly Humira still.  Follow-up in 6 to 12 months continue Humira. Orders Placed This Encounter  Procedures  . CBC with Differential/Platelet  . Comp Met (CMET)  . Vitamin D (25 hydroxy)     Long-term use of immunosuppressant medication - Humira Tolerating well.  Recheck QuantiFERON was negative.  Vitamin D deficiency Recheck level    CC: Katie Robinson, Katie Eng, PA-C   Subjective:   Chief Complaint: Follow-up of left-sided ulcerative colitis  HPI Katie Robinson is a 64 year old African-American woman on weekly Humira for left-sided ulcerative colitis, in endoscopic and pathologic remission at 2019 colonoscopy.  She also has a history of polyps and we plan for a repeat colonoscopy in 2024 on a routine basis.  She reports occasional stomach upset or diarrhea which she relates to foods no persistent symptoms at any point and feels like she is doing well with her colitis.  Has gotten the booster shot for the [place] vaccine recently. No Known Allergies Current Meds  Medication Sig  . Adalimumab (HUMIRA) 40 MG/0.8ML PSKT Inject 0.8 mLs (40 mg total) into the skin every 14 (fourteen) days.  Marland Kitchen albuterol (VENTOLIN HFA) 108 (90 Base) MCG/ACT inhaler TAKE 2 PUFFS BY MOUTH EVERY 6 HOURS AS NEEDED FOR WHEEZE OR SHORTNESS OF BREATH  . amLODipine (NORVASC) 5 MG tablet TAKE 1 TABLET BY MOUTH EVERY DAY  . anastrozole (ARIMIDEX) 1 MG tablet TAKE 1 TABLET DAILY  . beclomethasone (QVAR REDIHALER) 40 MCG/ACT inhaler Inhale 1 puff into the lungs 2 (two) times daily.  . Cholecalciferol (VITAMIN D3) 25 MCG (1000 UT) CAPS Take 2 capsules (2,000 Units total) by mouth daily.   Current Facility-Administered Medications for the 04/02/20 encounter (Office Visit) with Katie Mayer, MD  Medication  . 0.9 %  sodium chloride infusion   Past Medical History:  Diagnosis  Date  . Allergy    SEASONAL  . Anemia   . Arthralgia   . Asthma    controlled  . Breast cancer, left breast (Katie Robinson) 05/2017   left breast cancer  . Chronic headache    many years  . Family history of premature coronary artery disease    father  . Former smoker    10 pack year history  . GERD (gastroesophageal reflux disease)    mild, occasional  . H/O bone density study 11/2015   normal  . H/O cardiovascular stress test 2009  . History of radiation therapy 08/12/17- 09/08/17   Left Breast 40.05 Gy in 15 fractions, Left Breast Boost/ 10 Gy in 5 fractions.   Marland Kitchen Hx of adenomatous and sessile serrated colonic polyps 05/24/2014  . Iron deficiency 11/29/2015  . Obesity   . Pectus excavatum    mild, left asymmetric  . Personal history of radiation therapy   . S/P hysterectomy    due to fibroids  . Seasonal allergic rhinitis   . UC (ulcerative colitis) ([place]) 2015   Dr. [name]  . Urinary tract infection 2013  . UTI due to extended-spectrum beta lactamase (ESBL) producing Escherichia coli 09/20/2017  . Vitamin D deficiency 07/18/2014  . Wears glasses    Past Surgical History:  Procedure Laterality Date  . BREAST LUMPECTOMY Left 06/2017  . BREAST LUMPECTOMY WITH RADIOACTIVE SEED AND SENTINEL LYMPH NODE BIOPSY Left 06/29/2017   Procedure: LEFT BREAST LUMPECTOMY WITH RADIOACTIVE SEED AND LEFT AXILLARY SENTINEL LYMPH NODE BIOPSY ERAS PATHWAY;  Surgeon: Katie Bookbinder, MD;  Location: [facility];  Rob[facility]: General;  Laterality: Left;  . COLONOSCOPY  02/2016   polyps; Dr. Silvano Robinson  . EVACUATION BREAST HEMATOMA Left 07/11/2017   Procedure: EVACUATION HEMATOMA BREAST;  Surgeon: Katie Ruff, MD;  Location: [facility];  Robinson: General;  Laterality: Left;  . FOOT TENDON SURGERY Left 2013  . NAVEL REMOVED     AS A CHILD  . OTHER SURGICAL HISTORY     biopsy for possible sarcoidosis  . POLYPECTOMY    . SKIN BIOPSY    . TONSILLECTOMY    . TOTAL ABDOMINAL HYSTERECTOMY  2002     total; Dr. Molli Robinson  . WISDOM TOOTH EXTRACTION     Social History   Social History Narrative   Lives with her son, has 2 adult children, not married, no exercise, active with walking on the job at the Katie [facility].   04/2018   family history includes Alzheimer's disease in her mother; Aneurysm in her mother; Asthma in her father and sister; Heart disease in her mother; Heart disease (age of onset: 70) in her father; Hypertension in her mother and sister.   Review of Systems  As per HPI  Objective:   Physical Exam BP 114/80 (BP Location: Left Arm, Patient Position: Sitting, Cuff Size: Large)   Pulse 80   Ht 6' (1.829 m)   Wt 236 lb 8 oz (107.3 kg)   BMI 32.08 kg/m  Lungs clear Heart sounds are normal Abdomen is soft nontender without organomegaly mass

## 2020-04-02 NOTE — Assessment & Plan Note (Signed)
Doing well on weekly Humira still.  Follow-up in 6 to 12 months continue Humira. Orders Placed This Encounter  Procedures  . CBC with Differential/Platelet  . Comp Met (CMET)  . Vitamin D (25 hydroxy)

## 2020-04-05 ENCOUNTER — Other Ambulatory Visit: Payer: Self-pay | Admitting: Medical

## 2020-04-05 DIAGNOSIS — I10 Essential (primary) hypertension: Secondary | ICD-10-CM

## 2020-04-08 ENCOUNTER — Telehealth: Payer: Self-pay

## 2020-04-08 ENCOUNTER — Telehealth: Payer: Self-pay | Admitting: Internal Medicine

## 2020-04-08 ENCOUNTER — Other Ambulatory Visit: Payer: Self-pay

## 2020-04-08 DIAGNOSIS — R748 Abnormal levels of other serum enzymes: Secondary | ICD-10-CM

## 2020-04-08 NOTE — Telephone Encounter (Signed)
Left message to please call back

## 2020-04-09 NOTE — Telephone Encounter (Signed)
See lab results for additional notes.

## 2020-04-16 ENCOUNTER — Ambulatory Visit (HOSPITAL_COMMUNITY): Payer: POS

## 2020-04-16 ENCOUNTER — Ambulatory Visit (HOSPITAL_COMMUNITY)
Admission: RE | Admit: 2020-04-16 | Discharge: 2020-04-16 | Disposition: A | Discharge status: Home / Self Care | Payer: POS | Source: Ambulatory Visit | Attending: Internal Medicine | Admitting: Internal Medicine

## 2020-04-16 ENCOUNTER — Other Ambulatory Visit (INDEPENDENT_AMBULATORY_CARE_PROVIDER_SITE_OTHER): Payer: POS

## 2020-04-16 ENCOUNTER — Other Ambulatory Visit: Payer: Self-pay

## 2020-04-16 DIAGNOSIS — R748 Abnormal levels of other serum enzymes: Secondary | ICD-10-CM | POA: Diagnosis not present

## 2020-04-16 LAB — PHOSPHORUS: Phosphorus: 3.5 mg/dL (ref 2.3–4.6)

## 2020-04-17 LAB — CALCIUM, IONIZED: Calcium, Ion: 5.9 mg/dL — ABNORMAL HIGH (ref 4.8–5.6)

## 2020-04-17 LAB — PTH, INTACT AND CALCIUM
Calcium: 10.6 mg/dL — ABNORMAL HIGH (ref 8.6–10.4)
PTH: 105 pg/mL — ABNORMAL HIGH (ref 14–64)

## 2020-04-18 ENCOUNTER — Telehealth: Payer: Self-pay | Admitting: Medical

## 2020-04-18 NOTE — Telephone Encounter (Signed)
Get in for med check of fasting physical if due

## 2020-04-18 NOTE — Telephone Encounter (Signed)
duplicate

## 2020-04-19 ENCOUNTER — Telehealth: Payer: Self-pay | Admitting: Internal Medicine

## 2020-04-19 DIAGNOSIS — E349 Endocrine disorder, unspecified: Secondary | ICD-10-CM

## 2020-04-19 LAB — INTACT PTH (INCLUDES CALCIUM)
Calcium, Serum: 10.7 mg/dL — ABNORMAL HIGH
PTH (Intact Assay): 60 pg/mL

## 2020-04-19 NOTE — Telephone Encounter (Signed)
Got pt scheduled for CPE

## 2020-04-19 NOTE — Telephone Encounter (Signed)
Katie Mayer, MD  Marlon Pel, RN Please tell [name] that her parathyroid hormone is high and that is driving her elevated calcium. It was not high last year for some reason maybe it was just taking its time getting there. Her ultrasound did not show bile duct problems it was stable with some cysts that have been seen before.   She needs to be referred to endocrinology regarding hypercalcemia and elevated PTH. Refer to [place]

## 2020-04-19 NOTE — Telephone Encounter (Signed)
Patient requesting results from [place]

## 2020-04-22 NOTE — Telephone Encounter (Signed)
Patient notified of the results and recommendations Referral placed for endocrinology

## 2020-04-26 ENCOUNTER — Encounter: Payer: Self-pay | Admitting: Endocrinology

## 2020-05-07 ENCOUNTER — Other Ambulatory Visit: Payer: Self-pay | Admitting: Medical

## 2020-05-07 DIAGNOSIS — I10 Essential (primary) hypertension: Secondary | ICD-10-CM

## 2020-05-15 ENCOUNTER — Ambulatory Visit (INDEPENDENT_AMBULATORY_CARE_PROVIDER_SITE_OTHER): Payer: POS | Admitting: Medical

## 2020-05-15 ENCOUNTER — Encounter: Payer: Self-pay | Admitting: Medical

## 2020-05-15 ENCOUNTER — Other Ambulatory Visit: Payer: Self-pay

## 2020-05-15 VITALS — BP 142/88 | HR 81 | Ht 72.0 in | Wt 244.2 lb

## 2020-05-15 DIAGNOSIS — J301 Allergic rhinitis due to pollen: Secondary | ICD-10-CM | POA: Diagnosis not present

## 2020-05-15 DIAGNOSIS — J4540 Moderate persistent asthma, uncomplicated: Secondary | ICD-10-CM | POA: Diagnosis not present

## 2020-05-15 DIAGNOSIS — Z780 Asymptomatic menopausal state: Secondary | ICD-10-CM

## 2020-05-15 DIAGNOSIS — R519 Headache, unspecified: Secondary | ICD-10-CM

## 2020-05-15 DIAGNOSIS — Z86010 Personal history of colonic polyps: Secondary | ICD-10-CM

## 2020-05-15 DIAGNOSIS — Z79899 Other long term (current) drug therapy: Secondary | ICD-10-CM

## 2020-05-15 DIAGNOSIS — R748 Abnormal levels of other serum enzymes: Secondary | ICD-10-CM

## 2020-05-15 DIAGNOSIS — Z7960 Long term (current) use of unspecified immunomodulators and immunosuppressants: Secondary | ICD-10-CM

## 2020-05-15 DIAGNOSIS — Z1159 Encounter for screening for other viral diseases: Secondary | ICD-10-CM

## 2020-05-15 DIAGNOSIS — G8929 Other chronic pain: Secondary | ICD-10-CM

## 2020-05-15 DIAGNOSIS — Z0000 Encounter for general adult medical examination without abnormal findings: Secondary | ICD-10-CM | POA: Diagnosis not present

## 2020-05-15 DIAGNOSIS — Z853 Personal history of malignant neoplasm of breast: Secondary | ICD-10-CM

## 2020-05-15 DIAGNOSIS — I10 Essential (primary) hypertension: Secondary | ICD-10-CM

## 2020-05-15 DIAGNOSIS — K5150 Left sided colitis without complications: Secondary | ICD-10-CM | POA: Diagnosis not present

## 2020-05-15 DIAGNOSIS — E2839 Other primary ovarian failure: Secondary | ICD-10-CM

## 2020-05-15 DIAGNOSIS — E559 Vitamin D deficiency, unspecified: Secondary | ICD-10-CM

## 2020-05-15 NOTE — Progress Notes (Signed)
Subjective: Chief Complaint  Patient presents with  . Annual Exam    with fasting labs    Medical team: Dr. Lurline Del, medical oncology Dr. Eppie Gibson, radiation oncology Dr. Silvano Rusk, GI Dr. Clement Husbands Eye doctor Dentist Kahley Leib, Camelia Eng, PA-C for primary care  Concerns:  Sees Dr. Jana Hakim on a yearly basis  Sees Dr. Carlean Purl regularly for ulcerative colitis.  At last GI visit, there was an abnormal lab.    Diagnosed with breast cancer 05/2017, left breast, had lumpectomy and radiation therapy.     Asthma - usually albuterol 1-2 x per week, not more.  She is status post hysterectomy and bilateral salpingo-oophorectomy  Past Medical History:  Diagnosis Date  . Allergy    SEASONAL  . Anemia   . Arthralgia   . Asthma    controlled  . Breast cancer, left breast ([place]) 05/2017   left breast cancer  . Chronic headache    many years  . Family history of premature coronary artery disease    father  . Former smoker    10 pack year history  . GERD (gastroesophageal reflux disease)    mild, occasional  . H/O bone density study 11/2015   normal  . H/O cardiovascular stress test August 15, 2007  . History of radiation therapy 08/12/17- 09/08/17   Left Breast 40.05 Gy in 15 fractions, Left Breast Boost/ 10 Gy in 5 fractions.   Marland Kitchen Hx of adenomatous and sessile serrated colonic polyps 05/24/2014  . Iron deficiency 11/29/2015  . Obesity   . Pectus excavatum    mild, left asymmetric  . Personal history of radiation therapy   . S/P hysterectomy    due to fibroids  . Seasonal allergic rhinitis   . UC (ulcerative colitis) ([place]) 08-14-2013   Dr. Carlean Purl  . Urinary tract infection Aug 15, 2011  . UTI due to extended-spectrum beta lactamase (ESBL) producing Escherichia coli 09/20/2017  . Vitamin D deficiency 07/18/2014  . Wears glasses     Past Surgical History:  Procedure Laterality Date  . BREAST LUMPECTOMY Left 06/2017  . BREAST LUMPECTOMY WITH RADIOACTIVE SEED AND SENTINEL LYMPH  NODE BIOPSY Left 06/29/2017   Procedure: LEFT BREAST LUMPECTOMY WITH RADIOACTIVE SEED AND LEFT AXILLARY SENTINEL LYMPH NODE BIOPSY ERAS PATHWAY;  Surgeon: Rolm Bookbinder, MD;  Location: [facility];  Service: General;  Laterality: Left;  . COLONOSCOPY  02/2016   polyps; Dr. Silvano Rusk  . EVACUATION BREAST HEMATOMA Left 07/11/2017   Procedure: EVACUATION HEMATOMA BREAST;  Surgeon: Leighton Ruff, MD;  Location: [facility];  Service: General;  Laterality: Left;  . FOOT TENDON SURGERY Left 08/15/11  . NAVEL REMOVED     AS A CHILD  . OTHER SURGICAL HISTORY     biopsy for possible sarcoidosis  . POLYPECTOMY    . SKIN BIOPSY    . TONSILLECTOMY    . TOTAL ABDOMINAL HYSTERECTOMY  08-14-00   total; Dr. Molli Posey  . WISDOM TOOTH EXTRACTION       Family History  Problem Relation Age of Onset  . Asthma Father   . Heart disease Father 62       died of MI mid 14-Aug-2022  . Hypertension Sister   . Asthma Sister   . Hypertension Mother   . Aneurysm Mother        brain  . Alzheimer's disease Mother   . Heart disease Mother   . Cancer Neg Hx   . Stroke Neg Hx   . Diabetes  Neg Hx   . Colon cancer Neg Hx   . Colon polyps Neg Hx   . Crohn's disease Neg Hx   . Ulcerative colitis Neg Hx      Current Outpatient Medications:  .  Adalimumab (HUMIRA) 40 MG/0.8ML PSKT, Inject 0.8 mLs (40 mg total) into the skin every 14 (fourteen) days., Disp: 2 each, Rfl: 3 .  albuterol (VENTOLIN HFA) 108 (90 Base) MCG/ACT inhaler, TAKE 2 PUFFS BY MOUTH EVERY 6 HOURS AS NEEDED FOR WHEEZE OR SHORTNESS OF BREATH, Disp: 60 g, Rfl: 0 .  amLODipine (NORVASC) 5 MG tablet, TAKE 1 TABLET BY MOUTH EVERY DAY, Disp: 30 tablet, Rfl: 0 .  anastrozole (ARIMIDEX) 1 MG tablet, TAKE 1 TABLET DAILY, Disp: 90 tablet, Rfl: 3 .  Cholecalciferol (VITAMIN D3) 25 MCG (1000 UT) CAPS, Take 2 capsules (2,000 Units total) by mouth daily., Disp: 180 capsule, Rfl: 1 .  beclomethasone (QVAR REDIHALER) 40 MCG/ACT inhaler, Inhale 1 puff  into the lungs 2 (two) times daily. (Patient not taking: Reported on 05/15/2020), Disp: 10.6 g, Rfl: 5  No Known Allergies    Objective: BP (!) 142/88   Pulse 81   Ht 6' (1.829 m)   Wt 244 lb 3.2 oz (110.8 kg)   SpO2 96%   BMI 33.12 kg/m   Wt Readings from Last 3 Encounters:  05/15/20 244 lb 3.2 oz (110.8 kg)  04/02/20 236 lb 8 oz (107.3 kg)  07/13/19 231 lb 8 oz (105 kg)   BP Readings from Last 3 Encounters:  05/15/20 (!) 142/88  04/02/20 114/80  07/13/19 (!) 142/92    General appearance: alert, no distress, WD/WN, AA female HEENT: normocephalic, sclerae anicteric, PERRLA, EOMi, nares patent, no discharge or erythema, pharynx normal Oral cavity: MMM, no lesions Neck: supple, no lymphadenopathy, no thyromegaly, no masses, no bruits Heart: RRR, normal S1, S2, no murmurs Lungs: CTA bilaterally, no wheezes, rhonchi, or rales Abdomen: +bs, soft, non tender, non distended, no masses, no hepatomegaly, no splenomegaly Back: non tender Musculoskeletal: nontender, no swelling, no obvious deformity Extremities: no edema, no cyanosis, no clubbing Pulses: 2+ symmetric, upper and lower extremities, normal cap refill Neurological: alert, oriented x 3, CN2-12 intact, strength normal upper extremities and lower extremities, sensation normal throughout, DTRs 2+ throughout, no cerebellar signs, gait normal Psychiatric: normal affect, behavior normal, pleasant   Breast: Left breast at the 8 o'clock position with generalized dense tissue since prior surgery, otherwise breast nontender, no masses or lumps, no skin changes, no nipple discharge or inversion, no axillary lymphadenopathy Gyn: Normal external genitalia without lesions, vagina with normal mucosa, s/p hysterectomy, no abnormal vaginal discharge.  nontender, no masses.  Exam chaperoned by nurse. Rectal: deferred   EKG Performed, and Reviewed today by me personally   Assessment: Encounter Diagnoses  Name Primary?  . Encounter for  health maintenance examination in adult Yes  . Essential hypertension   . Allergic rhinitis due to pollen, unspecified seasonality   . Moderate persistent asthma without complication   . Left sided ulcerative (chronic) colitis ([place])   . Alkaline phosphatase elevation   . History of breast cancer   . Encounter for hepatitis C screening test for low risk patient   . Hypercalcemia   . Hx of adenomatous and sessile serrated colonic polyps   . Long-term use of immunosuppressant medication - Humira   . Vitamin D deficiency   . Chronic nonintractable headache, unspecified headache type   . Post-menopausal   . Estrogen deficiency  Plan: Today you had a preventative care visit or wellness visit.    Topics today may have included healthy lifestyle, diet, exercise, preventative care, vaccinations, sick and well care, proper use of emergency dept and after hours care, as well as other concerns.     Recommendations: Continue to return yearly for your annual wellness and preventative care visits.  This gives [place] a chance to discuss healthy lifestyle, exercise, vaccinations, review your chart record, and perform screenings where appropriate.  I recommend you see your eye doctor yearly for routine vision care.  I recommend you see your dentist yearly for routine dental care including hygiene visits twice yearly.  See your specialist as usual    Vaccination recommendations were reviewed You are up to date on tetanus booster You are up to date on covid vaccine You are up to date on pneumonia vaccines You are up to date on shingles vaccine I recommend a yearly flu shot   Screening for cancer: Breast cancer screening: You should perform a self breast exam monthly.   We reviewed recommendations for regular mammograms and breast cancer screening.  Get your mammogram yearly  Colon cancer screening:  I reviewed your colonoscopy on file that is up to date from 2017  Cervical cancer  screening: You are status post hysterectomy   Skin cancer screening: Check your skin regularly for new changes, growing lesions, or other lesions of concern Come in for evaluation if you have skin lesions of concern.  Lung cancer screening: If you have a greater than 30 pack year history of tobacco use, then you qualify for lung cancer screening with a chest CT scan  We currently don't have screenings for other cancers besides breast, cervical, colon, and lung cancers.  If you have a strong family history of cancer or have other cancer screening concerns, please let me know.    Bone health: Get at least 150 minutes of aerobic exercise weekly Get weight bearing exercise at least once weekly Schedule bone density test  Please call to schedule your bone density test.   [facility] [address] [place], [place] [address]     Heart health: Get at least 150 minutes of aerobic exercise weekly Limit alcohol It is important to maintain a healthy blood pressure and healthy cholesterol numbers   Separate significant issues discussed: Hypertension-continue current medication  History of breast cancer-continue routine mammogram  History of ulcerative colitis-managed by gastroenterology  Vitamin D deficiency-continue supplement  Alkaline phosphatase elevated-discussed possible causes.  I suspect hers is due to both vitamin D deficiency and colitis, reviewed prior labs in the chart record and other specialist notes from gastroenterology  Asthma-continue albuterol as needed.  Discussed if worsening asthma symptoms and frequency may need to add back preventative inhaler  Chronic headaches-etiology unclear.  Consider neurology consult  Elevated PTH and calcium-follow-up with endocrinology as planned for new patient appointment soon   [place] was seen today for annual exam.  Diagnoses and all orders for this  visit:  Encounter for health maintenance examination in adult -     Lipid panel -     TSH -     Hepatitis C antibody -     HIV Antibody (routine testing w rflx) -     DG Bone Density; Future  Essential hypertension -     Lipid panel  Allergic rhinitis due to pollen, unspecified seasonality  Moderate persistent asthma without complication  Left sided ulcerative (  chronic) colitis ([facility])  Alkaline phosphatase elevation  History of breast cancer  Encounter for hepatitis C screening test for low risk patient -     Hepatitis C antibody  Hypercalcemia  Hx of adenomatous and sessile serrated colonic polyps  Long-term use of immunosuppressant medication - Humira  Vitamin D deficiency  Chronic nonintractable headache, unspecified headache type  Post-menopausal -     DG Bone Density; Future  Estrogen deficiency -     DG Bone Density; Future      F/u pending labs

## 2020-05-16 ENCOUNTER — Other Ambulatory Visit: Payer: Self-pay | Admitting: Medical

## 2020-05-16 LAB — LIPID PANEL
Chol/HDL Ratio: 2.2 ratio (ref 0.0–4.4)
Cholesterol, Total: 244 mg/dL — ABNORMAL HIGH (ref 100–199)
HDL: 109 mg/dL
LDL Chol Calc (NIH): 120 mg/dL — ABNORMAL HIGH (ref 0–99)
Triglycerides: 88 mg/dL (ref 0–149)
VLDL Cholesterol Cal: 15 mg/dL (ref 5–40)

## 2020-05-16 LAB — TSH: TSH: 0.954 u[IU]/mL (ref 0.450–4.500)

## 2020-05-16 LAB — HEPATITIS C ANTIBODY: Hep C Virus Ab: <0.1 s/co ratio (ref 0.0–0.9)

## 2020-05-16 LAB — HIV ANTIBODY (ROUTINE TESTING W REFLEX): HIV Screen 4th Generation wRfx: NONREACTIVE

## 2020-05-17 MED ORDER — VITAMIN D3 25 MCG (1000 UT) PO CAPS: 2.0000 | ORAL_CAPSULE | Freq: Every day | ORAL | 3 refills | Status: DC

## 2020-05-17 MED ORDER — AMLODIPINE BESYLATE 5 MG PO TABS: 5.0000 mg | ORAL_TABLET | Freq: Every day | ORAL | 3 refills | Status: DC

## 2020-05-17 MED ORDER — ALBUTEROL SULFATE HFA 108 (90 BASE) MCG/ACT IN AERS: INHALATION_SPRAY | RESPIRATORY_TRACT | 0 refills | Status: DC

## 2020-05-17 MED ORDER — QVAR REDIHALER 40 MCG/ACT IN AERB: 1.0000 | INHALATION_SPRAY | Freq: Two times a day (BID) | RESPIRATORY_TRACT | 5 refills | Status: DC

## 2020-05-17 NOTE — Addendum Note (Signed)
Addended by: Carlena Hurl on: 05/17/2020 04:45 PM   Modules accepted: Orders

## 2020-05-17 NOTE — Patient Instructions (Addendum)
Today you had a preventative care visit or wellness visit.    Topics today may have included healthy lifestyle, diet, exercise, preventative care, vaccinations, sick and well care, proper use of emergency dept and after hours care, as well as other concerns.     Recommendations: Continue to return yearly for your annual wellness and preventative care visits.  This gives [place] a chance to discuss healthy lifestyle, exercise, vaccinations, review your chart record, and perform screenings where appropriate.  I recommend you see your eye doctor yearly for routine vision care.  I recommend you see your dentist yearly for routine dental care including hygiene visits twice yearly.  See your specialist as usual    Vaccination recommendations were reviewed You are up to date on tetanus booster You are up to date on covid vaccine You are up to date on pneumonia vaccines You are up to date on shingles vaccine I recommend a yearly flu shot   Screening for cancer: Breast cancer screening: You should perform a self breast exam monthly.   We reviewed recommendations for regular mammograms and breast cancer screening.  Get your mammogram yearly  Colon cancer screening:  I reviewed your colonoscopy on file that is up to date from 2017  Cervical cancer screening: You are status post hysterectomy   Skin cancer screening: Check your skin regularly for new changes, growing lesions, or other lesions of concern Come in for evaluation if you have skin lesions of concern.  Lung cancer screening: If you have a greater than 30 pack year history of tobacco use, then you qualify for lung cancer screening with a chest CT scan  We currently don't have screenings for other cancers besides breast, cervical, colon, and lung cancers.  If you have a strong family history of cancer or have other cancer screening concerns, please let me know.    Bone health: Get at least 150 minutes of aerobic exercise  weekly Get weight bearing exercise at least once weekly Schedule bone density test  Please call to schedule your bone density test.   [facility] [address] [place], [place] [address]     Heart health: Get at least 150 minutes of aerobic exercise weekly Limit alcohol It is important to maintain a healthy blood pressure and healthy cholesterol numbers   Separate significant issues discussed: Hypertension-continue current medication  History of breast cancer-continue routine mammogram  History of ulcerative colitis-managed by gastroenterology  Vitamin D deficiency-continue supplement  Alkaline phosphatase elevated-discussed possible causes.  I suspect hers is due to both vitamin D deficiency and colitis, reviewed prior labs in the chart record and other specialist notes from gastroenterology  Asthma-continue albuterol as needed.  Discussed if worsening asthma symptoms and frequency may need to add back preventative inhaler  Chronic headaches-etiology unclear.  Consider neurology consult  Elevated PTH and calcium-follow-up with endocrinology as planned for new patient appointment soon

## 2020-05-20 ENCOUNTER — Encounter: Payer: Self-pay | Admitting: Medical

## 2020-05-20 NOTE — Addendum Note (Signed)
Addended by: Carlena Hurl on: 05/20/2020 03:03 PM   Modules accepted: Orders

## 2020-05-24 ENCOUNTER — Other Ambulatory Visit: Payer: Self-pay | Admitting: Medical

## 2020-05-24 DIAGNOSIS — Z9889 Other specified postprocedural states: Secondary | ICD-10-CM

## 2020-05-28 ENCOUNTER — Ambulatory Visit: Payer: POS | Admitting: Endocrinology

## 2020-05-28 ENCOUNTER — Other Ambulatory Visit: Payer: Self-pay

## 2020-05-28 ENCOUNTER — Encounter: Payer: Self-pay | Admitting: Endocrinology

## 2020-05-28 DIAGNOSIS — E210 Primary hyperparathyroidism: Secondary | ICD-10-CM

## 2020-05-28 LAB — MAGNESIUM: Magnesium: 2.1 mg/dL (ref 1.5–2.5)

## 2020-05-28 LAB — PHOSPHORUS: Phosphorus: 3.6 mg/dL (ref 2.3–4.6)

## 2020-05-28 NOTE — Patient Instructions (Signed)
Please call [phone] to schedule a Bone Density (DexaScan) a the [place] office at [place]. Blood tests are requested for you today.  We'll let you know about the results.  Please come back for a follow-up appointment in 6 months.

## 2020-05-28 NOTE — Progress Notes (Signed)
Subjective:    Patient ID: Katie Robinson, female    DOB: 22-Jul-1955, 64 y.o.   MRN: [id number]  HPI Pt is referred by Chana Bode, PA, for hypercalcemia.  Pt was noted to have hypercalcemia in 2018.  she has never had osteoporosis, urolithiasis, thyroid probs, parathyroid probs, sarcoidosis, PUD, pancreatitis, recent severe injury, or bony fracture.  she does not take vitamin-A supplement.  Pt denies taking antacids, Li++, or HCTZ.  She takes Vit-D, 2000 units/d.  She has chronic weight gain. Past Medical History:  Diagnosis Date  . Allergy    SEASONAL  . Anemia   . Arthralgia   . Asthma    controlled  . Breast cancer, left breast ([place]) 05/2017   left breast cancer  . Chronic headache    many years  . Family history of premature coronary artery disease    father  . Former smoker    10 pack year history  . GERD (gastroesophageal reflux disease)    mild, occasional  . H/O bone density study 11/2015   normal  . H/O cardiovascular stress test 2009  . History of radiation therapy 08/12/17- 09/08/17   Left Breast 40.05 Gy in 15 fractions, Left Breast Boost/ 10 Gy in 5 fractions.   Marland Kitchen Hx of adenomatous and sessile serrated colonic polyps 05/24/2014  . Iron deficiency 11/29/2015  . Obesity   . Pectus excavatum    mild, left asymmetric  . Personal history of radiation therapy   . S/P hysterectomy    due to fibroids  . Seasonal allergic rhinitis   . UC (ulcerative colitis) ([place]) 2015   Dr. Carlean Purl  . Urinary tract infection 2013  . UTI due to extended-spectrum beta lactamase (ESBL) producing Escherichia coli 09/20/2017  . Vitamin D deficiency 07/18/2014  . Wears glasses     Past Surgical History:  Procedure Laterality Date  . BREAST LUMPECTOMY Left 06/2017  . BREAST LUMPECTOMY WITH RADIOACTIVE SEED AND SENTINEL LYMPH NODE BIOPSY Left 06/29/2017   Procedure: LEFT BREAST LUMPECTOMY WITH RADIOACTIVE SEED AND LEFT AXILLARY SENTINEL LYMPH NODE BIOPSY ERAS PATHWAY;  Surgeon:  Rolm Bookbinder, MD;  Location: [facility];  Service: General;  Laterality: Left;  . COLONOSCOPY  02/2016   polyps; Dr. Silvano Rusk  . EVACUATION BREAST HEMATOMA Left 07/11/2017   Procedure: EVACUATION HEMATOMA BREAST;  Surgeon: Leighton Ruff, MD;  Location: [facility];  Service: General;  Laterality: Left;  . FOOT TENDON SURGERY Left 2013  . NAVEL REMOVED     AS A CHILD  . OTHER SURGICAL HISTORY     biopsy for possible sarcoidosis  . POLYPECTOMY    . SKIN BIOPSY    . TONSILLECTOMY    . TOTAL ABDOMINAL HYSTERECTOMY  2002   total; Dr. Molli Posey  . WISDOM TOOTH EXTRACTION      Social History   Socioeconomic History  . Marital status: Divorced    Spouse name: Not on file  . Number of children: 2  . Years of education: Not on file  . Highest education level: Not on file  Occupational History  . Occupation: Scientist, research (medical)  Tobacco Use  . Smoking status: Former Smoker    Types: Cigarettes    Quit date: 06/15/1988    Years since quitting: 31.9  . Smokeless tobacco: Never Used  Vaping Use  . Vaping Use: Never used  Substance and Sexual Activity  . Alcohol use: Yes    Alcohol/week: 0.0 standard drinks    Comment: rarely  .  Drug use: No  . Sexual activity: Not on file  Other Topics Concern  . Not on file  Social History Narrative   Lives with her son, has 2 adult children, not married, no exercise, active with walking on the job at the [place] [employer].   04/2018   Social Determinants of Health   Financial Resource Strain: Not on file  Food Insecurity: Not on file  Transportation Needs: Not on file  Physical Activity: Not on file  Stress: Not on file  Social Connections: Not on file  Intimate Partner Violence: Not on file    Current Outpatient Medications on File Prior to Visit  Medication Sig Dispense Refill  . Adalimumab (HUMIRA) 40 MG/0.8ML PSKT Inject 0.8 mLs (40 mg total) into the skin every 14 (fourteen) days. 2 each 3  . albuterol (VENTOLIN  HFA) 108 (90 Base) MCG/ACT inhaler TAKE 2 PUFFS BY MOUTH EVERY 6 HOURS AS NEEDED FOR WHEEZE OR SHORTNESS OF BREATH 60 g 0  . amLODipine (NORVASC) 5 MG tablet Take 1 tablet (5 mg total) by mouth daily. 90 tablet 3  . anastrozole (ARIMIDEX) 1 MG tablet TAKE 1 TABLET DAILY 90 tablet 3  . beclomethasone (QVAR REDIHALER) 40 MCG/ACT inhaler Inhale 1 puff into the lungs 2 (two) times daily. 10.6 g 5  . Cholecalciferol (VITAMIN D3) 25 MCG (1000 UT) CAPS Take 2 capsules (2,000 Units total) by mouth daily. 180 capsule 3  . montelukast (SINGULAIR) 10 MG tablet montelukast 10 mg tablet  Take 1 tablet every day by oral route at bedtime.    . Ascorbic Acid (VITAMIN C) 100 MG tablet Take 100 mg by mouth daily.    . fluticasone (VERAMYST) 27.5 MCG/SPRAY nasal spray Place 2 sprays into the nose daily.     No current facility-administered medications on file prior to visit.    No Known Allergies  Family History  Problem Relation Age of Onset  . Asthma Father   . Heart disease Father 34       died of MI mid 11-Aug-2022  . Hypertension Sister   . Asthma Sister   . Hypertension Mother   . Aneurysm Mother        brain  . Alzheimer's disease Mother   . Heart disease Mother   . Cancer Neg Hx   . Stroke Neg Hx   . Diabetes Neg Hx   . Colon cancer Neg Hx   . Colon polyps Neg Hx   . Crohn's disease Neg Hx   . Ulcerative colitis Neg Hx   . Hypercalcemia Neg Hx     BP 138/84   Pulse 82   Ht 5' 11"  (1.803 m)   Wt 245 lb (111.1 kg)   SpO2 99%   BMI 34.17 kg/m     Review of Systems Denies diarrhea, polyuria, hematuria, depression, numbness, and back pain.      Objective:   Physical Exam VS: see vs page GEN: no distress HEAD: head: no deformity eyes: no periorbital swelling, no proptosis external nose and ears are normal NECK: supple, thyroid is not enlarged CHEST WALL: no deformity LUNGS: clear to auscultation CV: reg rate and rhythm, no murmur.  MUSCULOSKELETAL: gait is normal and  steady EXTEMITIES: no deformity.  no leg edema NEURO:  readily moves all 4's.  sensation is intact to touch on all 4's SKIN:  Normal texture and temperature.  No rash or suspicious lesion is visible.   NODES:  None palpable at the neck.  PSYCH:  alert, well-oriented.  Does not appear anxious nor depressed.    Lab Results  Component Value Date   PTH 105 (H) 04/16/2020   CALCIUM 10.6 (H) 04/16/2020   CAION 5.9 (H) 04/16/2020   PHOS 3.5 04/16/2020   DEXA was normal in 2019  I have reviewed outside records, and summarized:  Pt was noted to have elevated Ca++, and referred here.  UC was well-controlled, on Humira.   Lab Results  Component Value Date   PTH 105 (H) 04/16/2020   CALCIUM 10.6 (H) 04/16/2020   CAION 5.9 (H) 04/16/2020   PHOS 3.6 05/28/2020   25-OH vit-D=40 1,25-OH Vit-D=83    Assessment & Plan:  Hyperparathyroidism, new to me, uncontrolled.  No current indication for surgery.  Vit-D def: well-controlled.  Pt is advised to continue the same cholecalciferol.  elev 1,25-OH Vit: prob due to high PTH.  Could be due to UC.  Check ACE next time.  Patient Instructions  Please call [phone] to schedule a Bone Density (DexaScan) a the [place] office at [place]. Blood tests are requested for you today.  We'll let you know about the results.  Please come back for a follow-up appointment in 6 months.

## 2020-06-05 ENCOUNTER — Other Ambulatory Visit: Payer: Self-pay | Admitting: Medical

## 2020-06-05 DIAGNOSIS — I10 Essential (primary) hypertension: Secondary | ICD-10-CM

## 2020-06-11 LAB — PROTEIN ELECTROPHORESIS, SERUM
Albumin ELP: 4.2 g/dL (ref 3.8–4.8)
Alpha 1: 0.4 g/dL — ABNORMAL HIGH (ref 0.2–0.3)
Alpha 2: 0.8 g/dL (ref 0.5–0.9)
Beta 2: 0.4 g/dL (ref 0.2–0.5)
Beta Globulin: 0.6 g/dL (ref 0.4–0.6)
Gamma Globulin: 1.2 g/dL (ref 0.8–1.7)
Total Protein: 7.6 g/dL (ref 6.1–8.1)

## 2020-06-11 LAB — PTH-RELATED PEPTIDE: PTH-Related Protein (PTH-RP): 11 pg/mL (ref 11–20)

## 2020-06-11 LAB — VITAMIN D 1,25 DIHYDROXY
Vitamin D 1, 25 (OH)2 Total: 86 pg/mL — ABNORMAL HIGH (ref 18–72)
Vitamin D2 1, 25 (OH)2: <8 pg/mL
Vitamin D3 1, 25 (OH)2: 86 pg/mL

## 2020-06-11 LAB — VITAMIN A: Vitamin A (Retinoic Acid): 56 ug/dL (ref 38–98)

## 2020-06-15 DIAGNOSIS — U071 COVID-19: Secondary | ICD-10-CM | POA: Insufficient documentation

## 2020-06-15 HISTORY — DX: COVID-19: U07.1

## 2020-06-20 ENCOUNTER — Ambulatory Visit
Admission: RE | Admit: 2020-06-20 | Discharge: 2020-06-20 | Disposition: A | Discharge status: Home / Self Care | Payer: POS | Source: Ambulatory Visit | Attending: Medical | Admitting: Medical

## 2020-06-20 ENCOUNTER — Other Ambulatory Visit: Payer: Self-pay

## 2020-06-20 DIAGNOSIS — Z0000 Encounter for general adult medical examination without abnormal findings: Secondary | ICD-10-CM

## 2020-06-20 DIAGNOSIS — Z780 Asymptomatic menopausal state: Secondary | ICD-10-CM

## 2020-06-20 DIAGNOSIS — E2839 Other primary ovarian failure: Secondary | ICD-10-CM

## 2020-06-24 ENCOUNTER — Telehealth: Payer: Self-pay | Admitting: Endocrinology

## 2020-06-24 NOTE — Telephone Encounter (Signed)
LM for pt to return call. See result note

## 2020-06-24 NOTE — Telephone Encounter (Signed)
Patient calling to check on her Bone Density results - she said she was returning our call, but I don't see where anyone from here had called her with the results. Please advise. [phone]

## 2020-06-26 NOTE — Telephone Encounter (Signed)
Pt has been advised of her DEXA results per imaging tab.

## 2020-07-04 ENCOUNTER — Other Ambulatory Visit: Payer: POS

## 2020-07-04 DIAGNOSIS — Z20822 Contact with and (suspected) exposure to covid-19: Secondary | ICD-10-CM

## 2020-07-05 LAB — SARS-COV-2, NAA 2 DAY TAT

## 2020-07-05 LAB — NOVEL CORONAVIRUS, NAA: SARS-CoV-2, NAA: DETECTED — AB

## 2020-07-07 ENCOUNTER — Telehealth: Payer: Self-pay | Admitting: Nurse Practitioner

## 2020-07-07 ENCOUNTER — Encounter: Payer: Self-pay | Admitting: Nurse Practitioner

## 2020-07-07 DIAGNOSIS — J45909 Unspecified asthma, uncomplicated: Secondary | ICD-10-CM | POA: Insufficient documentation

## 2020-07-07 DIAGNOSIS — E669 Obesity, unspecified: Secondary | ICD-10-CM | POA: Insufficient documentation

## 2020-07-07 NOTE — Telephone Encounter (Signed)
I called Katie Robinson to discuss Covid symptoms and the use of Sotrovimab, a monoclonal antibody infusion for those with mild to moderate Covid symptoms and at a high risk of hospitalization.    Pt does not qualify for infusion therapy as pt's symptoms first presented > 7 days prior to timing of infusion. Symptoms tier reviewed as well as criteria for ending isolation. Preventative practices reviewed. Patient verbalized understanding    Murray Hodgkins, NP

## 2020-07-12 ENCOUNTER — Telehealth: Payer: Self-pay | Admitting: Oncology

## 2020-07-12 NOTE — Telephone Encounter (Signed)
Rescheduled upcoming appointment per 1/27 schedule message due to patient testing positive for COVID. Patient is aware of changes.

## 2020-07-15 ENCOUNTER — Inpatient Hospital Stay: Payer: POS | Admitting: Oncology

## 2020-07-15 ENCOUNTER — Inpatient Hospital Stay: Payer: POS

## 2020-08-07 ENCOUNTER — Other Ambulatory Visit: Payer: Self-pay | Admitting: Medical

## 2020-08-07 ENCOUNTER — Ambulatory Visit
Admission: RE | Admit: 2020-08-07 | Discharge: 2020-08-07 | Disposition: A | Discharge status: Home / Self Care | Payer: POS | Source: Ambulatory Visit | Attending: Medical | Admitting: Medical

## 2020-08-07 ENCOUNTER — Other Ambulatory Visit: Payer: Self-pay

## 2020-08-07 DIAGNOSIS — Z9889 Other specified postprocedural states: Secondary | ICD-10-CM

## 2020-08-07 DIAGNOSIS — N6320 Unspecified lump in the left breast, unspecified quadrant: Secondary | ICD-10-CM

## 2020-08-07 DIAGNOSIS — R2232 Localized swelling, mass and lump, left upper limb: Secondary | ICD-10-CM

## 2020-08-09 ENCOUNTER — Other Ambulatory Visit: Payer: Self-pay | Admitting: *Deleted

## 2020-08-09 DIAGNOSIS — D0512 Intraductal carcinoma in situ of left breast: Secondary | ICD-10-CM

## 2020-08-12 NOTE — Progress Notes (Signed)
[place]  [phone]) [phone] Fax:([phone]) [phone]     ID: Katie Robinson [facility] DOB: Jun 18, 1955  MR#: [id number]  RNH#:[id number]  Patient Care Team: Caryl Ada as PCP - General (Family Medicine) Alcides Nutting, Virgie Dad, MD as Consulting Physician (Oncology) Gatha Mayer, MD as Consulting Physician (Gastroenterology) Eppie Gibson, MD as Attending Physician (Radiation Oncology) Rolm Bookbinder, MD as Consulting Physician (General Surgery) Delice Bison, Charlestine Massed, NP as Nurse Practitioner (Hematology and Oncology) OTHER MD:  CHIEF COMPLAINT: DCIS estrogen receptor positive  CURRENT TREATMENT: Anastrozole   INTERVAL HISTORY: Katie Robinson returns today for follow-up of her noninvasive breast cancer.  The patient continues on anastrozole.  She has mild hot flashes which she does not believe require any intervention.  She feels more hot than cold she says.  She has no arthralgias or myalgias related to this although of course she does have arthritis and is on adalimumab for that vaginal dryness is not an issue.  Since her last visit, she presented for annual mammography with a palpable left breast abnormality. She underwent bilateral diagnostic mammography with tomography and left axilla ultrasonography at [facility] on 08/07/2020 showing: breast density category C; no evidence of malignancy in either breast.   She also underwent bone density screening on 06/20/2020 showing a T-score of -0.6, which is considered normal.  Of note, she also underwent abdomen ultrasound on 04/16/2020 showing no abnormalities.  Specifically fatty liver was not noted   REVIEW OF SYSTEMS: Katie Robinson continues to work third shift, which she has done for many years.  She is just beginning to think about retiring but does not know what she would do with her free time.  She had minimal symptoms with her Covid infection earlier this year.  GM   COVID 19 VACCINATION STATUS: fully  vaccinated [employer]), with booster 03/2020; also had COVID-19 infection January 2022.     HISTORY OF CURRENT ILLNESS: From the original intake note:  Katie Robinson had routine screening mammography on 06/07/2017 showing a possible abnormality in the left breast. She underwent unilateral left breast diagnostic mammography with tomography and left axilla ultrasonography on 06/17/2017 at [place] on 06/10/2017 showing: Suspicious, grouped left breast calcifications. Negative for left axillary lymphadenopathy.  Accordingly on 06/14/2017 she proceeded to biopsy of the left breast area in question. 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 Focus Highly suspicious for stromal invasion. Prognostic indicators significant for: estrogen receptor, 50% positive with moderate staining intensity and progesterone receptor, 0% negative.  The patient's subsequent history is as detailed below.   PAST MEDICAL HISTORY: Past Medical History:  Diagnosis Date  . Allergy    SEASONAL  . Anemia   . Arthralgia   . Asthma    controlled  . Breast cancer, left breast ([place]) 05/2017   left breast cancer  . Chronic headache    many years  . COVID-19 virus infection 06/2020  . Family history of premature coronary artery disease    father  . Former smoker    10 pack year history  . GERD (gastroesophageal reflux disease)    mild, occasional  . H/O bone density study 11/2015   normal  . H/O cardiovascular stress test 2009  . History of radiation therapy 08/12/17- 09/08/17   Left Breast 40.05 Gy in 15 fractions, Left Breast Boost/ 10 Gy in 5 fractions.   Marland Kitchen Hx of adenomatous and sessile serrated  colonic polyps 05/24/2014  . Iron deficiency 11/29/2015  . Obesity   . Pectus excavatum    mild, left asymmetric  . Personal history of radiation therapy   . S/P hysterectomy    due to fibroids  .  Seasonal allergic rhinitis   . UC (ulcerative colitis) ([place]) 08-28-13   Dr. Carlean Purl  . Urinary tract infection 08-29-11  . UTI due to extended-spectrum beta lactamase (ESBL) producing Escherichia coli 09/20/2017  . Vitamin D deficiency 07/18/2014  . Wears glasses     PAST SURGICAL HISTORY: Past Surgical History:  Procedure Laterality Date  . BREAST LUMPECTOMY Left 06/2017  . BREAST LUMPECTOMY WITH RADIOACTIVE SEED AND SENTINEL LYMPH NODE BIOPSY Left 06/29/2017   Procedure: LEFT BREAST LUMPECTOMY WITH RADIOACTIVE SEED AND LEFT AXILLARY SENTINEL LYMPH NODE BIOPSY ERAS PATHWAY;  Surgeon: Rolm Bookbinder, MD;  Location: [place];  Service: General;  Laterality: Left;  . COLONOSCOPY  02/2016   polyps; Dr. Silvano Rusk  . EVACUATION BREAST HEMATOMA Left 07/11/2017   Procedure: EVACUATION HEMATOMA BREAST;  Surgeon: Leighton Ruff, MD;  Location: [facility];  Service: General;  Laterality: Left;  . FOOT TENDON SURGERY Left 2011-08-29  . NAVEL REMOVED     AS A CHILD  . OTHER SURGICAL HISTORY     biopsy for possible sarcoidosis  . POLYPECTOMY    . SKIN BIOPSY    . TONSILLECTOMY    . TOTAL ABDOMINAL HYSTERECTOMY  August 28, 2000   total; Dr. Molli Posey  . WISDOM TOOTH EXTRACTION      FAMILY HISTORY Family History  Problem Relation Age of Onset  . Asthma Father   . Heart disease Father 25       died of MI mid 08/28/22  . Hypertension Sister   . Asthma Sister   . Hypertension Mother   . Aneurysm Mother        brain  . Alzheimer's disease Mother   . Heart disease Mother   . Cancer Neg Hx   . Stroke Neg Hx   . Diabetes Neg Hx   . Colon cancer Neg Hx   . Colon polyps Neg Hx   . Crohn's disease Neg Hx   . Ulcerative colitis Neg Hx   . Hypercalcemia Neg Hx   Mother passed at due to brain aneurysm and she also had Alzhimer's. Father passed at 53 due to heart attack and asthma. She has 1 living sister with good health. No history with breast or ovarian cancer in the family.   GYNECOLOGIC  HISTORY:  No LMP recorded. Patient has had a hysterectomy. Menarche: 65 years old Age at first live birth: 65 years old GP: GxP2 HRT: Did not use hormone replacement She has a total hysterectomy with bilateral salpingo-oophorectomy in 28-Aug-2000 due to fibroids and a chocolate cyst on her ovaries.   SOCIAL HISTORY:  She works as a Scientist, research (medical). She is divorced. Her son lives with her and is unmarried. He works at [employer] as a Technical brewer. Her daughter is a Chiropractor at [employer].  The patient has 1 grandchild. She belongs to [employer].    ADVANCED DIRECTIVES: Not in place; at the 06/23/2017 visit the patient was given the appropriate documents to complete on notarized at her discretion   HEALTH MAINTENANCE: Social History   Tobacco Use  . Smoking status: Former Smoker    Types: Cigarettes    Quit date: 06/15/1988    Years since quitting: 32.1  . Smokeless tobacco: Never  Used  Vaping Use  . Vaping Use: Never used  Substance Use Topics  . Alcohol use: Yes    Alcohol/week: 0.0 standard drinks    Comment: rarely  . Drug use: No     Colonoscopy: 11/25/2017/ [facility]/ normal  PAP: Status post Hysterectomy/ BSO 2002  Bone density: 12/02/2015 T-score +0.2 normal   No Known Allergies  Current Outpatient Medications  Medication Sig Dispense Refill  . Adalimumab (HUMIRA) 40 MG/0.8ML PSKT Inject 0.8 mLs (40 mg total) into the skin every 14 (fourteen) days. 2 each 3  . albuterol (VENTOLIN HFA) 108 (90 Base) MCG/ACT inhaler TAKE 2 PUFFS BY MOUTH EVERY 6 HOURS AS NEEDED FOR WHEEZE OR SHORTNESS OF BREATH 60 g 0  . amLODipine (NORVASC) 5 MG tablet Take 1 tablet (5 mg total) by mouth daily. 90 tablet 3  . anastrozole (ARIMIDEX) 1 MG tablet TAKE 1 TABLET DAILY 90 tablet 3  . Ascorbic Acid (VITAMIN C) 100 MG tablet Take 100 mg by mouth daily.    . beclomethasone (QVAR REDIHALER) 40 MCG/ACT inhaler Inhale 1 puff into the lungs 2 (two) times daily. 10.6 g  5  . Cholecalciferol (VITAMIN D3) 25 MCG (1000 UT) CAPS Take 2 capsules (2,000 Units total) by mouth daily. 180 capsule 3  . fluticasone (VERAMYST) 27.5 MCG/SPRAY nasal spray Place 2 sprays into the nose daily.    . montelukast (SINGULAIR) 10 MG tablet montelukast 10 mg tablet  Take 1 tablet every day by oral route at bedtime.     No current facility-administered medications for this visit.    OBJECTIVE: Middle-aged African-American woman in no acute distress  Vitals:   08/13/20 1130  BP: 138/74  Pulse: 77  Resp: 18  Temp: 97.7 F (36.5 C)  SpO2: 98%     Body mass index is 33.42 kg/m.   Wt Readings from Last 3 Encounters:  08/13/20 239 lb 9.6 oz (108.7 kg)  05/28/20 245 lb (111.1 kg)  05/15/20 244 lb 3.2 oz (110.8 kg)      ECOG FS:1 - Symptomatic but completely ambulatory  Sclerae unicteric, EOMs intact Wearing a mask No cervical or supraclavicular adenopathy Lungs no rales or rhonchi Heart regular rate and rhythm Abd soft, nontender, positive bowel sounds MSK no focal spinal tenderness, no upper extremity lymphedema Neuro: nonfocal, well oriented, appropriate affect Breasts: The right breast is benign per the left breast is status post lumpectomy and radiation.  In the left axilla there is an irregular area which does not feel like a lymph node and is consistent with scar tissue.  It is just superior and lateral to the incision.   LAB RESULTS:  CMP     Component Value Date/Time   NA 143 08/13/2020 1111   NA 141 05/11/2018 1445   K 3.9 08/13/2020 1111   CL 109 08/13/2020 1111   CO2 23 08/13/2020 1111   GLUCOSE 89 08/13/2020 1111   BUN 12 08/13/2020 1111   BUN 8 05/11/2018 1445   CREATININE 0.71 08/13/2020 1111   CREATININE 0.59 05/10/2017 0942   CALCIUM 10.3 08/13/2020 1111   PROT 7.1 08/13/2020 1111   PROT 6.9 05/18/2018 1430   ALBUMIN 3.7 08/13/2020 1111   ALBUMIN 4.3 05/11/2018 1445   AST 13 (L) 08/13/2020 1111   ALT 12 08/13/2020 1111   ALKPHOS 150  (H) 08/13/2020 1111   BILITOT 0.3 08/13/2020 1111   GFRNONAA >60 08/13/2020 1111   GFRAA 105 05/11/2018 1445    Lab Results  Component Value Date  ALBUMINELP 4.2 05/28/2020   A1GS 0.4 (H) 05/28/2020   A2GS 0.8 05/28/2020   BETS 0.6 05/28/2020   BETA2SER 0.4 05/28/2020   GAMS 1.2 05/28/2020   MSPIKE Not Observed 05/18/2018   SPEI  05/28/2020     Comment:     . Alpha-1 globulin increase noted. .     No results found for: Nils Pyle, [facility]  Lab Results  Component Value Date   WBC 10.0 08/13/2020   NEUTROABS 4.2 08/13/2020   HGB 13.1 08/13/2020   HCT 40.8 08/13/2020   MCV 86.3 08/13/2020   PLT 289 08/13/2020   No results found for: LABCA2  No components found for: ZRAQTM226  No results for input(s): INR in the last 168 hours.  No results found for: LABCA2  No results found for: JFH545  No results found for: GYB638  No results found for: LHT342  No results found for: CA2729  No components found for: HGQUANT  No results found for: CEA1 / No results found for: CEA1   No results found for: AFPTUMOR  No results found for: CHROMOGRNA  No results found for: HGBA, HGBA2QUANT, HGBFQUANT, HGBSQUAN (Hemoglobinopathy evaluation)   No results found for: LDH  Lab Results  Component Value Date   IRON 45 04/05/2016   TIBC 141 (L) 04/05/2016   IRONPCTSAT 32 (H) 04/05/2016   (Iron and TIBC)  Lab Results  Component Value Date   FERRITIN 68 04/05/2016    Urinalysis    Component Value Date/Time   COLORURINE YELLOW 09/16/2017 1220   APPEARANCEUR CLEAR 09/16/2017 1220   LABSPEC 1.010 05/11/2018 1402   PHURINE 6.5 09/16/2017 1220   GLUCOSEU NEGATIVE 09/16/2017 1220   HGBUR TRACE-INTACT (A) 09/16/2017 1220   BILIRUBINUR negative 05/11/2018 1402   BILIRUBINUR n 04/29/2015 1218   KETONESUR negative 05/11/2018 1402   KETONESUR NEGATIVE 09/16/2017 1220   PROTEINUR negative 05/11/2018 1402   PROTEINUR NEGATIVE 04/04/2016 1552    UROBILINOGEN 0.2 09/16/2017 1220   NITRITE Negative 05/11/2018 1402   NITRITE NEGATIVE 09/16/2017 1220   LEUKOCYTESUR Negative 05/11/2018 1402    STUDIES: MM DIAG BREAST TOMO BILATERAL  Result Date: 08/07/2020 CLINICAL DATA:  History of left breast cancer status post lumpectomy and radiation therapy in 2019. Patient complains of a palpable abnormality in the left breast. EXAM: DIGITAL DIAGNOSTIC BILATERAL MAMMOGRAM WITH TOMOSYNTHESIS AND CAD; [place] AXILLARY LEFT TECHNIQUE: Bilateral digital diagnostic mammography and breast tomosynthesis was performed. The images were evaluated with computer-aided detection.; Targeted ultrasound examination of the left axilla was performed. COMPARISON:  Previous exam(s). ACR Breast Density Category c: The breast tissue is heterogeneously dense, which may obscure small masses. FINDINGS: No suspicious mass, malignant type microcalcifications or distortion detected in either breast. There are stable lumpectomy changes in the left breast. On physical exam, I do not palpate a discrete mass in the area of clinical concern in the left axilla. Targeted ultrasound is performed, showing normal lymph nodes in the left axilla. No suspicious mass identified. IMPRESSION: No evidence of malignancy in either breast. RECOMMENDATION: Bilateral screening mammogram in 1 year is recommended. I have discussed the findings and recommendations with the patient. If applicable, a reminder letter will be sent to the patient regarding the next appointment. BI-RADS CATEGORY  2: Benign. Electronically Signed   By: Lillia Mountain M.D.   On: 08/07/2020 12:44   [place] AXILLA LEFT  Result Date: 08/07/2020 CLINICAL DATA:  History of left breast cancer status post lumpectomy and radiation therapy in 2019. Patient complains of a  palpable abnormality in the left breast. EXAM: DIGITAL DIAGNOSTIC BILATERAL MAMMOGRAM WITH TOMOSYNTHESIS AND CAD; [place] AXILLARY LEFT TECHNIQUE: Bilateral digital diagnostic mammography and  breast tomosynthesis was performed. The images were evaluated with computer-aided detection.; Targeted ultrasound examination of the left axilla was performed. COMPARISON:  Previous exam(s). ACR Breast Density Category c: The breast tissue is heterogeneously dense, which may obscure small masses. FINDINGS: No suspicious mass, malignant type microcalcifications or distortion detected in either breast. There are stable lumpectomy changes in the left breast. On physical exam, I do not palpate a discrete mass in the area of clinical concern in the left axilla. Targeted ultrasound is performed, showing normal lymph nodes in the left axilla. No suspicious mass identified. IMPRESSION: No evidence of malignancy in either breast. RECOMMENDATION: Bilateral screening mammogram in 1 year is recommended. I have discussed the findings and recommendations with the patient. If applicable, a reminder letter will be sent to the patient regarding the next appointment. BI-RADS CATEGORY  2: Benign. Electronically Signed   By: Lillia Mountain M.D.   On: 08/07/2020 12:44    ELIGIBLE FOR AVAILABLE RESEARCH PROTOCOL: No   ASSESSMENT: 66 y.o. [place] woman status post left breast lower inner quadrant biopsy 06/14/2017 for ductal carcinoma in situ, grade 3, with an area suspicious for microinvasion, estrogen receptor positive progesterone receptor negative  (1) status post left lumpectomy and sentinel lymph node sampling 06/29/2017 showing no residual carcinoma, with clear margins  (a) a total of 5 sentinel lymph nodes were removed  (2) adjuvant radiation 08/12/17 - 09/08/17 Site/dose:    1) Left Breast / 40.05 Gy in 15 fractions 2) Left Breast Boost / 10 Gy in 5 fractions  (3) anastrozole started April 2019  (a) bone density 12/27/2017 normal with a T score of -0.9  (b) bone density 06/20/2020 shows a T score of -0.6.   PLAN: Katie Robinson is just a little over 3 years out from definitive surgery for her breast cancer with no  evidence of disease recurrence.  This is very favorable.  I reassured her that the irregular mass in her left axilla is scar tissue and showed her how to memorize what it feels like so she can tell us if any changes occur.  The fact that she had a recent ultrasound showing no significant finding there is very reassuring.  The plan is to continue anastrozole for a total of 5years.  She knows to call for any other issue that may develop before the next visit  Total encounter time 25 minutes.* Izamar Linden, Virgie Dad, MD  08/13/20 11:53 AM Medical Oncology and Hematology [facility] [place], [place] [address] Tel. [phone]    Fax. [phone]   I, Wilburn Mylar, am acting as scribe for Dr. Virgie Dad. Jera Headings.  I, Lurline Del MD, have reviewed the above documentation for accuracy and completeness, and I agree with the above.   *Total Encounter Time as defined by the Centers for Medicare and Medicaid Services includes, in addition to the face-to-face time of a patient visit (documented in the note above) non-face-to-face time: obtaining and reviewing outside history, ordering and reviewing medications, tests or procedures, care coordination (communications with other health care professionals or caregivers) and documentation in the medical record.

## 2020-08-13 ENCOUNTER — Inpatient Hospital Stay (HOSPITAL_BASED_OUTPATIENT_CLINIC_OR_DEPARTMENT_OTHER): Payer: POS | Admitting: Oncology

## 2020-08-13 ENCOUNTER — Inpatient Hospital Stay: Payer: POS | Attending: Oncology

## 2020-08-13 ENCOUNTER — Other Ambulatory Visit: Payer: Self-pay

## 2020-08-13 VITALS — BP 138/74 | HR 77 | Temp 97.7°F | Resp 18 | Ht 71.0 in | Wt 239.6 lb

## 2020-08-13 DIAGNOSIS — Z818 Family history of other mental and behavioral disorders: Secondary | ICD-10-CM | POA: Insufficient documentation

## 2020-08-13 DIAGNOSIS — Z836 Family history of other diseases of the respiratory system: Secondary | ICD-10-CM | POA: Diagnosis not present

## 2020-08-13 DIAGNOSIS — Z8719 Personal history of other diseases of the digestive system: Secondary | ICD-10-CM | POA: Diagnosis not present

## 2020-08-13 DIAGNOSIS — Z87440 Personal history of urinary (tract) infections: Secondary | ICD-10-CM | POA: Diagnosis not present

## 2020-08-13 DIAGNOSIS — J45909 Unspecified asthma, uncomplicated: Secondary | ICD-10-CM | POA: Insufficient documentation

## 2020-08-13 DIAGNOSIS — R232 Flushing: Secondary | ICD-10-CM | POA: Insufficient documentation

## 2020-08-13 DIAGNOSIS — D0512 Intraductal carcinoma in situ of left breast: Secondary | ICD-10-CM | POA: Insufficient documentation

## 2020-08-13 DIAGNOSIS — Z6833 Body mass index (BMI) 33.0-33.9, adult: Secondary | ICD-10-CM | POA: Insufficient documentation

## 2020-08-13 DIAGNOSIS — Z79899 Other long term (current) drug therapy: Secondary | ICD-10-CM | POA: Diagnosis not present

## 2020-08-13 DIAGNOSIS — Z87891 Personal history of nicotine dependence: Secondary | ICD-10-CM | POA: Insufficient documentation

## 2020-08-13 DIAGNOSIS — Z79811 Long term (current) use of aromatase inhibitors: Secondary | ICD-10-CM | POA: Diagnosis not present

## 2020-08-13 DIAGNOSIS — Z86010 Personal history of colonic polyps: Secondary | ICD-10-CM | POA: Insufficient documentation

## 2020-08-13 DIAGNOSIS — Z7951 Long term (current) use of inhaled steroids: Secondary | ICD-10-CM | POA: Diagnosis not present

## 2020-08-13 DIAGNOSIS — Z923 Personal history of irradiation: Secondary | ICD-10-CM | POA: Insufficient documentation

## 2020-08-13 DIAGNOSIS — Z90722 Acquired absence of ovaries, bilateral: Secondary | ICD-10-CM | POA: Diagnosis not present

## 2020-08-13 DIAGNOSIS — Z8616 Personal history of COVID-19: Secondary | ICD-10-CM | POA: Insufficient documentation

## 2020-08-13 DIAGNOSIS — Z170 Estrogen receptor positive status [ER+]: Secondary | ICD-10-CM | POA: Diagnosis not present

## 2020-08-13 DIAGNOSIS — Z8249 Family history of ischemic heart disease and other diseases of the circulatory system: Secondary | ICD-10-CM | POA: Diagnosis not present

## 2020-08-13 DIAGNOSIS — C50312 Malignant neoplasm of lower-inner quadrant of left female breast: Secondary | ICD-10-CM

## 2020-08-13 LAB — CBC WITH DIFFERENTIAL (CANCER CENTER ONLY)
Abs Immature Granulocytes: 0.01 10*3/uL (ref 0.00–0.07)
Basophils Absolute: 0 10*3/uL (ref 0.0–0.1)
Basophils Relative: 0 %
Eosinophils Absolute: 0.3 10*3/uL (ref 0.0–0.5)
Eosinophils Relative: 3 %
HCT: 40.8 % (ref 36.0–46.0)
Hemoglobin: 13.1 g/dL (ref 12.0–15.0)
Immature Granulocytes: 0 %
Lymphocytes Relative: 46 %
Lymphs Abs: 4.6 10*3/uL — ABNORMAL HIGH (ref 0.7–4.0)
MCH: 27.7 pg (ref 26.0–34.0)
MCHC: 32.1 g/dL (ref 30.0–36.0)
MCV: 86.3 fL (ref 80.0–100.0)
Monocytes Absolute: 0.8 10*3/uL (ref 0.1–1.0)
Monocytes Relative: 8 %
Neutro Abs: 4.2 10*3/uL (ref 1.7–7.7)
Neutrophils Relative %: 43 %
Platelet Count: 289 10*3/uL (ref 150–400)
RBC: 4.73 MIL/uL (ref 3.87–5.11)
RDW: 13.6 % (ref 11.5–15.5)
WBC Count: 10 10*3/uL (ref 4.0–10.5)
nRBC: 0 % (ref 0.0–0.2)

## 2020-08-13 LAB — CMP (CANCER CENTER ONLY)
ALT: 12 U/L (ref 0–44)
AST: 13 U/L — ABNORMAL LOW (ref 15–41)
Albumin: 3.7 g/dL (ref 3.5–5.0)
Alkaline Phosphatase: 150 U/L — ABNORMAL HIGH (ref 38–126)
Anion gap: 11 (ref 5–15)
BUN: 12 mg/dL (ref 8–23)
CO2: 23 mmol/L (ref 22–32)
Calcium: 10.3 mg/dL (ref 8.9–10.3)
Chloride: 109 mmol/L (ref 98–111)
Creatinine: 0.71 mg/dL (ref 0.44–1.00)
GFR, Estimated: >60 mL/min (ref >60)
Glucose, Bld: 89 mg/dL (ref 70–99)
Potassium: 3.9 mmol/L (ref 3.5–5.1)
Sodium: 143 mmol/L (ref 135–145)
Total Bilirubin: 0.3 mg/dL (ref 0.3–1.2)
Total Protein: 7.1 g/dL (ref 6.5–8.1)

## 2020-08-13 MED ORDER — ANASTROZOLE 1 MG PO TABS
1.0000 mg | ORAL_TABLET | Freq: Every day | ORAL | 4 refills | Status: DC
Start: 2020-08-13 — End: 2022-05-26

## 2020-08-14 ENCOUNTER — Telehealth: Payer: Self-pay | Admitting: Oncology

## 2020-08-14 NOTE — Telephone Encounter (Signed)
Scheduled appts per 3/1 los. Unable to leave voicemail. Mailed appt reminder and calendar.

## 2020-10-30 ENCOUNTER — Telehealth: Payer: Self-pay | Admitting: Internal Medicine

## 2020-10-30 MED ORDER — HUMIRA (2 PEN) 40 MG/0.4ML ~~LOC~~ AJKT: 40.0000 mg | AUTO-INJECTOR | SUBCUTANEOUS | 6 refills | Status: DC

## 2020-10-30 NOTE — Telephone Encounter (Signed)
New pen kit clarified with the pharmacist.

## 2020-11-05 NOTE — Telephone Encounter (Signed)
Patient needs prefilled syringe going forward. Patient does not want the pens

## 2020-11-05 NOTE — Addendum Note (Signed)
Addended by: Marlon Pel on: 11/05/2020 11:55 AM   Modules accepted: Orders

## 2020-11-05 NOTE — Telephone Encounter (Signed)
[facility] calling back again.  Wants to get clarification on dosage.  Can be reached at [phone].

## 2020-12-02 ENCOUNTER — Other Ambulatory Visit: Payer: Self-pay

## 2020-12-02 ENCOUNTER — Ambulatory Visit: Payer: POS | Admitting: Endocrinology

## 2020-12-02 DIAGNOSIS — E559 Vitamin D deficiency, unspecified: Secondary | ICD-10-CM

## 2020-12-02 DIAGNOSIS — E210 Primary hyperparathyroidism: Secondary | ICD-10-CM | POA: Diagnosis not present

## 2020-12-02 LAB — VITAMIN D 25 HYDROXY (VIT D DEFICIENCY, FRACTURES): VITD: 38.56 ng/mL (ref 30.00–100.00)

## 2020-12-02 NOTE — Patient Instructions (Addendum)
Blood tests are requested for you today.  We'll let you know about the results.  Please come back for a follow-up appointment in 6 months.

## 2020-12-02 NOTE — Progress Notes (Signed)
Subjective:    Patient ID: Katie Robinson, female    DOB: 02-Sep-1955, 65 y.o.   MRN: [id number]  HPI Pt returns for f/u of hypercalcemia (dx'ed 2018; she has elev PTH and low Vit-D; no h/o urolithiasis or bony fracture; she takes Vit-D, 2000 units/d; Dexa was normal in 2022; 1,25-OH Vit-D was high, but ACE was normal).  pt states she feels well in general.  She takes Vit-D as rx'ed.   Past Medical History:  Diagnosis Date   Allergy    SEASONAL   Anemia    Arthralgia    Asthma    controlled   Breast cancer, left breast ([place]) 05/2017   left breast cancer   Chronic headache    many years   COVID-19 virus infection 06/2020   Family history of premature coronary artery disease    father   Former smoker    10 pack year history   GERD (gastroesophageal reflux disease)    mild, occasional   H/O bone density study 11/2015   normal   H/O cardiovascular stress test 2009   History of radiation therapy 08/12/17- 09/08/17   Left Breast 40.05 Gy in 15 fractions, Left Breast Boost/ 10 Gy in 5 fractions.    Hx of adenomatous and sessile serrated colonic polyps 05/24/2014   Iron deficiency 11/29/2015   Obesity    Pectus excavatum    mild, left asymmetric   Personal history of radiation therapy    S/P hysterectomy    due to fibroids   Seasonal allergic rhinitis    UC (ulcerative colitis) ([place]) 2015   Dr. Carlean Purl   Urinary tract infection 2013   UTI due to extended-spectrum beta lactamase (ESBL) producing Escherichia coli 09/20/2017   Vitamin D deficiency 07/18/2014   Wears glasses     Past Surgical History:  Procedure Laterality Date   BREAST LUMPECTOMY Left 06/2017   BREAST LUMPECTOMY WITH RADIOACTIVE SEED AND SENTINEL LYMPH NODE BIOPSY Left 06/29/2017   Procedure: LEFT BREAST LUMPECTOMY WITH RADIOACTIVE SEED AND LEFT AXILLARY SENTINEL LYMPH NODE BIOPSY ERAS PATHWAY;  Surgeon: Rolm Bookbinder, MD;  Location: [facility];  Service: General;  Laterality: Left;    COLONOSCOPY  02/2016   polyps; Dr. Silvano Rusk   EVACUATION BREAST HEMATOMA Left 07/11/2017   Procedure: EVACUATION HEMATOMA BREAST;  Surgeon: Leighton Ruff, MD;  Location[facility];  Service: General;  Laterality: Left;   FOOT TENDON SURGERY Left 2013   NAVEL REMOVED     AS A CHILD   OTHER SURGICAL HISTORY     biopsy for possible sarcoidosis   POLYPECTOMY     SKIN BIOPSY     TONSILLECTOMY     TOTAL ABDOMINAL HYSTERECTOMY  2002   total; Dr. Molli Posey   WISDOM TOOTH EXTRACTION      Social History   Socioeconomic History   Marital status: Divorced    Spouse name: Not on file   Number of children: 2   Years of education: Not on file   Highest education level: Not on file  Occupational History   Occupation: postal clerk  Tobacco Use   Smoking status: Former    Pack years: 0.00    Types: Cigarettes    Quit date: 06/15/1988    Years since quitting: 32.4   Smokeless tobacco: Never  Vaping Use   Vaping Use: Never used  Substance and Sexual Activity   Alcohol use: Yes    Alcohol/week: 0.0 standard drinks    Comment: rarely  Drug use: No   Sexual activity: Not on file  Other Topics Concern   Not on file  Social History Narrative   Lives with her son, has 2 adult children, not married, no exercise, active with walking on the job at the [place] [employer].   04/2018   Social Determinants of Health   Financial Resource Strain: Not on file  Food Insecurity: Not on file  Transportation Needs: Not on file  Physical Activity: Not on file  Stress: Not on file  Social Connections: Not on file  Intimate Partner Violence: Not on file    Current Outpatient Medications on File Prior to Visit  Medication Sig Dispense Refill   albuterol (VENTOLIN HFA) 108 (90 Base) MCG/ACT inhaler TAKE 2 PUFFS BY MOUTH EVERY 6 HOURS AS NEEDED FOR WHEEZE OR SHORTNESS OF BREATH 60 g 0   amLODipine (NORVASC) 5 MG tablet Take 1 tablet (5 mg total) by mouth daily. 90 tablet 3   anastrozole  (ARIMIDEX) 1 MG tablet Take 1 tablet (1 mg total) by mouth daily. 90 tablet 4   Ascorbic Acid (VITAMIN C) 100 MG tablet Take 100 mg by mouth daily.     beclomethasone (QVAR REDIHALER) 40 MCG/ACT inhaler Inhale 1 puff into the lungs 2 (two) times daily. 10.6 g 5   Cholecalciferol (VITAMIN D3) 25 MCG (1000 UT) CAPS Take 2 capsules (2,000 Units total) by mouth daily. 180 capsule 3   fluticasone (VERAMYST) 27.5 MCG/SPRAY nasal spray Place 2 sprays into the nose daily.     montelukast (SINGULAIR) 10 MG tablet montelukast 10 mg tablet  Take 1 tablet every day by oral route at bedtime.     No current facility-administered medications on file prior to visit.    No Known Allergies  Family History  Problem Relation Age of Onset   Asthma Father    Heart disease Father 53       died of MI mid 08-10-2022   Hypertension Sister    Asthma Sister    Hypertension Mother    Aneurysm Mother        brain   Alzheimer's disease Mother    Heart disease Mother    Cancer Neg Hx    Stroke Neg Hx    Diabetes Neg Hx    Colon cancer Neg Hx    Colon polyps Neg Hx    Crohn's disease Neg Hx    Ulcerative colitis Neg Hx    Hypercalcemia Neg Hx     There were no vitals taken for this visit.  Review of Systems     Objective:   Physical Exam VS: no staff available to do this.   GENERAL: no distress.  Gait: normal and steady  25-OH Vit-D=41  Lab Results  Component Value Date   PTH 68 12/02/2020   CALCIUM 10.2 12/02/2020   CAION 5.9 (H) 04/16/2020   PHOS 3.6 05/28/2020        Assessment & Plan:  Vit-D def: well-controlled. Hyperparathyroidism: resolved with Vit-D supplementation.  Please continue the same Vit-D Hypercalcemia: resolved with normalized PTH.

## 2020-12-04 ENCOUNTER — Encounter: Payer: Self-pay | Admitting: Endocrinology

## 2020-12-04 LAB — PTH, INTACT AND CALCIUM
Calcium: 10.2 mg/dL (ref 8.6–10.4)
PTH: 68 pg/mL (ref 16–77)

## 2020-12-04 LAB — ANGIOTENSIN CONVERTING ENZYME: Angiotensin-Converting Enzyme: 32 U/L (ref 9–67)

## 2021-01-24 ENCOUNTER — Telehealth: Payer: Self-pay

## 2021-01-24 ENCOUNTER — Encounter: Payer: Self-pay | Admitting: Family Medicine

## 2021-01-24 ENCOUNTER — Telehealth: Payer: POS | Admitting: Family Medicine

## 2021-01-24 VITALS — Ht 72.0 in | Wt 239.0 lb

## 2021-01-24 DIAGNOSIS — U071 COVID-19: Secondary | ICD-10-CM

## 2021-01-24 DIAGNOSIS — K5150 Left sided colitis without complications: Secondary | ICD-10-CM

## 2021-01-24 DIAGNOSIS — J4540 Moderate persistent asthma, uncomplicated: Secondary | ICD-10-CM | POA: Diagnosis not present

## 2021-01-24 NOTE — Progress Notes (Signed)
   Subjective:    Patient ID: Katie Robinson, female    DOB: 11/03/1955, 65 y.o.   MRN: [id number]  HPI Documentation for virtual audio and video telecommunications through [place] encounter:  The patient was located at home. 2 patient identifiers used.  The provider was located in the office. The patient did consent to this visit and is aware of possible charges through their insurance for this visit.  The other persons participating in this telemedicine service were none. Time spent on call was 5 minutes and in review of previous records >15 minutes total for counseling and coordination of care.  This virtual service is not related to other E/M service within previous 7 days.  She states that she was exposed to [place] and on Saturday of this week developed a headache, some nasal congestion and slight cough.  She does have asthma and did use the inhaler on 1 occasion.  She states today she is feeling better.  She is scheduled for Humira shot on Monday.   Review of Systems     Objective:   Physical Exam Alert and in no distress.  Breathing pattern appears normal.       Assessment & Plan:  COVID-19 virus infection  Moderate persistent asthma without complication  Left sided ulcerative (chronic) colitis ([place]) I explained that at this time since she is feeling better, continued with conservative care is appropriate.  As long she improves she should go ahead and get the Humira shot on Monday.  She was comfortable with that.

## 2021-01-24 NOTE — Telephone Encounter (Signed)
Pt states tested positive today, exposed last Friday, has "bad head cold" Terrible headache, eye & nose running.  Taking Benadryl.  No appointments left for today.  No issues breathing.  Wants to know if she should still take her Humira for her ulcerative colitis which is scheduled to take on Monday,  she called her GI who prescribes it and was told to call PCP.  Also wants to know what else she can take for headache?

## 2021-01-24 NOTE — Telephone Encounter (Signed)
Had virtual. [facility]

## 2021-01-25 NOTE — Telephone Encounter (Signed)
done

## 2021-04-30 ENCOUNTER — Other Ambulatory Visit: Payer: Self-pay

## 2021-04-30 ENCOUNTER — Ambulatory Visit: Payer: POS | Admitting: Medical

## 2021-04-30 ENCOUNTER — Encounter: Payer: Self-pay | Admitting: Medical

## 2021-04-30 VITALS — BP 122/86 | HR 75 | Wt 236.2 lb

## 2021-04-30 DIAGNOSIS — R300 Dysuria: Secondary | ICD-10-CM | POA: Diagnosis not present

## 2021-04-30 DIAGNOSIS — Z23 Encounter for immunization: Secondary | ICD-10-CM | POA: Diagnosis not present

## 2021-04-30 LAB — POCT URINALYSIS DIP (CLINITEK)
Bilirubin, UA: NEGATIVE
Glucose, UA: NEGATIVE mg/dL
Ketones, POC UA: NEGATIVE mg/dL
Nitrite, UA: NEGATIVE
POC PROTEIN,UA: 30 — AB
Spec Grav, UA: 1.02 (ref 1.010–1.025)
Urobilinogen, UA: 0.2 E.U./dL
pH, UA: 6 (ref 5.0–8.0)

## 2021-04-30 MED ORDER — SULFAMETHOXAZOLE-TRIMETHOPRIM 800-160 MG PO TABS: 1.0000 | ORAL_TABLET | Freq: Two times a day (BID) | ORAL | 0 refills | Status: DC

## 2021-04-30 NOTE — Progress Notes (Signed)
Subjective:   Katie Robinson is a 65 y.o. female who complains of possible urinary tract infection.  She has a history of UTI and also colitis in the past.  Current symptoms have been going on a few weeks.  She notes frequent urination, painful at end of stream, strong odor.  No fever,some nausea, but no vomiting, no mid back pain.  No vaginal discharge.  No new soaps, no heavy caffeine or sugary drinks.    She would like her flu shot and COVID booster today  No other aggravating or relieving factors.  No other c/o.  Past Medical History:  Diagnosis Date   Allergy    SEASONAL   Anemia    Arthralgia    Asthma    controlled   Breast cancer, left breast ([place]) 05/2017   left breast cancer   Chronic headache    many years   COVID-19 virus infection 06/2020   Family history of premature coronary artery disease    father   Former smoker    10 pack year history   GERD (gastroesophageal reflux disease)    mild, occasional   H/O bone density study 11/2015   normal   H/O cardiovascular stress test 2009   History of radiation therapy 08/12/17- 09/08/17   Left Breast 40.05 Gy in 15 fractions, Left Breast Boost/ 10 Gy in 5 fractions.    Hx of adenomatous and sessile serrated colonic polyps 05/24/2014   Iron deficiency 11/29/2015   Obesity    Pectus excavatum    mild, left asymmetric   Personal history of radiation therapy    S/P hysterectomy    due to fibroids   Seasonal allergic rhinitis    UC (ulcerative colitis) ([place]) 2015   Dr. Carlean Purl   Urinary tract infection 2013   UTI due to extended-spectrum beta lactamase (ESBL) producing Escherichia coli 09/20/2017   Vitamin D deficiency 07/18/2014   Wears glasses     Current Outpatient Medications on File Prior to Visit  Medication Sig Dispense Refill   albuterol (VENTOLIN HFA) 108 (90 Base) MCG/ACT inhaler TAKE 2 PUFFS BY MOUTH EVERY 6 HOURS AS NEEDED FOR WHEEZE OR SHORTNESS OF BREATH 60 g 0   amLODipine (NORVASC) 5 MG tablet Take 1  tablet (5 mg total) by mouth daily. 90 tablet 3   anastrozole (ARIMIDEX) 1 MG tablet Take 1 tablet (1 mg total) by mouth daily. 90 tablet 4   Ascorbic Acid (VITAMIN C) 100 MG tablet Take 100 mg by mouth daily.     beclomethasone (QVAR REDIHALER) 40 MCG/ACT inhaler Inhale 1 puff into the lungs 2 (two) times daily. 10.6 g 5   Cholecalciferol (VITAMIN D3) 25 MCG (1000 UT) CAPS Take 2 capsules (2,000 Units total) by mouth daily. 180 capsule 3   HUMIRA 40 MG/0.4ML PSKT Inject into the skin.     zinc gluconate 50 MG tablet Take 50 mg by mouth daily.     fluticasone (VERAMYST) 27.5 MCG/SPRAY nasal spray Place 2 sprays into the nose daily. (Patient not taking: No sig reported)     No current facility-administered medications on file prior to visit.    ROS as in subjective  Reviewed allergies, medications, past medical, surgical, and social history.     Objective: BP 122/86 (BP Location: Right Arm, Patient Position: Sitting)   Pulse 75   Wt 236 lb 3.2 oz (107.1 kg)   SpO2 98%   BMI 32.03 kg/m   General appearance: alert, no distress, WD/WN, female Abdomen: +  bs, soft, non tender, non distended, no masses, no hepatomegaly, no splenomegaly, no bruits Back: no CVA tenderness GU: deferred      Assessment: Encounter Diagnoses  Name Primary?   Dysuria Yes   Needs flu shot    Need for COVID-19 vaccine      Plan: Discussed symptoms, diagnosis or urinary tract infection, possible complications, and usual course of illness.  Begin medication bactrim.  Advised increased water intake, can use OTC Tylenol for pain prn.  Urine culture sent.  We discussed symptoms of pyelonephritis or worsening symptoms that would prompt urgent recheck  Counseled on the influenza virus vaccine.  Vaccine information sheet given.  Influenza vaccine given after consent obtained.  Counseled on the Covid virus vaccine.  Vaccine information sheet given.  Covid vaccine given after consent obtained.  Katie Robinson was seen  today for dysuria.  Diagnoses and all orders for this visit:  Dysuria -     POCT URINALYSIS DIP (CLINITEK) -     Urine Culture  Needs flu shot -     Flu Vaccine QUAD 26moIM (Fluarix, Fluzone & Alfiuria Quad PF)  Need for COVID-19 vaccine -Counsellor Other orders -     sulfamethoxazole-trimethoprim (BACTRIM DS) 800-160 MG tablet; Take 1 tablet by mouth 2 (two) times daily.   Call or return if worse or not improving in the next 3-4 days.

## 2021-05-06 LAB — URINE CULTURE

## 2021-05-26 ENCOUNTER — Ambulatory Visit: Payer: POS | Admitting: Endocrinology

## 2021-05-26 ENCOUNTER — Other Ambulatory Visit: Payer: Self-pay

## 2021-05-26 VITALS — BP 150/84 | HR 89 | Ht 72.0 in | Wt 240.8 lb

## 2021-05-26 DIAGNOSIS — E559 Vitamin D deficiency, unspecified: Secondary | ICD-10-CM

## 2021-05-26 LAB — VITAMIN D 25 HYDROXY (VIT D DEFICIENCY, FRACTURES): VITD: 51.73 ng/mL (ref 30.00–100.00)

## 2021-05-26 NOTE — Progress Notes (Signed)
Subjective:    Patient ID: Katie Robinson, female    DOB: 11-23-1955, 65 y.o.   MRN: [id number]  HPI Pt returns for f/u of hypercalcemia (dx'ed 2018; she has elev PTH and low Vit-D; no h/o urolithiasis or bony fracture; she takes Vit-D, 2000 units/d, which has normalized both; DEXA was normal in 2022; 1,25-OH Vit-D was high, but ACE was normal; AP was high, but each iso fraction was normal).  pt states she feels well in general.  She takes Vit-D as rx'ed.   Past Medical History:  Diagnosis Date   Allergy    SEASONAL   Anemia    Arthralgia    Asthma    controlled   Breast cancer, left breast ([place]) 05/2017   left breast cancer   Chronic headache    many years   COVID-19 virus infection 06/2020   Family history of premature coronary artery disease    father   Former smoker    10 pack year history   GERD (gastroesophageal reflux disease)    mild, occasional   H/O bone density study 11/2015   normal   H/O cardiovascular stress test 2009   History of radiation therapy 08/12/17- 09/08/17   Left Breast 40.05 Gy in 15 fractions, Left Breast Boost/ 10 Gy in 5 fractions.    Hx of adenomatous and sessile serrated colonic polyps 05/24/2014   Iron deficiency 11/29/2015   Obesity    Pectus excavatum    mild, left asymmetric   Personal history of radiation therapy    S/P hysterectomy    due to fibroids   Seasonal allergic rhinitis    UC (ulcerative colitis) ([place]) 2015   Dr. Carlean Purl   Urinary tract infection 2013   UTI due to extended-spectrum beta lactamase (ESBL) producing Escherichia coli 09/20/2017   Vitamin D deficiency 07/18/2014   Wears glasses     Past Surgical History:  Procedure Laterality Date   BREAST LUMPECTOMY Left 06/2017   BREAST LUMPECTOMY WITH RADIOACTIVE SEED AND SENTINEL LYMPH NODE BIOPSY Left 06/29/2017   Procedure: LEFT BREAST LUMPECTOMY WITH RADIOACTIVE SEED AND LEFT AXILLARY SENTINEL LYMPH NODE BIOPSY ERAS PATHWAY;  Surgeon: Rolm Bookbinder, MD;  Location:  [facility];  Service: General;  Laterality: Left;   COLONOSCOPY  02/2016   polyps; Dr. Silvano Rusk   EVACUATION BREAST HEMATOMA Left 07/11/2017   Procedure: EVACUATION HEMATOMA BREAST;  Surgeon: Leighton Ruff, MD;  Location: [facility];  Service: General;  Laterality: Left;   FOOT TENDON SURGERY Left 2013   NAVEL REMOVED     AS A CHILD   OTHER SURGICAL HISTORY     biopsy for possible sarcoidosis   POLYPECTOMY     SKIN BIOPSY     TONSILLECTOMY     TOTAL ABDOMINAL HYSTERECTOMY  2002   total; Dr. Molli Posey   WISDOM TOOTH EXTRACTION      Social History   Socioeconomic History   Marital status: Divorced    Spouse name: Not on file   Number of children: 2   Years of education: Not on file   Highest education level: Not on file  Occupational History   Occupation: postal clerk  Tobacco Use   Smoking status: Former    Types: Cigarettes    Quit date: 06/15/1988    Years since quitting: 32.9   Smokeless tobacco: Never  Vaping Use   Vaping Use: Never used  Substance and Sexual Activity   Alcohol use: Yes    Alcohol/week: 0.0  standard drinks    Comment: rarely   Drug use: No   Sexual activity: Not on file  Other Topics Concern   Not on file  Social History Narrative   Lives with her son, has 2 adult children, not married, no exercise, active with walking on the job at the [place] [employer].   04/2018   Social Determinants of Health   Financial Resource Strain: Not on file  Food Insecurity: Not on file  Transportation Needs: Not on file  Physical Activity: Not on file  Stress: Not on file  Social Connections: Not on file  Intimate Partner Violence: Not on file    Current Outpatient Medications on File Prior to Visit  Medication Sig Dispense Refill   albuterol (VENTOLIN HFA) 108 (90 Base) MCG/ACT inhaler TAKE 2 PUFFS BY MOUTH EVERY 6 HOURS AS NEEDED FOR WHEEZE OR SHORTNESS OF BREATH 60 g 0   amLODipine (NORVASC) 5 MG tablet Take 1 tablet (5 mg total) by  mouth daily. 90 tablet 3   anastrozole (ARIMIDEX) 1 MG tablet Take 1 tablet (1 mg total) by mouth daily. 90 tablet 4   Ascorbic Acid (VITAMIN C) 100 MG tablet Take 100 mg by mouth daily.     beclomethasone (QVAR REDIHALER) 40 MCG/ACT inhaler Inhale 1 puff into the lungs 2 (two) times daily. 10.6 g 5   Cholecalciferol (VITAMIN D3) 25 MCG (1000 UT) CAPS Take 2 capsules (2,000 Units total) by mouth daily. 180 capsule 3   fluticasone (VERAMYST) 27.5 MCG/SPRAY nasal spray Place 2 sprays into the nose daily.     HUMIRA 40 MG/0.4ML PSKT Inject into the skin.     sulfamethoxazole-trimethoprim (BACTRIM DS) 800-160 MG tablet Take 1 tablet by mouth 2 (two) times daily. 10 tablet 0   zinc gluconate 50 MG tablet Take 50 mg by mouth daily.     No current facility-administered medications on file prior to visit.    No Known Allergies  Family History  Problem Relation Age of Onset   Asthma Father    Heart disease Father 51       died of MI mid 09-06-22   Hypertension Sister    Asthma Sister    Hypertension Mother    Aneurysm Mother        brain   Alzheimer's disease Mother    Heart disease Mother    Cancer Neg Hx    Stroke Neg Hx    Diabetes Neg Hx    Colon cancer Neg Hx    Colon polyps Neg Hx    Crohn's disease Neg Hx    Ulcerative colitis Neg Hx    Hypercalcemia Neg Hx     BP (!) 150/84   Pulse 89   Ht 6' (1.829 m)   Wt 240 lb 12.8 oz (109.2 kg)   SpO2 97%   BMI 32.66 kg/m    Review of Systems     Objective:   Physical Exam VITAL SIGNS:  See vs page GENERAL: no distress GAIT: normal and steady  25-OH Vit-D=51    Assessment & Plan:  Elev AP, uncontrolled. Recheck today Vit-D def: well-controlled. Please continue the same Vit-D supplement hyperPTH: recheck today

## 2021-05-26 NOTE — Patient Instructions (Addendum)
Your blood pressure is high today.  Please see your primary care provider soon, to have it rechecked.   Blood tests are requested for you today.  We'll let you know about the results.   Please come back for a follow-up appointment in 1 year.

## 2021-05-29 LAB — PTH, INTACT AND CALCIUM
Calcium: 10.5 mg/dL — ABNORMAL HIGH (ref 8.6–10.4)
PTH: 89 pg/mL — ABNORMAL HIGH (ref 16–77)

## 2021-05-29 LAB — ALKALINE PHOSPHATASE, BONE SPECIFIC: ALKALINE PHOSPHATASE, BONE SPECIFIC: 46.4 mcg/L — ABNORMAL HIGH (ref 5.6–29.0)

## 2021-06-24 ENCOUNTER — Other Ambulatory Visit: Payer: Self-pay | Admitting: Medical

## 2021-06-24 DIAGNOSIS — I10 Essential (primary) hypertension: Secondary | ICD-10-CM

## 2021-06-27 ENCOUNTER — Encounter: Payer: Self-pay | Admitting: Internal Medicine

## 2021-07-11 ENCOUNTER — Other Ambulatory Visit (INDEPENDENT_AMBULATORY_CARE_PROVIDER_SITE_OTHER): Payer: POS

## 2021-07-11 ENCOUNTER — Ambulatory Visit: Payer: POS | Admitting: Internal Medicine

## 2021-07-11 ENCOUNTER — Encounter: Payer: Self-pay | Admitting: Internal Medicine

## 2021-07-11 VITALS — BP 126/84 | HR 84 | Ht 70.0 in | Wt 235.5 lb

## 2021-07-11 DIAGNOSIS — K5150 Left sided colitis without complications: Secondary | ICD-10-CM

## 2021-07-11 DIAGNOSIS — Z86010 Personal history of colonic polyps: Secondary | ICD-10-CM

## 2021-07-11 DIAGNOSIS — R197 Diarrhea, unspecified: Secondary | ICD-10-CM

## 2021-07-11 DIAGNOSIS — Z7960 Long term (current) use of unspecified immunomodulators and immunosuppressants: Secondary | ICD-10-CM | POA: Diagnosis not present

## 2021-07-11 LAB — COMPREHENSIVE METABOLIC PANEL
ALT: 11 U/L (ref 0–35)
AST: 13 U/L (ref 0–37)
Albumin: 4.1 g/dL (ref 3.5–5.2)
Alkaline Phosphatase: 143 U/L — ABNORMAL HIGH (ref 39–117)
BUN: 18 mg/dL (ref 6–23)
CO2: 27 mEq/L (ref 19–32)
Calcium: 10.3 mg/dL (ref 8.4–10.5)
Chloride: 106 mEq/L (ref 96–112)
Creatinine, Ser: 0.68 mg/dL (ref 0.40–1.20)
GFR: 92.01 mL/min (ref >60.00)
Glucose, Bld: 85 mg/dL (ref 70–99)
Potassium: 3.7 mEq/L (ref 3.5–5.1)
Sodium: 138 mEq/L (ref 135–145)
Total Bilirubin: 0.4 mg/dL (ref 0.2–1.2)
Total Protein: 7.2 g/dL (ref 6.0–8.3)

## 2021-07-11 LAB — C-REACTIVE PROTEIN: CRP: <1 mg/dL (ref 0.5–20.0)

## 2021-07-11 LAB — CBC WITH DIFFERENTIAL/PLATELET
Basophils Absolute: 0.1 10*3/uL (ref 0.0–0.1)
Basophils Relative: 0.9 % (ref 0.0–3.0)
Eosinophils Absolute: 0.3 10*3/uL (ref 0.0–0.7)
Eosinophils Relative: 2.8 % (ref 0.0–5.0)
HCT: 40.6 % (ref 36.0–46.0)
Hemoglobin: 13.1 g/dL (ref 12.0–15.0)
Lymphocytes Relative: 39.5 % (ref 12.0–46.0)
Lymphs Abs: 3.5 10*3/uL (ref 0.7–4.0)
MCHC: 32.3 g/dL (ref 30.0–36.0)
MCV: 84 fl (ref 78.0–100.0)
Monocytes Absolute: 0.8 10*3/uL (ref 0.1–1.0)
Monocytes Relative: 8.9 % (ref 3.0–12.0)
Neutro Abs: 4.3 10*3/uL (ref 1.4–7.7)
Neutrophils Relative %: 47.9 % (ref 43.0–77.0)
Platelets: 276 10*3/uL (ref 150.0–400.0)
RBC: 4.84 Mil/uL (ref 3.87–5.11)
RDW: 14.3 % (ref 11.5–15.5)
WBC: 8.9 10*3/uL (ref 4.0–10.5)

## 2021-07-11 MED ORDER — DIPHENOXYLATE-ATROPINE 2.5-0.025 MG PO TABS: ORAL_TABLET | ORAL | 1 refills | Status: DC

## 2021-07-11 MED ORDER — HUMIRA (2 SYRINGE) 40 MG/0.4ML ~~LOC~~ PSKT: PREFILLED_SYRINGE | SUBCUTANEOUS | 5 refills | Status: DC

## 2021-07-11 NOTE — Patient Instructions (Signed)
Your provider has requested that you go to the basement level for lab work before leaving today. Press "B" on the elevator. The lab is located at the first door on the left as you exit the elevator.  Due to recent changes in healthcare laws, you may see the results of your imaging and laboratory studies on MyChart before your provider has had a chance to review them.  We understand that in some cases there may be results that are confusing or concerning to you. Not all laboratory results come back in the same time frame and the provider may be waiting for multiple results in order to interpret others.  Please give [place] 48 hours in order for your provider to thoroughly review all the results before contacting the office for clarification of your results.   We have sent the following medications to your pharmacy for you to pick up at your convenience: Lomotil  We have sent the following prescriptions to your mail in pharmacy: Humira  If you have not heard from your mail in pharmacy within 1 week or if you have not received your medication in the mail, please contact us at [phone] so we may find out why.  I appreciate the opportunity to care for you. Silvano Rusk, MD, [facility]

## 2021-07-11 NOTE — Progress Notes (Addendum)
Katie Robinson 66 y.o. Nov 15, 1955 [id number]  Assessment & Plan:   Encounter Diagnoses  Name Primary?   Left sided ulcerative (chronic) colitis ([place]) Yes   Long-term use of immunosuppressant medication - Humira    Hx of adenomatous and sessile serrated colonic polyps    Diarrhea     Her situation has deteriorated.  She says its not severe but clearly has been a change in her bowel habit she was formed and more regular before not having diarrhea symptoms.  I am going to evaluate with laboratory testing as below.  Refill Humira for 6 months.  Lomotil prescription as well.  Once I see the results we will determine the next steps.  She could need a colonoscopy sooner than planned routinely.   Orders Placed This Encounter  Procedures   Calprotectin, Fecal   CBC with Differential/Platelet   Comprehensive metabolic panel   C-reactive protein   QuantiFERON-TB Gold Plus   Meds ordered this encounter  Medications   diphenoxylate-atropine (LOMOTIL) 2.5-0.025 MG tablet    Sig: 1-2 every 6 hours prn diarrhea    Dispense:  60 tablet    Refill:  1   HUMIRA 40 MG/0.4ML PSKT    Sig: Inject into skin subcutaneous once every 2 weeks.    Dispense:  2 each    Refill:  5     I appreciate the opportunity to care for this patient. CC: Carlena Hurl, PA-C   Subjective:   Chief Complaint: Follow-up of left-sided ulcerative colitis  HPI 66 year old African-American woman on biweekly Humira for left-sided ulcerative colitis, with endoscopic and pathologic remission 2019 colonoscopy who has a history of colon polyps as well with a plan for repeat colonoscopy in 2024.  She reports having softer loose stools at times now.  She had a episode of fecal incontinence and urgency after a wedding in [place] but that seems like it was a 1 off phenomenon.  However bowel movements are not formed.  She is having some nausea.  Recent calcium was 10.5 (has hyperparathyroidism).  No significant  abdominal pain and no rectal bleeding.  Wt Readings from Last 3 Encounters:  07/11/21 235 lb 8 oz (106.8 kg)  05/26/21 240 lb 12.8 oz (109.2 kg)  04/30/21 236 lb 3.2 oz (107.1 kg)      No Known Allergies Current Meds  Medication Sig   albuterol (VENTOLIN HFA) 108 (90 Base) MCG/ACT inhaler TAKE 2 PUFFS BY MOUTH EVERY 6 HOURS AS NEEDED FOR WHEEZE OR SHORTNESS OF BREATH   amLODipine (NORVASC) 5 MG tablet Take 1 tablet (5 mg total) by mouth daily.   anastrozole (ARIMIDEX) 1 MG tablet Take 1 tablet (1 mg total) by mouth daily.   Ascorbic Acid (VITAMIN C) 100 MG tablet Take 100 mg by mouth daily.   beclomethasone (QVAR REDIHALER) 40 MCG/ACT inhaler Inhale 1 puff into the lungs 2 (two) times daily.   Cholecalciferol (VITAMIN D3) 25 MCG (1000 UT) CAPS Take 2 capsules (2,000 Units total) by mouth daily.   HUMIRA 40 MG/0.4ML PSKT Inject into the skin.   zinc gluconate 50 MG tablet Take 50 mg by mouth daily.   Past Medical History:  Diagnosis Date   Allergy    SEASONAL   Anemia    Arthralgia    Asthma    controlled   Breast cancer, left breast ([place]) 05/2017   left breast cancer   Chronic headache    many years   COVID-19 virus infection 06/2020   Family  history of premature coronary artery disease    father   Former smoker    10 pack year history   GERD (gastroesophageal reflux disease)    mild, occasional   H/O bone density study 11/2015   normal   H/O cardiovascular stress test 2009   History of radiation therapy 08/12/17- 09/08/17   Left Breast 40.05 Gy in 15 fractions, Left Breast Boost/ 10 Gy in 5 fractions.    Hx of adenomatous and sessile serrated colonic polyps 05/24/2014   Iron deficiency 11/29/2015   Obesity    Pectus excavatum    mild, left asymmetric   Personal history of radiation therapy    S/P hysterectomy    due to fibroids   Seasonal allergic rhinitis    UC (ulcerative colitis) ([place]) 2015   Dr. Carlean Purl   Urinary tract infection 2013   UTI due to  extended-spectrum beta lactamase (ESBL) producing Escherichia coli 09/20/2017   Vitamin D deficiency 07/18/2014   Wears glasses    Past Surgical History:  Procedure Laterality Date   BREAST LUMPECTOMY Left 06/2017   BREAST LUMPECTOMY WITH RADIOACTIVE SEED AND SENTINEL LYMPH NODE BIOPSY Left 06/29/2017   Procedure: LEFT BREAST LUMPECTOMY WITH RADIOACTIVE SEED AND LEFT AXILLARY SENTINEL LYMPH NODE BIOPSY ERAS PATHWAY;  Surgeon: Rolm Bookbinder, MD;  Location: [facility];  Service: General;  Laterality: Left;   COLONOSCOPY  02/2016   polyps; Dr. Silvano Rusk   EVACUATION BREAST HEMATOMA Left 07/11/2017   Procedure: EVACUATION HEMATOMA BREAST;  Surgeon: Leighton Ruff, MD;  Location[facility];  Service: General;  Laterality: Left;   FOOT TENDON SURGERY Left 2013   NAVEL REMOVED     AS A CHILD   OTHER SURGICAL HISTORY     biopsy for possible sarcoidosis   POLYPECTOMY     SKIN BIOPSY     TONSILLECTOMY     TOTAL ABDOMINAL HYSTERECTOMY  2002   total; Dr. Molli Posey   WISDOM TOOTH EXTRACTION     Social History   Social History Narrative   Lives with her son, has 2 adult children, not married, no exercise, active with walking on the job at the [place] [employer].   04/2018   family history includes Alzheimer's disease in her mother; Aneurysm in her mother; Asthma in her father and sister; Heart disease in her mother; Heart disease (age of onset: 63) in her father; Hypertension in her mother and sister.   Review of Systems As above  Objective:   Physical Exam @BP  126/84 (BP Location: Right Arm, Patient Position: Sitting, Cuff Size: Normal)    Pulse 84    Ht 5' 10"  (1.778 m) Comment: height measured without shoes   Wt 235 lb 8 oz (106.8 kg)    BMI 33.79 kg/m @  General:  NAD Eyes:   anicteric Lungs:  clear Heart::  S1S2 no rubs, murmurs or gallops Abdomen:  soft and nontender, BS+ Ext:   no edema, cyanosis or clubbing    Data Reviewed:  See HPI

## 2021-07-15 ENCOUNTER — Other Ambulatory Visit: Payer: POS

## 2021-07-15 DIAGNOSIS — K5150 Left sided colitis without complications: Secondary | ICD-10-CM

## 2021-07-15 DIAGNOSIS — Z7960 Long term (current) use of unspecified immunomodulators and immunosuppressants: Secondary | ICD-10-CM

## 2021-07-15 DIAGNOSIS — R197 Diarrhea, unspecified: Secondary | ICD-10-CM

## 2021-07-16 LAB — QUANTIFERON-TB GOLD PLUS
Mitogen-NIL: >10 IU/mL
NIL: 0.11 IU/mL
QuantiFERON-TB Gold Plus: NEGATIVE
TB1-NIL: <0 IU/mL
TB2-NIL: <0 IU/mL

## 2021-07-18 ENCOUNTER — Encounter: Payer: Self-pay | Admitting: Medical

## 2021-07-18 ENCOUNTER — Other Ambulatory Visit: Payer: Self-pay

## 2021-07-18 ENCOUNTER — Ambulatory Visit (INDEPENDENT_AMBULATORY_CARE_PROVIDER_SITE_OTHER): Payer: POS | Admitting: Medical

## 2021-07-18 VITALS — BP 124/80 | HR 91 | Ht 70.5 in | Wt 234.6 lb

## 2021-07-18 DIAGNOSIS — Z853 Personal history of malignant neoplasm of breast: Secondary | ICD-10-CM | POA: Diagnosis not present

## 2021-07-18 DIAGNOSIS — Z86010 Personal history of colonic polyps: Secondary | ICD-10-CM | POA: Diagnosis not present

## 2021-07-18 DIAGNOSIS — E210 Primary hyperparathyroidism: Secondary | ICD-10-CM

## 2021-07-18 DIAGNOSIS — Z0000 Encounter for general adult medical examination without abnormal findings: Secondary | ICD-10-CM | POA: Diagnosis not present

## 2021-07-18 DIAGNOSIS — Z1231 Encounter for screening mammogram for malignant neoplasm of breast: Secondary | ICD-10-CM

## 2021-07-18 DIAGNOSIS — Z13220 Encounter for screening for lipoid disorders: Secondary | ICD-10-CM

## 2021-07-18 DIAGNOSIS — I10 Essential (primary) hypertension: Secondary | ICD-10-CM | POA: Diagnosis not present

## 2021-07-18 DIAGNOSIS — J301 Allergic rhinitis due to pollen: Secondary | ICD-10-CM

## 2021-07-18 DIAGNOSIS — Z860101 Personal history of adenomatous and serrated colon polyps: Secondary | ICD-10-CM

## 2021-07-18 DIAGNOSIS — J4540 Moderate persistent asthma, uncomplicated: Secondary | ICD-10-CM

## 2021-07-18 DIAGNOSIS — Z7185 Encounter for immunization safety counseling: Secondary | ICD-10-CM

## 2021-07-18 DIAGNOSIS — Z7960 Long term (current) use of unspecified immunomodulators and immunosuppressants: Secondary | ICD-10-CM

## 2021-07-18 DIAGNOSIS — K5150 Left sided colitis without complications: Secondary | ICD-10-CM

## 2021-07-18 DIAGNOSIS — E559 Vitamin D deficiency, unspecified: Secondary | ICD-10-CM

## 2021-07-18 DIAGNOSIS — Z131 Encounter for screening for diabetes mellitus: Secondary | ICD-10-CM

## 2021-07-18 LAB — CALPROTECTIN, FECAL: Calprotectin, Fecal: 355 ug/g — ABNORMAL HIGH (ref 0–120)

## 2021-07-18 MED ORDER — CETIRIZINE HCL 10 MG PO TABS: 10.0000 mg | ORAL_TABLET | Freq: Every day | ORAL | 2 refills | Status: DC

## 2021-07-18 MED ORDER — OMEPRAZOLE 40 MG PO CPDR: 40.0000 mg | DELAYED_RELEASE_CAPSULE | Freq: Every day | ORAL | 2 refills | Status: DC

## 2021-07-18 NOTE — Progress Notes (Signed)
Subjective: Chief Complaint  Patient presents with   fasitng cpe    Fasting cpe, no concerns. Declines pelvic but would like breast exam done today   Medical team: Dr. Lurline Del, medical oncology Dr. Eppie Gibson, radiation oncology Dr. Silvano Rusk, GI Dr. Mina Marble, [facility] Dr. Renato Shin, endocrinology Dr. Melida Quitter, [facility], [facility] Dentist Ronella Plunk, Camelia Eng, PA-C for primary care  Concerns:  Sees Dr. Jana Hakim on a yearly basis  Sees Dr. Carlean Purl regularly for ulcerative colitis.  Had some recent stomach issues, saw Dr. Carlean Purl recently.  Had some recent labs.  Her major issues is bowels and stomach issues, lots of gas lately  Diagnosed with breast cancer 05/2017, left breast, had lumpectomy and radiation therapy.     Asthma - usually albuterol occasionally.  Sometimes uses at bedtime.  No recent problems..  She is status post hysterectomy and bilateral salpingo-oophorectomy  Has some ongoing issues with throat, has seen ENT prior for same, tickle in throat, makes her cough.     Past Medical History:  Diagnosis Date   Allergy    SEASONAL   Anemia    Arthralgia    Asthma    controlled   Breast cancer, left breast ([place]) 05/2017   left breast cancer   Chronic headache    many years   COVID-19 virus infection 06/2020   Family history of premature coronary artery disease    father   Former smoker    10 pack year history   GERD (gastroesophageal reflux disease)    mild, occasional   H/O bone density study 11/2015   normal   H/O cardiovascular stress test 2009   History of radiation therapy 08/12/17- 09/08/17   Left Breast 40.05 Gy in 15 fractions, Left Breast Boost/ 10 Gy in 5 fractions.    Hx of adenomatous and sessile serrated colonic polyps 05/24/2014   Iron deficiency 11/29/2015   Obesity    Pectus excavatum    mild, left asymmetric   Personal history of radiation therapy    S/P hysterectomy    due to fibroids   Seasonal allergic  rhinitis    UC (ulcerative colitis) ([place]) 2015   Dr. Carlean Purl   Urinary tract infection 2013   UTI due to extended-spectrum beta lactamase (ESBL) producing Escherichia coli 09/20/2017   Vitamin D deficiency 07/18/2014   Wears glasses     Past Surgical History:  Procedure Laterality Date   BREAST LUMPECTOMY Left 06/2017   BREAST LUMPECTOMY WITH RADIOACTIVE SEED AND SENTINEL LYMPH NODE BIOPSY Left 06/29/2017   Procedure: LEFT BREAST LUMPECTOMY WITH RADIOACTIVE SEED AND LEFT AXILLARY SENTINEL LYMPH NODE BIOPSY ERAS PATHWAY;  Surgeon: Rolm Bookbinder, MD;  Location: [place];  Service: General;  Laterality: Left;   COLONOSCOPY  02/2016   polyps; Dr. Silvano Rusk   EVACUATION BREAST HEMATOMA Left 07/11/2017   Procedure: EVACUATION HEMATOMA BREAST;  Surgeon: Leighton Ruff, MD;  Location: [facility];  Service: General;  Laterality: Left;   FOOT TENDON SURGERY Left 2013   NAVEL REMOVED     AS A CHILD   OTHER SURGICAL HISTORY     biopsy for possible sarcoidosis   POLYPECTOMY     SKIN BIOPSY     TONSILLECTOMY     TOTAL ABDOMINAL HYSTERECTOMY  2002   total; Dr. Molli Posey   WISDOM TOOTH EXTRACTION       Family History  Problem Relation Age of Onset   Asthma Father    Heart disease  Father 47       died of MI mid 09/03/2022   Hypertension Sister    Asthma Sister    Hypertension Mother    Aneurysm Mother        brain   Alzheimer's disease Mother    Heart disease Mother    Cancer Neg Hx    Stroke Neg Hx    Diabetes Neg Hx    Colon cancer Neg Hx    Colon polyps Neg Hx    Crohn's disease Neg Hx    Ulcerative colitis Neg Hx    Hypercalcemia Neg Hx      Current Outpatient Medications:    albuterol (VENTOLIN HFA) 108 (90 Base) MCG/ACT inhaler, TAKE 2 PUFFS BY MOUTH EVERY 6 HOURS AS NEEDED FOR WHEEZE OR SHORTNESS OF BREATH, Disp: 60 g, Rfl: 0   amLODipine (NORVASC) 5 MG tablet, Take 1 tablet (5 mg total) by mouth daily., Disp: 90 tablet, Rfl: 3   anastrozole (ARIMIDEX) 1  MG tablet, Take 1 tablet (1 mg total) by mouth daily., Disp: 90 tablet, Rfl: 4   cetirizine (ZYRTEC) 10 MG tablet, Take 1 tablet (10 mg total) by mouth at bedtime., Disp: 30 tablet, Rfl: 2   Cholecalciferol (VITAMIN D3) 25 MCG (1000 UT) CAPS, Take 2 capsules (2,000 Units total) by mouth daily., Disp: 180 capsule, Rfl: 3   diphenoxylate-atropine (LOMOTIL) 2.5-0.025 MG tablet, 1-2 every 6 hours prn diarrhea, Disp: 60 tablet, Rfl: 1   HUMIRA 40 MG/0.4ML PSKT, Inject into skin subcutaneous once every 2 weeks., Disp: 2 each, Rfl: 5   omeprazole (PRILOSEC) 40 MG capsule, Take 1 capsule (40 mg total) by mouth daily., Disp: 30 capsule, Rfl: 2   zinc gluconate 50 MG tablet, Take 50 mg by mouth daily., Disp: , Rfl:    Ascorbic Acid (VITAMIN C) 100 MG tablet, Take 100 mg by mouth daily. (Patient not taking: Reported on 07/18/2021), Disp: , Rfl:    beclomethasone (QVAR REDIHALER) 40 MCG/ACT inhaler, Inhale 1 puff into the lungs 2 (two) times daily. (Patient not taking: Reported on 07/18/2021), Disp: 10.6 g, Rfl: 5  No Known Allergies    Objective: BP 124/80    Pulse 91    Ht 5' 10.5" (1.791 m)    Wt 234 lb 9.6 oz (106.4 kg)    BMI 33.19 kg/m   Wt Readings from Last 3 Encounters:  07/18/21 234 lb 9.6 oz (106.4 kg)  07/11/21 235 lb 8 oz (106.8 kg)  05/26/21 240 lb 12.8 oz (109.2 kg)   BP Readings from Last 3 Encounters:  07/18/21 124/80  07/11/21 126/84  05/26/21 (!) 150/84    General appearance: alert, no distress, WD/WN, AA female HEENT: normocephalic, sclerae anicteric, PERRLA, EOMi, nares patent, no discharge or erythema, pharynx normal Oral cavity: MMM, no lesions Neck: supple, no lymphadenopathy, no thyromegaly, no masses, no bruits Heart: RRR, normal S1, S2, no murmurs Lungs: CTA bilaterally, no wheezes, rhonchi, or rales Abdomen: +bs, soft, non tender, non distended, no masses, no hepatomegaly, no splenomegaly Back: non tender Musculoskeletal: nontender, no swelling, no obvious  deformity Extremities: no edema, no cyanosis, no clubbing Pulses: 2+ symmetric, upper and lower extremities, normal cap refill Neurological: alert, oriented x 3, CN2-12 intact, strength normal upper extremities and lower extremities, sensation normal throughout, DTRs 2+ throughout, no cerebellar signs, gait normal Psychiatric: normal affect, behavior normal, pleasant   Breast: Left breast at the 3 oclock and 7 oclock area with generalized dense tissue since prior surgery, otherwise breast nontender,  no masses or lumps, no skin changes, no nipple discharge or inversion, no axillary lymphadenopathy, exam chaperoned by nurse Gyn/rectal - deferred    Assessment: Encounter Diagnoses  Name Primary?   Encounter for health maintenance examination in adult Yes   Essential hypertension    History of breast cancer    Hx of adenomatous and sessile serrated colonic polyps    Hyperparathyroidism, primary ([place])    Left sided ulcerative (chronic) colitis ([place])    Long-term use of immunosuppressant medication - Humira    Moderate persistent asthma without complication    Vitamin D deficiency    Vaccine counseling    Allergic rhinitis due to pollen, unspecified seasonality    Screening for lipid disorders    Screening for diabetes mellitus    Encounter for screening mammogram for malignant neoplasm of breast       Plan: Today you had a preventative care visit or wellness visit.    Topics today may have included healthy lifestyle, diet, exercise, preventative care, vaccinations, sick and well care, proper use of emergency dept and after hours care, as well as other concerns.     Recommendations: Continue to return yearly for your annual wellness and preventative care visits.  This gives [place] a chance to discuss healthy lifestyle, exercise, vaccinations, review your chart record, and perform screenings where appropriate.  I recommend you see your eye doctor yearly for routine vision care.  I  recommend you see your dentist yearly for routine dental care including hygiene visits twice yearly.  See your specialists as usual    Vaccination recommendations were reviewed Immunization History  Administered Date(s) Administered   Influenza,inj,Quad PF,6+ Mos 05/30/2013, 04/22/2016, 05/10/2017, 04/21/2018, 04/30/2021   Influenza,inj,quad, With Preservative 03/19/2019   PFIZER(Purple Top)SARS-COV-2 Vaccination 08/18/2019, 09/18/2019, 03/26/2020   Pfizer Covid-19 Vaccine Bivalent Booster 60yr & up 04/30/2021   Pneumococcal Conjugate-13 05/30/2013, 11/26/2015   Pneumococcal Polysaccharide-23 09/16/2017   Tdap 05/30/2013   Zoster Recombinat (Shingrix) 07/01/2018, 09/12/2018   I recommend a yearly flu shot   Screening for cancer: Breast cancer screening: You should perform a self breast exam monthly.   We reviewed recommendations for regular mammograms and breast cancer screening.  Get your mammogram yearly  Colon cancer screening:  I reviewed your colonoscopy on file that is up to date from 2017  Cervical cancer screening: You are status post hysterectomy  Skin cancer screening: Check your skin regularly for new changes, growing lesions, or other lesions of concern Come in for evaluation if you have skin lesions of concern.  Lung cancer screening: If you have a greater than 30 pack year history of tobacco use, then you qualify for lung cancer screening with a chest CT scan  We currently don't have screenings for other cancers besides breast, cervical, colon, and lung cancers.  If you have a strong family history of cancer or have other cancer screening concerns, please let me know.    Bone health: Get at least 150 minutes of aerobic exercise weekly Get weight bearing exercise at least once weekly I reviewed the 06/2020 bone density test with normal result!  Heart health: Get at least 150 minutes of aerobic exercise weekly Limit alcohol It is important to maintain a  healthy blood pressure and healthy cholesterol numbers   Separate significant issues discussed: Hypertension-continue current medication  History of breast cancer-continue routine mammogram  History of ulcerative colitis-managed by gastroenterology  Vitamin D deficiency-continue supplement  Alkaline phosphatase elevated-managed by endocrinology  Asthma-continue albuterol as needed.  Elevated PTH and calcium-follow-up with endocrinology as scheduled   Hae was seen today for fasitng cpe.  Diagnoses and all orders for this visit:  Encounter for health maintenance examination in adult -     Lipid panel -     Hemoglobin A1c  Essential hypertension  History of breast cancer -     MM Digital Diagnostic Unilat L; Future -     MM Digital Screening Unilat R; Future  Hx of adenomatous and sessile serrated colonic polyps  Hyperparathyroidism, primary ([place])  Left sided ulcerative (chronic) colitis ([place])  Long-term use of immunosuppressant medication - Humira  Moderate persistent asthma without complication  Vitamin D deficiency  Vaccine counseling  Allergic rhinitis due to pollen, unspecified seasonality  Screening for lipid disorders -     Hemoglobin A1c  Screening for diabetes mellitus -     Lipid panel  Encounter for screening mammogram for malignant neoplasm of breast -     MM Digital Diagnostic Unilat L; Future -     MM Digital Screening Unilat R; Future  Other orders -     omeprazole (PRILOSEC) 40 MG capsule; Take 1 capsule (40 mg total) by mouth daily. -     cetirizine (ZYRTEC) 10 MG tablet; Take 1 tablet (10 mg total) by mouth at bedtime.    F/u pending labs

## 2021-07-19 LAB — LIPID PANEL
Chol/HDL Ratio: 2.6 ratio (ref 0.0–4.4)
Cholesterol, Total: 214 mg/dL — ABNORMAL HIGH (ref 100–199)
HDL: 82 mg/dL (ref >39)
LDL Chol Calc (NIH): 118 mg/dL — ABNORMAL HIGH (ref 0–99)
Triglycerides: 77 mg/dL (ref 0–149)
VLDL Cholesterol Cal: 14 mg/dL (ref 5–40)

## 2021-07-19 LAB — HEMOGLOBIN A1C
Est. average glucose Bld gHb Est-mCnc: 117 mg/dL
Hgb A1c MFr Bld: 5.7 % — ABNORMAL HIGH (ref 4.8–5.6)

## 2021-07-21 ENCOUNTER — Other Ambulatory Visit: Payer: Self-pay | Admitting: Medical

## 2021-07-21 DIAGNOSIS — I10 Essential (primary) hypertension: Secondary | ICD-10-CM

## 2021-07-21 MED ORDER — VITAMIN D 50 MCG (2000 UT) PO CAPS: 1.0000 | ORAL_CAPSULE | Freq: Every day | ORAL | 3 refills | Status: DC

## 2021-07-21 MED ORDER — AMLODIPINE BESYLATE 5 MG PO TABS: 5.0000 mg | ORAL_TABLET | Freq: Every day | ORAL | 3 refills | Status: DC

## 2021-07-21 MED ORDER — OMEPRAZOLE 40 MG PO CPDR: 40.0000 mg | DELAYED_RELEASE_CAPSULE | Freq: Every day | ORAL | 3 refills | Status: DC

## 2021-07-21 MED ORDER — CETIRIZINE HCL 10 MG PO TABS: 10.0000 mg | ORAL_TABLET | Freq: Every day | ORAL | 3 refills | Status: DC

## 2021-07-24 ENCOUNTER — Other Ambulatory Visit: Payer: Self-pay

## 2021-07-24 ENCOUNTER — Other Ambulatory Visit: Payer: Self-pay | Admitting: Medical

## 2021-07-24 DIAGNOSIS — Z853 Personal history of malignant neoplasm of breast: Secondary | ICD-10-CM

## 2021-07-24 DIAGNOSIS — Z7960 Long term (current) use of unspecified immunomodulators and immunosuppressants: Secondary | ICD-10-CM

## 2021-07-24 DIAGNOSIS — Z1231 Encounter for screening mammogram for malignant neoplasm of breast: Secondary | ICD-10-CM

## 2021-07-24 NOTE — Addendum Note (Signed)
Addended by: Gillermina Hu on: 07/24/2021 10:47 AM   Modules accepted: Orders

## 2021-08-06 ENCOUNTER — Telehealth: Payer: Self-pay | Admitting: Internal Medicine

## 2021-08-06 NOTE — Telephone Encounter (Signed)
[facility] Rx rep called to confirm the dosage of injection for the Humira medication. Please call [phone] and the reference # [id number].

## 2021-08-07 NOTE — Telephone Encounter (Signed)
Inbound call from Amy with [facility] calling in regards to patient's Humira; needs clarification on script.  Can be reached at [phone].

## 2021-08-07 NOTE — Telephone Encounter (Signed)
[facility] Rx Rep Apolonio Schneiders was notified of the dosage of the medication of the Humira: Prescription verified as written:

## 2021-08-11 ENCOUNTER — Other Ambulatory Visit: Payer: Self-pay

## 2021-08-11 ENCOUNTER — Ambulatory Visit
Admission: RE | Admit: 2021-08-11 | Discharge: 2021-08-11 | Disposition: A | Discharge status: Home / Self Care | Payer: POS | Source: Ambulatory Visit | Attending: Medical | Admitting: Medical

## 2021-08-11 DIAGNOSIS — Z853 Personal history of malignant neoplasm of breast: Secondary | ICD-10-CM

## 2021-08-11 DIAGNOSIS — Z1231 Encounter for screening mammogram for malignant neoplasm of breast: Secondary | ICD-10-CM

## 2021-08-11 NOTE — Telephone Encounter (Signed)
Spoke to [employer]. Lorelee New  stated that there are no current issues: Delivery is set for tomorrow and pt should receive Humira on Wednesday:

## 2021-09-15 ENCOUNTER — Inpatient Hospital Stay: Payer: POS | Attending: Hematology and Oncology | Admitting: Hematology and Oncology

## 2021-09-15 ENCOUNTER — Other Ambulatory Visit: Payer: Self-pay

## 2021-09-15 ENCOUNTER — Encounter: Payer: Self-pay | Admitting: Hematology and Oncology

## 2021-09-15 ENCOUNTER — Other Ambulatory Visit: Payer: Self-pay | Admitting: *Deleted

## 2021-09-15 ENCOUNTER — Inpatient Hospital Stay: Payer: POS

## 2021-09-15 VITALS — BP 145/90 | HR 78 | Temp 97.7°F | Resp 16 | Wt 239.9 lb

## 2021-09-15 DIAGNOSIS — Z8249 Family history of ischemic heart disease and other diseases of the circulatory system: Secondary | ICD-10-CM | POA: Insufficient documentation

## 2021-09-15 DIAGNOSIS — Z818 Family history of other mental and behavioral disorders: Secondary | ICD-10-CM | POA: Insufficient documentation

## 2021-09-15 DIAGNOSIS — I10 Essential (primary) hypertension: Secondary | ICD-10-CM | POA: Insufficient documentation

## 2021-09-15 DIAGNOSIS — Z90722 Acquired absence of ovaries, bilateral: Secondary | ICD-10-CM | POA: Insufficient documentation

## 2021-09-15 DIAGNOSIS — Z87440 Personal history of urinary (tract) infections: Secondary | ICD-10-CM | POA: Insufficient documentation

## 2021-09-15 DIAGNOSIS — Z87891 Personal history of nicotine dependence: Secondary | ICD-10-CM | POA: Insufficient documentation

## 2021-09-15 DIAGNOSIS — Z8616 Personal history of COVID-19: Secondary | ICD-10-CM | POA: Diagnosis not present

## 2021-09-15 DIAGNOSIS — D0512 Intraductal carcinoma in situ of left breast: Secondary | ICD-10-CM

## 2021-09-15 DIAGNOSIS — Z923 Personal history of irradiation: Secondary | ICD-10-CM | POA: Diagnosis not present

## 2021-09-15 DIAGNOSIS — Z79899 Other long term (current) drug therapy: Secondary | ICD-10-CM | POA: Diagnosis not present

## 2021-09-15 DIAGNOSIS — Z86010 Personal history of colonic polyps: Secondary | ICD-10-CM | POA: Insufficient documentation

## 2021-09-15 DIAGNOSIS — M25562 Pain in left knee: Secondary | ICD-10-CM | POA: Diagnosis not present

## 2021-09-15 DIAGNOSIS — Z79811 Long term (current) use of aromatase inhibitors: Secondary | ICD-10-CM | POA: Diagnosis not present

## 2021-09-15 DIAGNOSIS — Z8719 Personal history of other diseases of the digestive system: Secondary | ICD-10-CM | POA: Diagnosis not present

## 2021-09-15 DIAGNOSIS — Z6833 Body mass index (BMI) 33.0-33.9, adult: Secondary | ICD-10-CM | POA: Insufficient documentation

## 2021-09-15 DIAGNOSIS — R232 Flushing: Secondary | ICD-10-CM | POA: Insufficient documentation

## 2021-09-15 DIAGNOSIS — Z836 Family history of other diseases of the respiratory system: Secondary | ICD-10-CM | POA: Insufficient documentation

## 2021-09-15 DIAGNOSIS — E213 Hyperparathyroidism, unspecified: Secondary | ICD-10-CM | POA: Insufficient documentation

## 2021-09-15 DIAGNOSIS — M25561 Pain in right knee: Secondary | ICD-10-CM | POA: Insufficient documentation

## 2021-09-15 LAB — CBC WITH DIFFERENTIAL (CANCER CENTER ONLY)
Abs Immature Granulocytes: 0.02 10*3/uL (ref 0.00–0.07)
Basophils Absolute: 0.1 10*3/uL (ref 0.0–0.1)
Basophils Relative: 1 %
Eosinophils Absolute: 0.3 10*3/uL (ref 0.0–0.5)
Eosinophils Relative: 3 %
HCT: 40.4 % (ref 36.0–46.0)
Hemoglobin: 12.9 g/dL (ref 12.0–15.0)
Immature Granulocytes: 0 %
Lymphocytes Relative: 43 %
Lymphs Abs: 4.2 10*3/uL — ABNORMAL HIGH (ref 0.7–4.0)
MCH: 27.2 pg (ref 26.0–34.0)
MCHC: 31.9 g/dL (ref 30.0–36.0)
MCV: 85.2 fL (ref 80.0–100.0)
Monocytes Absolute: 0.7 10*3/uL (ref 0.1–1.0)
Monocytes Relative: 7 %
Neutro Abs: 4.5 10*3/uL (ref 1.7–7.7)
Neutrophils Relative %: 46 %
Platelet Count: 280 10*3/uL (ref 150–400)
RBC: 4.74 MIL/uL (ref 3.87–5.11)
RDW: 13.2 % (ref 11.5–15.5)
WBC Count: 9.7 10*3/uL (ref 4.0–10.5)
nRBC: 0 % (ref 0.0–0.2)

## 2021-09-15 LAB — CMP (CANCER CENTER ONLY)
ALT: 9 U/L (ref 0–44)
AST: 13 U/L — ABNORMAL LOW (ref 15–41)
Albumin: 4.1 g/dL (ref 3.5–5.0)
Alkaline Phosphatase: 149 U/L — ABNORMAL HIGH (ref 38–126)
Anion gap: 7 (ref 5–15)
BUN: 15 mg/dL (ref 8–23)
CO2: 25 mmol/L (ref 22–32)
Calcium: 10.5 mg/dL — ABNORMAL HIGH (ref 8.9–10.3)
Chloride: 107 mmol/L (ref 98–111)
Creatinine: 0.69 mg/dL (ref 0.44–1.00)
GFR, Estimated: >60 mL/min (ref >60)
Glucose, Bld: 96 mg/dL (ref 70–99)
Potassium: 3.7 mmol/L (ref 3.5–5.1)
Sodium: 139 mmol/L (ref 135–145)
Total Bilirubin: 0.3 mg/dL (ref 0.3–1.2)
Total Protein: 7.5 g/dL (ref 6.5–8.1)

## 2021-09-15 NOTE — Progress Notes (Signed)
Seen goal ?[place]  ?[phone] [phone]  ? ? ? ?ID: Katie Robinson DOB: 1956-05-17  MR#: [id number]  JKD#:[id number] ? ?Patient Care Team: ?Tysinger, Leward Quan as PCP - General (Family Medicine) ?Magrinat, Virgie Dad, MD (Inactive) as Consulting Physician (Oncology) ?Gatha Mayer, MD as Consulting Physician (Gastroenterology) ?Eppie Gibson, MD as Attending Physician (Radiation Oncology) ?Rolm Bookbinder, MD as Consulting Physician (General Surgery) ?Gardenia Phlegm, NP as Nurse Practitioner (Hematology and Oncology) ?OTHER MD: ? ?CHIEF COMPLAINT: DCIS estrogen receptor positive ? ?CURRENT TREATMENT: Anastrozole ? ? ?INTERVAL HISTORY: ?Syndey returns today for follow-up of her noninvasive breast cancer. ? ?The patient continues on anastrozole.  ?Last mammogram in February 2023, BI-RADS Category 2, benign. ?She also underwent bone density screening on 06/20/2020 showing a T-score of -0.6, which is considered normal. ?Patient is doing well on anastrozole except for some hot flashes and arthralgias which are overall tolerable. ?She does deal with some bilateral knee pains which she does attribute to osteoarthritis and wonders if she needs knee replacements.  She takes vitamin D every day, cannot tolerate calcium because of underlying hyperparathyroidism. ? ?REVIEW OF SYSTEMS: ?Abagael continues to work third shift, which she has done for many years.  She is just beginning to think about retiring but does not know what she would do with her free time.  She had minimal symptoms with her Covid infection earlier this year. ? ?GM ? ? COVID 19 VACCINATION STATUS: fully vaccinated [employer]), with booster 03/2020; also had COVID-19 infection January 2022.   ? ? ?HISTORY OF CURRENT ILLNESS: ?From the original intake note: ? ?Katie Robinson had routine screening mammography on 06/07/2017 showing a possible abnormality in the left breast. She underwent unilateral left  breast diagnostic mammography with tomography and left axilla ultrasonography on 06/17/2017 at [facility] on 06/10/2017 showing: Suspicious, grouped left breast calcifications. Negative for left axillary lymphadenopathy. ? ?Accordingly on 06/14/2017 she proceeded to biopsy of the left breast area in question. 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 Focus Highly suspicious for stromal invasion. Prognostic indicators significant for: estrogen receptor, 50% positive with moderate staining intensity and progesterone receptor, 0% negative. ? ?The patient's subsequent history is as detailed below. ? ? ?PAST MEDICAL HISTORY: ?Past Medical History:  ?Diagnosis Date  ? Allergy   ? SEASONAL  ? Anemia   ? Arthralgia   ? Asthma   ? controlled  ? Breast cancer, left breast ([place]) 05/2017  ? left breast cancer  ? Chronic headache   ? many years  ? COVID-19 virus infection 06/2020  ? Family history of premature coronary artery disease   ? father  ? Former smoker   ? 10 pack year history  ? GERD (gastroesophageal reflux disease)   ? mild, occasional  ? H/O bone density study 11/2015  ? normal  ? H/O cardiovascular stress test 2009  ? History of radiation therapy 08/12/17- 09/08/17  ? Left Breast 40.05 Gy in 15 fractions, Left Breast Boost/ 10 Gy in 5 fractions.   ? Hx of adenomatous and sessile serrated colonic polyps 05/24/2014  ? Iron deficiency 11/29/2015  ? Obesity   ? Pectus excavatum   ? mild, left asymmetric  ? Personal history of radiation therapy   ? S/P hysterectomy   ? due to fibroids  ? Seasonal allergic rhinitis   ? UC (ulcerative colitis) ([place]) 2015  ?  Dr. Carlean Purl  ? Urinary tract infection 09-02-11  ? UTI due to extended-spectrum beta lactamase (ESBL) producing Escherichia coli 09/20/2017  ? Vitamin D deficiency 07/18/2014  ? Wears glasses   ? ? ?PAST SURGICAL HISTORY: ?Past Surgical History:  ?Procedure  Laterality Date  ? BREAST LUMPECTOMY Left 06/2017  ? BREAST LUMPECTOMY WITH RADIOACTIVE SEED AND SENTINEL LYMPH NODE BIOPSY Left 06/29/2017  ? Procedure: LEFT BREAST LUMPECTOMY WITH RADIOACTIVE SEED AND LEFT AXILLARY SENTINEL LYMPH NODE BIOPSY ERAS PATHWAY;  Surgeon: Rolm Bookbinder, MD;  Location: [facility];  Service: General;  Laterality: Left;  ? COLONOSCOPY  02/2016  ? polyps; Dr. Silvano Rusk  ? EVACUATION BREAST HEMATOMA Left 07/11/2017  ? Procedure: EVACUATION HEMATOMA BREAST;  Surgeon: Leighton Ruff, MD;  Location: [facility];  Service: General;  Laterality: Left;  ? FOOT TENDON SURGERY Left September 02, 2011  ? NAVEL REMOVED    ? AS A CHILD  ? OTHER SURGICAL HISTORY    ? biopsy for possible sarcoidosis  ? POLYPECTOMY    ? SKIN BIOPSY    ? TONSILLECTOMY    ? TOTAL ABDOMINAL HYSTERECTOMY  2000-09-01  ? total; Dr. Molli Posey  ? WISDOM TOOTH EXTRACTION    ? ? ?FAMILY HISTORY ?Family History  ?Problem Relation Age of Onset  ? Asthma Father   ? Heart disease Father 39  ?     died of MI mid 09-01-2022  ? Hypertension Sister   ? Asthma Sister   ? Hypertension Mother   ? Aneurysm Mother   ?     brain  ? Alzheimer's disease Mother   ? Heart disease Mother   ? Cancer Neg Hx   ? Stroke Neg Hx   ? Diabetes Neg Hx   ? Colon cancer Neg Hx   ? Colon polyps Neg Hx   ? Crohn's disease Neg Hx   ? Ulcerative colitis Neg Hx   ? Hypercalcemia Neg Hx   ?Mother passed at due to brain aneurysm and she also had Alzhimer's. Father passed at 68 due to heart attack and asthma. She has 1 living sister with good health. No history with breast or ovarian cancer in the family. ? ? ?GYNECOLOGIC HISTORY:  ?No LMP recorded. Patient has had a hysterectomy. ?Menarche: 66 years old ?Age at first live birth: 66 years old ?GP: GxP2 ?HRT: Did not use hormone replacement ?She has a total hysterectomy with bilateral salpingo-oophorectomy in 01-Sep-2000 due to fibroids and a chocolate cyst on her ovaries. ? ? ?SOCIAL HISTORY:  ?She works as a Scientist, research (medical). She is  divorced. Her son lives with her and is unmarried. He works at [employer] as a Technical brewer. Her daughter is a Chiropractor at [employer].  The patient has 1 grandchild. She belongs to [employer]. ?  ? ADVANCED DIRECTIVES: Not in place; at the 06/23/2017 visit the patient was given the appropriate documents to complete on notarized at her discretion ? ? ?HEALTH MAINTENANCE: ?Social History  ? ?Tobacco Use  ? Smoking status: Former  ?  Types: Cigarettes  ?  Quit date: 06/15/1988  ?  Years since quitting: 33.2  ? Smokeless tobacco: Never  ?Vaping Use  ? Vaping Use: Never used  ?Substance Use Topics  ? Alcohol use: Yes  ?  Alcohol/week: 0.0 standard drinks  ?  Comment: rarely  ? Drug use: No  ? ? ? Colonoscopy: 11/25/2017/ [facility]/ normal ? PAP: Status post Hysterectomy/ BSO 2000/09/01 ? Bone density: 12/02/2015 T-score +0.2  normal ?  ?No Known Allergies ? ?Current Outpatient Medications  ?Medication Sig Dispense Refill  ? albuterol (VENTOLIN HFA) 108 (90 Base) MCG/ACT inhaler TAKE 2 PUFFS BY MOUTH EVERY 6 HOURS AS NEEDED FOR WHEEZE OR SHORTNESS OF BREATH 60 g 0  ? amLODipine (NORVASC) 5 MG tablet Take 1 tablet (5 mg total) by mouth daily. 90 tablet 3  ? anastrozole (ARIMIDEX) 1 MG tablet Take 1 tablet (1 mg total) by mouth daily. 90 tablet 4  ? Ascorbic Acid (VITAMIN C) 100 MG tablet Take 100 mg by mouth daily. (Patient not taking: Reported on 07/18/2021)    ? cetirizine (ZYRTEC) 10 MG tablet Take 1 tablet (10 mg total) by mouth at bedtime. 90 tablet 3  ? Cholecalciferol (VITAMIN D) 50 MCG (2000 UT) CAPS Take 1 capsule (2,000 Units total) by mouth daily. 90 capsule 3  ? diphenoxylate-atropine (LOMOTIL) 2.5-0.025 MG tablet 1-2 every 6 hours prn diarrhea 60 tablet 1  ? HUMIRA 40 MG/0.4ML PSKT Inject into skin subcutaneous once every 2 weeks. 2 each 5  ? omeprazole (PRILOSEC) 40 MG capsule Take 1 capsule (40 mg total) by mouth daily. 90 capsule 3  ? zinc gluconate 50 MG tablet Take 50 mg by  mouth daily.    ? ?No current facility-administered medications for this visit.  ? ? ?OBJECTIVE: Middle-aged African-American woman in no acute distress ? ?Vitals:  ? 09/15/21 0902  ?BP: (!) 145/90  ?Pulse: 78  ?

## 2021-09-16 ENCOUNTER — Telehealth: Payer: Self-pay | Admitting: Hematology and Oncology

## 2021-09-16 NOTE — Telephone Encounter (Signed)
Scheduled appointment per 04/03 los. Patient aware. ?

## 2021-09-26 ENCOUNTER — Telehealth: Payer: Self-pay

## 2021-09-26 NOTE — Telephone Encounter (Signed)
PA was submitted through [facility] for pt's Humira: Pt approved Coverage through 08/27/2021 through 03/25/2022: Case # [id number]: ?Prescription Form Filled out and was signed By Dr Carlean Purl wet signature and Faxed to [facility] [phone] ?

## 2021-10-14 ENCOUNTER — Other Ambulatory Visit: Payer: Self-pay | Admitting: Medical

## 2021-11-26 ENCOUNTER — Telehealth: Payer: Self-pay | Admitting: Medical

## 2021-11-26 MED ORDER — AMLODIPINE BESYLATE 5 MG PO TABS: 5.0000 mg | ORAL_TABLET | Freq: Every day | ORAL | 0 refills | Status: DC

## 2021-11-26 NOTE — Telephone Encounter (Signed)
Pt left voice mail  she needs 1 month rx amlodipine sent to local pharmacy until mail order comes in   Send to [place]

## 2021-11-26 NOTE — Telephone Encounter (Signed)
done

## 2021-12-23 ENCOUNTER — Other Ambulatory Visit: Payer: Self-pay | Admitting: Medical

## 2021-12-23 ENCOUNTER — Telehealth: Payer: Self-pay

## 2021-12-23 NOTE — Telephone Encounter (Signed)
Received faxed documents from [facility] concerning  Prescription and Supply Request Form: Documents completed, signed and Faxed to [phone]

## 2022-01-06 ENCOUNTER — Other Ambulatory Visit: Payer: Self-pay | Admitting: Medical

## 2022-02-18 ENCOUNTER — Encounter: Payer: Self-pay | Admitting: Internal Medicine

## 2022-03-13 ENCOUNTER — Telehealth: Payer: Self-pay

## 2022-03-13 ENCOUNTER — Other Ambulatory Visit (HOSPITAL_COMMUNITY): Payer: Self-pay

## 2022-03-13 NOTE — Telephone Encounter (Signed)
Patient Advocate Encounter  Prior Authorization for Humira (CF) 40MG/0.4ML syringe kit has been approved.    Key: [id number] Effective dates: 02/11/2022 through 03/13/2023

## 2022-03-13 NOTE — Telephone Encounter (Signed)
PA renewal initiated automatically by CoverMyMeds.  Submitted a Prior Authorization request to [facility] for Humira (CF) 40MG/0.4ML syringe kit via CoverMyMeds. Will update once we receive a response.   Key: [id number]

## 2022-03-24 ENCOUNTER — Encounter: Payer: Self-pay | Admitting: Internal Medicine

## 2022-05-26 ENCOUNTER — Other Ambulatory Visit: Payer: Self-pay | Admitting: *Deleted

## 2022-05-26 DIAGNOSIS — Z170 Estrogen receptor positive status [ER+]: Secondary | ICD-10-CM

## 2022-05-26 MED ORDER — ANASTROZOLE 1 MG PO TABS: 1.0000 mg | ORAL_TABLET | Freq: Every day | ORAL | 4 refills | Status: DC

## 2022-05-26 NOTE — Telephone Encounter (Signed)
Patient called. Requested refill of Anastrozole sent to [facility]. Refill sent per Dr. Rob Hickman OV note 09/15/21

## 2022-06-01 ENCOUNTER — Ambulatory Visit: Payer: POS | Admitting: Endocrinology

## 2022-08-12 ENCOUNTER — Ambulatory Visit
Admission: RE | Admit: 2022-08-12 | Discharge: 2022-08-12 | Disposition: A | Discharge status: Home / Self Care | Payer: Medicare Other | Source: Ambulatory Visit | Attending: Hematology and Oncology | Admitting: Hematology and Oncology

## 2022-08-12 DIAGNOSIS — D0512 Intraductal carcinoma in situ of left breast: Secondary | ICD-10-CM

## 2022-08-26 ENCOUNTER — Telehealth: Payer: Self-pay | Admitting: Hematology and Oncology

## 2022-08-26 NOTE — Telephone Encounter (Signed)
Reached out to patient to reschedule per [place] being out of office; patient aware of time and date change.

## 2022-09-06 ENCOUNTER — Other Ambulatory Visit: Payer: Self-pay | Admitting: Medical

## 2022-09-07 ENCOUNTER — Other Ambulatory Visit: Payer: Self-pay | Admitting: *Deleted

## 2022-09-07 DIAGNOSIS — Z170 Estrogen receptor positive status [ER+]: Secondary | ICD-10-CM

## 2022-09-07 MED ORDER — ANASTROZOLE 1 MG PO TABS: 1.0000 mg | ORAL_TABLET | Freq: Every day | ORAL | 0 refills | Status: DC

## 2022-09-07 NOTE — Telephone Encounter (Signed)
Left message for pt to call back to schedule a visit as its been over a year

## 2022-09-16 ENCOUNTER — Ambulatory Visit: Payer: POS | Admitting: Hematology and Oncology

## 2022-09-23 ENCOUNTER — Inpatient Hospital Stay: Payer: Medicare Other | Attending: Hematology and Oncology | Admitting: Hematology and Oncology

## 2022-09-23 VITALS — BP 139/73 | HR 89 | Temp 98.1°F | Resp 16 | Ht 70.0 in | Wt 242.9 lb

## 2022-09-23 DIAGNOSIS — D0512 Intraductal carcinoma in situ of left breast: Secondary | ICD-10-CM | POA: Insufficient documentation

## 2022-09-23 DIAGNOSIS — Z90710 Acquired absence of both cervix and uterus: Secondary | ICD-10-CM | POA: Insufficient documentation

## 2022-09-23 DIAGNOSIS — Z90722 Acquired absence of ovaries, bilateral: Secondary | ICD-10-CM | POA: Diagnosis not present

## 2022-09-23 DIAGNOSIS — Z79811 Long term (current) use of aromatase inhibitors: Secondary | ICD-10-CM | POA: Insufficient documentation

## 2022-09-23 DIAGNOSIS — Z79899 Other long term (current) drug therapy: Secondary | ICD-10-CM | POA: Diagnosis not present

## 2022-09-23 DIAGNOSIS — Z818 Family history of other mental and behavioral disorders: Secondary | ICD-10-CM | POA: Insufficient documentation

## 2022-09-23 DIAGNOSIS — Z8249 Family history of ischemic heart disease and other diseases of the circulatory system: Secondary | ICD-10-CM | POA: Insufficient documentation

## 2022-09-23 DIAGNOSIS — Z86010 Personal history of colonic polyps: Secondary | ICD-10-CM | POA: Insufficient documentation

## 2022-09-23 DIAGNOSIS — Z87891 Personal history of nicotine dependence: Secondary | ICD-10-CM | POA: Diagnosis not present

## 2022-09-23 DIAGNOSIS — Z87440 Personal history of urinary (tract) infections: Secondary | ICD-10-CM | POA: Diagnosis not present

## 2022-09-23 DIAGNOSIS — Z825 Family history of asthma and other chronic lower respiratory diseases: Secondary | ICD-10-CM | POA: Diagnosis not present

## 2022-09-23 DIAGNOSIS — Z8379 Family history of other diseases of the digestive system: Secondary | ICD-10-CM | POA: Insufficient documentation

## 2022-09-23 DIAGNOSIS — R232 Flushing: Secondary | ICD-10-CM | POA: Diagnosis not present

## 2022-09-23 DIAGNOSIS — Z8616 Personal history of COVID-19: Secondary | ICD-10-CM | POA: Diagnosis not present

## 2022-09-23 DIAGNOSIS — Z8719 Personal history of other diseases of the digestive system: Secondary | ICD-10-CM | POA: Diagnosis not present

## 2022-09-23 DIAGNOSIS — M2550 Pain in unspecified joint: Secondary | ICD-10-CM | POA: Diagnosis not present

## 2022-09-23 DIAGNOSIS — Z170 Estrogen receptor positive status [ER+]: Secondary | ICD-10-CM | POA: Insufficient documentation

## 2022-09-23 DIAGNOSIS — Z923 Personal history of irradiation: Secondary | ICD-10-CM | POA: Diagnosis not present

## 2022-09-23 NOTE — Progress Notes (Signed)
[facility]  [phone] [phone]     ID: Katie Robinson DOB: 03-28-1956  MR#: [id number]  IEP#:[id number]  Patient Care Team: Genia Del as PCP - General (Family Medicine) Magrinat, Valentino Hue, MD (Inactive) as Consulting Physician (Oncology) Iva Boop, MD as Consulting Physician (Gastroenterology) Lonie Peak, MD as Attending Physician (Radiation Oncology) Emelia Loron, MD as Consulting Physician (General Surgery) Axel Filler, Larna Daughters, NP as Nurse Practitioner (Hematology and Oncology)  CHIEF COMPLAINT: DCIS estrogen receptor positive  CURRENT TREATMENT: Anastrozole   INTERVAL HISTORY: Katie Robinson returns today for follow-up of her noninvasive breast cancer. She is excited to be done with 5 yrs of anastrozole. Last mammogram Feb 24, neg for malignancy. She also underwent bone density screening on 06/20/2020 showing a T-score of -0.6, which is considered normal. I didn't order a repeat bone density. Patient is doing well on anastrozole except for some hot flashes and arthralgias which are overall tolerable. She is hopeful that the arthralgias will improve with stopping anastrozole  REVIEW OF SYSTEMS: ROS reviewed and neg.   COVID 19 VACCINATION STATUS: fully vaccinated [employer]), with booster 03/2020; also had COVID-19 infection January 2022.     HISTORY OF CURRENT ILLNESS: From the original intake note:  Katie Robinson had routine screening mammography on 06/07/2017 showing a possible abnormality in the left breast. She underwent unilateral left breast diagnostic mammography with tomography and left axilla ultrasonography on 06/17/2017 at [facility] on 06/10/2017 showing: Suspicious, grouped left breast calcifications. Negative for left axillary lymphadenopathy.  Accordingly on 06/14/2017 she proceeded to biopsy of the left breast area in question. 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 Focus Highly suspicious for stromal invasion. Prognostic indicators significant for: estrogen receptor, 50% positive with moderate staining intensity and progesterone receptor, 0% negative.  The patient's subsequent history is as detailed below.   PAST MEDICAL HISTORY: Past Medical History:  Diagnosis Date   Allergy    SEASONAL   Anemia    Arthralgia    Asthma    controlled   Breast cancer, left breast (HCC) 05/2017   left breast cancer   Chronic headache    many years   COVID-19 virus infection 06/2020   Family history of premature coronary artery disease    father   Former smoker    10 pack year history   GERD (gastroesophageal reflux disease)    mild, occasional   H/O bone density study 11/2015   normal   H/O cardiovascular stress test 2009   History of radiation therapy 08/12/17- 09/08/17   Left Breast 40.05 Gy in 15 fractions, Left Breast Boost/ 10 Gy in 5 fractions.    Hx of adenomatous and sessile serrated colonic polyps 05/24/2014   Iron deficiency 11/29/2015   Obesity    Pectus excavatum    mild, left asymmetric   Personal history of radiation therapy    S/P hysterectomy    due to fibroids   Seasonal allergic rhinitis    UC (ulcerative colitis) (HCC) 2015   Dr. Leone Payor   Urinary tract infection 2013   UTI due to extended-spectrum beta lactamase (ESBL) producing Escherichia coli 09/20/2017   Vitamin D deficiency 07/18/2014   Wears glasses     PAST SURGICAL HISTORY: Past Surgical History:  Procedure Laterality Date   BREAST LUMPECTOMY Left 06/2017   BREAST LUMPECTOMY WITH RADIOACTIVE SEED AND SENTINEL LYMPH NODE BIOPSY  Left 06/29/2017   Procedure: LEFT BREAST LUMPECTOMY WITH RADIOACTIVE SEED AND LEFT AXILLARY SENTINEL LYMPH NODE BIOPSY ERAS PATHWAY;  Surgeon: Emelia LoronWakefield, Matthew, MD;  Location: [facility];  Service: General;  Laterality: Left;   COLONOSCOPY   02/2016   polyps; Dr. Stan Headarl Gessner   EVACUATION BREAST HEMATOMA Left 07/11/2017   Procedure: EVACUATION HEMATOMA BREAST;  Surgeon: Romie Leveehomas, Alicia, MD;  Location: [facility];  Service: General;  Laterality: Left;   FOOT TENDON SURGERY Left 2013   NAVEL REMOVED     AS A CHILD   OTHER SURGICAL HISTORY     biopsy for possible sarcoidosis   POLYPECTOMY     SKIN BIOPSY     TONSILLECTOMY     TOTAL ABDOMINAL HYSTERECTOMY  2002   total; Dr. Richarda Overlieichard Holland   WISDOM TOOTH EXTRACTION      FAMILY HISTORY Family History  Problem Relation Age of Onset   Asthma Father    Heart disease Father [id number]       died of MI mid '40s   Hypertension Sister    Asthma Sister    Hypertension Mother    Aneurysm Mother        brain   Alzheimer's disease Mother    Heart disease Mother    Cancer Neg Hx    Stroke Neg Hx    Diabetes Neg Hx    Colon cancer Neg Hx    Colon polyps Neg Hx    Crohn's disease Neg Hx    Ulcerative colitis Neg Hx    Hypercalcemia Neg Hx   Mother passed at due to brain aneurysm and she also had Alzhimer's. Father passed at 8855 due to heart attack and asthma. She has 1 living sister with good health. No history with breast or ovarian cancer in the family.   GYNECOLOGIC HISTORY:  No LMP recorded. Patient has had a hysterectomy. Menarche: 67 years old Age at first live birth: 67 years old GP: GxP2 HRT: Did not use hormone replacement She has a total hysterectomy with bilateral salpingo-oophorectomy in 2002 due to fibroids and a chocolate cyst on her ovaries.   SOCIAL HISTORY:  She works as a Research scientist (physical sciences)postal clerk. She is divorced. Her son lives with her and is unmarried. He works at [employer] as a Clinical biochemistCMA. Her daughter is a Theatre stage managerrestaurant assisant manager at [employer].  The patient has 1 grandchild. She belongs to [employer].    ADVANCED DIRECTIVES: Not in place; at the 06/23/2017 visit the patient was given the appropriate documents to complete on notarized at her  discretion   HEALTH MAINTENANCE: Social History   Tobacco Use   Smoking status: Former    Types: Cigarettes    Quit date: 06/15/1988    Years since quitting: 34.2   Smokeless tobacco: Never  Vaping Use   Vaping Use: Never used  Substance Use Topics   Alcohol use: Yes    Alcohol/week: 0.0 standard drinks of alcohol    Comment: rarely   Drug use: No     Colonoscopy: 11/25/2017/ Gessner/ normal  PAP: Status post Hysterectomy/ BSO 2002  Bone density: 12/02/2015 T-score +0.2 normal   No Known Allergies  Current Outpatient Medications  Medication Sig Dispense Refill   albuterol (VENTOLIN HFA) 108 (90 Base) MCG/ACT inhaler TAKE 2 PUFFS BY MOUTH EVERY 6 HOURS AS NEEDED FOR WHEEZE OR SHORTNESS OF BREATH 60 g 0   amLODipine (NORVASC) 5 MG tablet Take 1 tablet (5 mg total) by mouth daily. 90  tablet 3   amLODipine (NORVASC) 5 MG tablet TAKE 1 TABLET (5 MG TOTAL) BY MOUTH DAILY. 90 tablet 1   anastrozole (ARIMIDEX) 1 MG tablet Take 1 tablet (1 mg total) by mouth daily. 30 tablet 0   Ascorbic Acid (VITAMIN C) 100 MG tablet Take 100 mg by mouth daily. (Patient not taking: Reported on 07/18/2021)     cetirizine (ZYRTEC) 10 MG tablet TAKE 1 TABLET BY MOUTH EVERYDAY AT BEDTIME 90 tablet 1   Cholecalciferol (VITAMIN D) 50 MCG (2000 UT) CAPS Take 1 capsule (2,000 Units total) by mouth daily. 90 capsule 3   diphenoxylate-atropine (LOMOTIL) 2.5-0.025 MG tablet 1-2 every 6 hours prn diarrhea 60 tablet 1   HUMIRA 40 MG/0.4ML PSKT Inject into skin subcutaneous once every 2 weeks. 2 each 5   omeprazole (PRILOSEC) 40 MG capsule Take 1 capsule (40 mg total) by mouth daily. 90 capsule 3   zinc gluconate 50 MG tablet Take 50 mg by mouth daily.     No current facility-administered medications for this visit.    OBJECTIVE: Middle-aged African-American woman in no acute distress  There were no vitals filed for this visit.     There is no height or weight on file to calculate BMI.   Wt Readings from Last  3 Encounters:  09/15/21 239 lb 14.4 oz (108.8 kg)  07/18/21 234 lb 9.6 oz (106.4 kg)  07/11/21 235 lb 8 oz (106.8 kg)      ECOG FS:1 - Symptomatic but completely ambulatory  Physical Exam Constitutional:      Appearance: Normal appearance.  Cardiovascular:     Rate and Rhythm: Normal rate and regular rhythm.     Pulses: Normal pulses.     Heart sounds: Normal heart sounds.  Pulmonary:     Effort: Pulmonary effort is normal.     Breath sounds: Normal breath sounds.  Chest:     Comments: Bilateral breasts inspected.  No palpable masses or regional adenopathy Abdominal:     General: Abdomen is flat. Bowel sounds are normal.     Palpations: Abdomen is soft.  Musculoskeletal:        General: Normal range of motion.     Cervical back: Normal range of motion and neck supple. No rigidity.  Lymphadenopathy:     Cervical: No cervical adenopathy.  Skin:    General: Skin is warm and dry.  Neurological:     General: No focal deficit present.     Mental Status: She is alert.    LAB RESULTS:  CMP     Component Value Date/Time   NA 139 09/15/2021 0846   NA 141 05/11/2018 1445   K 3.7 09/15/2021 0846   CL 107 09/15/2021 0846   CO2 25 09/15/2021 0846   GLUCOSE 96 09/15/2021 0846   BUN 15 09/15/2021 0846   BUN 8 05/11/2018 1445   CREATININE 0.69 09/15/2021 0846   CREATININE 0.59 05/10/2017 0942   CALCIUM 10.5 (H) 09/15/2021 0846   PROT 7.5 09/15/2021 0846   PROT 6.9 05/18/2018 1430   ALBUMIN 4.1 09/15/2021 0846   ALBUMIN 4.3 05/11/2018 1445   AST 13 (L) 09/15/2021 0846   ALT 9 09/15/2021 0846   ALKPHOS 149 (H) 09/15/2021 0846   BILITOT 0.3 09/15/2021 0846   GFRNONAA >60 09/15/2021 0846   GFRAA 105 05/11/2018 1445    Lab Results  Component Value Date   ALBUMINELP 4.2 05/28/2020   A1GS 0.4 (H) 05/28/2020   A2GS 0.8 05/28/2020   BETS  0.6 05/28/2020   BETA2SER 0.4 05/28/2020   GAMS 1.2 05/28/2020   MSPIKE Not Observed 05/18/2018   SPEI  05/28/2020     Comment:      . Alpha-1 globulin increase noted. .     No results found for: "KPAFRELGTCHN", "LAMBDASER", "KAPLAMBRATIO"  Lab Results  Component Value Date   WBC 9.7 09/15/2021   NEUTROABS 4.5 09/15/2021   HGB 12.9 09/15/2021   HCT 40.4 09/15/2021   MCV 85.2 09/15/2021   PLT 280 09/15/2021   No results found for: "LABCA2"  No components found for: "ZOXWRU045"  No results for input(s): "INR" in the last 168 hours.  No results found for: "LABCA2"  No results found for: "WUJ811"  No results found for: "CAN125"  No results found for: "CAN153"  No results found for: "CA2729"  No components found for: "HGQUANT"  No results found for: "CEA1", "CEA" / No results found for: "CEA1", "CEA"   No results found for: "AFPTUMOR"  No results found for: "CHROMOGRNA"  No results found for: "HGBA", "HGBA2QUANT", "HGBFQUANT", "HGBSQUAN" (Hemoglobinopathy evaluation)   No results found for: "LDH"  Lab Results  Component Value Date   IRON 45 04/05/2016   TIBC 141 (L) 04/05/2016   IRONPCTSAT 32 (H) 04/05/2016   (Iron and TIBC)  Lab Results  Component Value Date   FERRITIN 68 04/05/2016    Urinalysis    Component Value Date/Time   COLORURINE YELLOW 09/16/2017 1220   APPEARANCEUR CLEAR 09/16/2017 1220   LABSPEC 1.010 05/11/2018 1402   PHURINE 6.5 09/16/2017 1220   GLUCOSEU NEGATIVE 09/16/2017 1220   HGBUR TRACE-INTACT (A) 09/16/2017 1220   BILIRUBINUR negative 04/30/2021 1209   BILIRUBINUR n 04/29/2015 1218   KETONESUR negative 04/30/2021 1209   KETONESUR NEGATIVE 09/16/2017 1220   PROTEINUR negative 05/11/2018 1402   PROTEINUR NEGATIVE 04/04/2016 1552   UROBILINOGEN 0.2 04/30/2021 1209   UROBILINOGEN 0.2 09/16/2017 1220   NITRITE Negative 04/30/2021 1209   NITRITE NEGATIVE 09/16/2017 1220   LEUKOCYTESUR Large (3+) (A) 04/30/2021 1209    STUDIES: No results found.   ELIGIBLE FOR AVAILABLE RESEARCH PROTOCOL: No   ASSESSMENT: 67 y.o. Yoe woman status post left  breast lower inner quadrant biopsy 06/14/2017 for ductal carcinoma in situ, grade 3, with an area suspicious for microinvasion, estrogen receptor positive progesterone receptor negative  (1) status post left lumpectomy and sentinel lymph node sampling 06/29/2017 showing no residual carcinoma, with clear margins  (a) a total of 5 sentinel lymph nodes were removed  (2) adjuvant radiation 08/12/17 - 09/08/17  Site/dose:    1) Left Breast / 40.05 Gy in 15 fractions 2) Left Breast Boost / 10 Gy in 5 fractions  (3) anastrozole started April 2019  (a) bone density 12/27/2017 normal with a T score of -0.9  (b) bone density 06/20/2020 shows a T score of -0.6.   PLAN: Patient is tolerating anastrozole well.  She has now completed 5 years of anastrozole.  Given her DCIS, I do not believe there is any role for extended antiestrogen therapy. No concerns for recurrence on exam today.  Last mammogram results reviewed, BI-RADS 2, no evidence of malignancy.  Last bone density in 2022 with normal bone density.   We have discussed about continuing vitamin D supplementation and weightbearing exercises. At this time since she has completed treatment, I have offered her to return to PCP for surveillance versus continued surveillance and survivorship clinic.  She is willing to continue surveillance with her PCP.  She was  recommended annual breast exam by her PCP and screening mammogram bilaterally.  She expressed understanding of the recommendations.  I congratulated her on completing treatment.  She can return to clinic with [place] as needed.  She will return to clinic in 1 year or sooner as needed. Total time spent: 30 minutes, she is a new patient to me, transitioning from Dr. Darnelle Catalan upon his retirement.  *Total Encounter Time as defined by the Centers for Medicare and Medicaid Services includes, in addition to the face-to-face time of a patient visit (documented in the note above) non-face-to-face time:  obtaining and reviewing outside history, ordering and reviewing medications, tests or procedures, care coordination (communications with other health care professionals or caregivers) and documentation in the medical record.

## 2022-09-29 ENCOUNTER — Other Ambulatory Visit: Payer: Self-pay | Admitting: Hematology and Oncology

## 2022-09-29 DIAGNOSIS — Z170 Estrogen receptor positive status [ER+]: Secondary | ICD-10-CM

## 2022-10-22 ENCOUNTER — Telehealth: Payer: Self-pay | Admitting: Internal Medicine

## 2022-10-22 NOTE — Telephone Encounter (Signed)
Patient not seen since January 2023.  I am unwilling to refill Humira at this point she needs an office visit with me versus an app.  We can find an overbook spot or a banding spot and use that if needed.  Contact her and explain.

## 2022-10-22 NOTE — Telephone Encounter (Signed)
Appointment set up for 10/28/2022. She has been out of her Humira 7 weeks now. We will work on the paperwork after her office visit.

## 2022-10-28 ENCOUNTER — Other Ambulatory Visit: Payer: Self-pay | Admitting: Internal Medicine

## 2022-10-28 ENCOUNTER — Ambulatory Visit (INDEPENDENT_AMBULATORY_CARE_PROVIDER_SITE_OTHER): Payer: Medicare Other | Admitting: Internal Medicine

## 2022-10-28 ENCOUNTER — Other Ambulatory Visit (INDEPENDENT_AMBULATORY_CARE_PROVIDER_SITE_OTHER): Payer: Medicare Other

## 2022-10-28 ENCOUNTER — Encounter: Payer: Self-pay | Admitting: Internal Medicine

## 2022-10-28 VITALS — BP 144/88 | HR 88 | Ht 70.0 in | Wt 242.8 lb

## 2022-10-28 DIAGNOSIS — Z86010 Personal history of colonic polyps: Secondary | ICD-10-CM | POA: Diagnosis not present

## 2022-10-28 DIAGNOSIS — Z7960 Long term (current) use of unspecified immunomodulators and immunosuppressants: Secondary | ICD-10-CM

## 2022-10-28 DIAGNOSIS — K5150 Left sided colitis without complications: Secondary | ICD-10-CM | POA: Diagnosis not present

## 2022-10-28 DIAGNOSIS — J392 Other diseases of pharynx: Secondary | ICD-10-CM

## 2022-10-28 DIAGNOSIS — Z860101 Personal history of adenomatous and serrated colon polyps: Secondary | ICD-10-CM

## 2022-10-28 LAB — CBC WITH DIFFERENTIAL/PLATELET
Basophils Absolute: 0.1 10*3/uL (ref 0.0–0.1)
Basophils Relative: 0.7 % (ref 0.0–3.0)
Eosinophils Absolute: 0.2 10*3/uL (ref 0.0–0.7)
Eosinophils Relative: 2.1 % (ref 0.0–5.0)
HCT: 37.8 % (ref 36.0–46.0)
Hemoglobin: 11.9 g/dL — ABNORMAL LOW (ref 12.0–15.0)
Lymphocytes Relative: 40.8 % (ref 12.0–46.0)
Lymphs Abs: 4.2 10*3/uL — ABNORMAL HIGH (ref 0.7–4.0)
MCHC: 31.5 g/dL (ref 30.0–36.0)
MCV: 73.3 fl — ABNORMAL LOW (ref 78.0–100.0)
Monocytes Absolute: 0.7 10*3/uL (ref 0.1–1.0)
Monocytes Relative: 6.8 % (ref 3.0–12.0)
Neutro Abs: 5.1 10*3/uL (ref 1.4–7.7)
Neutrophils Relative %: 49.6 % (ref 43.0–77.0)
Platelets: 344 10*3/uL (ref 150.0–400.0)
RBC: 5.15 Mil/uL — ABNORMAL HIGH (ref 3.87–5.11)
RDW: 18.7 % — ABNORMAL HIGH (ref 11.5–15.5)
WBC: 10.3 10*3/uL (ref 4.0–10.5)

## 2022-10-28 LAB — COMPREHENSIVE METABOLIC PANEL
ALT: 9 U/L (ref 0–35)
AST: 14 U/L (ref 0–37)
Albumin: 4.1 g/dL (ref 3.5–5.2)
Alkaline Phosphatase: 142 U/L — ABNORMAL HIGH (ref 39–117)
BUN: 11 mg/dL (ref 6–23)
CO2: 27 mEq/L (ref 19–32)
Calcium: 10.5 mg/dL (ref 8.4–10.5)
Chloride: 104 mEq/L (ref 96–112)
Creatinine, Ser: 0.7 mg/dL (ref 0.40–1.20)
GFR: 90.54 mL/min (ref >60.00)
Glucose, Bld: 82 mg/dL (ref 70–99)
Potassium: 3.8 mEq/L (ref 3.5–5.1)
Sodium: 138 mEq/L (ref 135–145)
Total Bilirubin: 0.3 mg/dL (ref 0.2–1.2)
Total Protein: 7.6 g/dL (ref 6.0–8.3)

## 2022-10-28 MED ORDER — HUMIRA (2 SYRINGE) 40 MG/0.4ML ~~LOC~~ PSKT
PREFILLED_SYRINGE | SUBCUTANEOUS | 5 refills | Status: DC
Start: 2022-10-28 — End: 2023-03-08

## 2022-10-28 MED ORDER — FERROUS SULFATE 325 (65 FE) MG PO TABS: 325.0000 mg | ORAL_TABLET | Freq: Every day | ORAL | 3 refills | Status: DC

## 2022-10-28 NOTE — Progress Notes (Signed)
Katie Robinson 67 y.o. [birth year] [id number]  Assessment & Plan:   Encounter Diagnoses  Name Primary?   Left sided ulcerative (chronic) colitis (HCC) Yes   Long-term use of immunosuppressant medication    Hx of adenomatous and sessile serrated colonic polyps    Throat irritation    Orders Placed This Encounter  Procedures   CBC with Differential/Platelet   Comprehensive metabolic panel   QuantiFERON-TB Gold Plus   Labs as above.  Refill Humira to try to understand what the issue is perhaps she needs a bio similar.  She was not really able to tell me more than that the medicine stopped coming and that she was advised to fill out financial assistance forms.  Those came from her company however.  Prescription sent to [facility] which I think is her specialty pharmacy at this time.  Once we get a response we will determine the next steps.  Change her colonoscopy recall to later this year, it was to be in June but I want her on medication for a while before I repeat a colonoscopy.  Further plans pending the above.  She is advised to discuss her throat symptoms with primary care and consider returning to ENT.  These do not sound esophageal in origin and she did not respond to PPI therapy in the past either so I do not think there is any significant utility in repeating that or doing an EGD.   Regarding her concern about her weight I advised a good starting point is to avoid getting any calories through liquid beverages i.e. eliminate sweet tea, and also try to reduce starchy vegetables and breads etc.   Subjective:   Chief Complaint: Ulcerative colitis and medication management  HPI 67 year old African-American woman on biweekly Humira for left-sided ulcerative colitis, with endoscopic and pathologic remission 2019 colonoscopy who has a history of colon polyps as well -last seen in Robinson 2023 and was having some flaring symptoms.  I have not heard from her since  then until she requested a refill on her Humira.  I had her come into the office.  She changed over to Medicare advantage and is now on [facility] Medicare and sometime around the beginning of this year or thereafter her Humira stopped coming and she said she called [employer] and they said they would send it.  What we have instead are patient assistance forms.  So she has been off her medicine for about a week she says she is asymptomatic i.e. no abdominal pain diarrhea or rectal bleeding.  She is also had issues with a globus or throat irritation.  She had seen Dr. Jenne Pane of ENT and spoken to me about it in 2019.  Send intermittent problem where she gets either itchy or a pressure or a slightly sore throat she may cough a little bit.  It has been going on for years that has not responded to PPI therapy.  She stopped the PPI therapy.  She does not seem to have postnasal drip or clear-cut allergy problems.  Wt Readings from Last 3 Encounters:  10/28/22 242 lb 12.8 oz (110.1 kg)  09/23/22 242 lb 14.4 oz (110.2 kg)  09/15/21 239 lb 14.4 oz (108.8 kg)   She says she is exercising since retiring but is gaining weight and is concerned.  She does drink sweet tea and eats starches and bread etc.  No Known Allergies Current Meds  Medication Sig   albuterol (VENTOLIN HFA) 108 (90 Base) MCG/ACT  inhaler TAKE 2 PUFFS BY MOUTH EVERY 6 HOURS AS NEEDED FOR WHEEZE OR SHORTNESS OF BREATH   amLODipine (NORVASC) 5 MG tablet TAKE 1 TABLET (5 MG TOTAL) BY MOUTH DAILY.   Ascorbic Acid (VITAMIN C) 100 MG tablet Take 100 mg by mouth daily.   Cholecalciferol (VITAMIN D) 50 MCG (2000 UT) CAPS Take 1 capsule (2,000 Units total) by mouth daily.   diphenoxylate-atropine (LOMOTIL) 2.5-0.025 MG tablet 1-2 every 6 hours prn diarrhea   Glucos-Chond-Hyal Ac-Ca Fructo (MOVE FREE JOINT HEALTH ADVANCE) TABS Take 1 tablet by mouth daily at 6 (six) AM.   Multiple Vitamins-Iron (MULTIVITAMINS WITH IRON) TABS tablet Take 1 tablet by mouth daily.    vitamin B-12 (CYANOCOBALAMIN) 100 MCG tablet Take 100 mcg by mouth daily.   [DISCONTINUED] HUMIRA 40 MG/0.4ML PSKT Inject into skin subcutaneous once every 2 weeks.   Past Medical History:  Diagnosis Date   Allergy    SEASONAL   Anemia    Arthralgia    Asthma    controlled   Breast cancer, left breast (HCC) 05/2017   left breast cancer   Chronic headache    many years   COVID-19 virus infection 06/2020   Family history of premature coronary artery disease    father   Former smoker    10 pack year history   GERD (gastroesophageal reflux disease)    mild, occasional   H/O bone density study 11/2015   normal   H/O cardiovascular stress test 2009   History of radiation therapy 08/12/17- 09/08/17   Left Breast 40.05 Gy in 15 fractions, Left Breast Boost/ 10 Gy in 5 fractions.    Hx of adenomatous and sessile serrated colonic polyps 05/24/2014   Iron deficiency 11/29/2015   Obesity    Pectus excavatum    mild, left asymmetric   Personal history of radiation therapy    S/P hysterectomy    due to fibroids   Seasonal allergic rhinitis    UC (ulcerative colitis) (HCC) 2015   Dr. Leone Payor   Urinary tract infection 2013   UTI due to extended-spectrum beta lactamase (ESBL) producing Escherichia coli 09/20/2017   Vitamin D deficiency 07/18/2014   Wears glasses    Past Surgical History:  Procedure Laterality Date   BREAST LUMPECTOMY Left 06/2017   BREAST LUMPECTOMY WITH RADIOACTIVE SEED AND SENTINEL LYMPH NODE BIOPSY Left 06/29/2017   Procedure: LEFT BREAST LUMPECTOMY WITH RADIOACTIVE SEED AND LEFT AXILLARY SENTINEL LYMPH NODE BIOPSY ERAS PATHWAY;  Surgeon: Emelia Loron, MD;  Location: [facility];  Service: General;  Laterality: Left;   COLONOSCOPY  02/2016   polyps; Dr. Stan Head   EVACUATION BREAST HEMATOMA Left 07/11/2017   Procedure: EVACUATION HEMATOMA BREAST;  Surgeon: Romie Levee, MD;  Location: [facility];  Service: General;  Laterality: Left;   FOOT TENDON  SURGERY Left 2013   NAVEL REMOVED     AS A CHILD   OTHER SURGICAL HISTORY     biopsy for possible sarcoidosis   POLYPECTOMY     SKIN BIOPSY     TONSILLECTOMY     TOTAL ABDOMINAL HYSTERECTOMY  2002   total; Dr. Richarda Overlie   WISDOM TOOTH EXTRACTION     Social History   Social History Narrative   Lives with her son, has 2 adult children, not married,       Retired from the post office   family history includes Alzheimer's disease in her mother; Aneurysm in her mother; Asthma in her father and sister;  Heart disease in her mother; Heart disease (age of onset: 27) in her father; Hypertension in her mother and sister.   Review of Systems  As above Objective:   Physical Exam @BP  (!) 144/88   Pulse 88   Ht 5\' 10"  (1.778 m)   Wt 242 lb 12.8 oz (110.1 kg)   BMI 34.84 kg/m @  General:  NAD Eyes:   anicteric Lungs:  clear Heart::  S1S2 no rubs, murmurs or gallops Abdomen:  soft and nontender, BS+ Ext:   no edema, cyanosis or clubbing    Data Reviewed:  As per HPI

## 2022-10-28 NOTE — Patient Instructions (Signed)
Your provider has requested that you go to the basement level for lab work before leaving today. Press "B" on the elevator. The lab is located at the first door on the left as you exit the elevator.  Follow up with your PCP about your throat issues.   Dr Leone Payor has sent in your Humira to the [facility].   I appreciate the opportunity to care for you. Stan Head, MD, [facility]

## 2022-10-30 NOTE — Telephone Encounter (Signed)
I called Katie Robinson and told her we will send the [employer] paperwork in for her Humira once Dr Leone Payor signs the paperwork.

## 2022-10-30 NOTE — Telephone Encounter (Signed)
[facility] calling to see if paperwork was still going to be filled out and sent to [employer] states they need to know if they will be needing to do PA for medication or not. Please advise, said to call the patient

## 2022-10-31 LAB — QUANTIFERON-TB GOLD PLUS
Mitogen-NIL: 8.03 IU/mL
NIL: 0.07 IU/mL
QuantiFERON-TB Gold Plus: NEGATIVE
TB1-NIL: 0.03 IU/mL
TB2-NIL: 0.06 IU/mL

## 2022-11-02 ENCOUNTER — Other Ambulatory Visit (HOSPITAL_COMMUNITY): Payer: Self-pay

## 2022-11-02 ENCOUNTER — Telehealth: Payer: Self-pay | Admitting: Internal Medicine

## 2022-11-02 ENCOUNTER — Telehealth: Payer: Self-pay | Admitting: Pharmacy Technician

## 2022-11-02 ENCOUNTER — Encounter: Payer: Self-pay | Admitting: Medical

## 2022-11-02 ENCOUNTER — Ambulatory Visit (INDEPENDENT_AMBULATORY_CARE_PROVIDER_SITE_OTHER): Payer: Medicare Other | Admitting: Medical

## 2022-11-02 VITALS — BP 118/68 | HR 92 | Ht 69.5 in | Wt 238.0 lb

## 2022-11-02 DIAGNOSIS — Z136 Encounter for screening for cardiovascular disorders: Secondary | ICD-10-CM

## 2022-11-02 DIAGNOSIS — I10 Essential (primary) hypertension: Secondary | ICD-10-CM | POA: Diagnosis not present

## 2022-11-02 DIAGNOSIS — E559 Vitamin D deficiency, unspecified: Secondary | ICD-10-CM

## 2022-11-02 DIAGNOSIS — Z7189 Other specified counseling: Secondary | ICD-10-CM

## 2022-11-02 DIAGNOSIS — Z853 Personal history of malignant neoplasm of breast: Secondary | ICD-10-CM

## 2022-11-02 DIAGNOSIS — Z23 Encounter for immunization: Secondary | ICD-10-CM | POA: Diagnosis not present

## 2022-11-02 DIAGNOSIS — E611 Iron deficiency: Secondary | ICD-10-CM | POA: Insufficient documentation

## 2022-11-02 DIAGNOSIS — Z0000 Encounter for general adult medical examination without abnormal findings: Secondary | ICD-10-CM | POA: Diagnosis not present

## 2022-11-02 DIAGNOSIS — Z131 Encounter for screening for diabetes mellitus: Secondary | ICD-10-CM

## 2022-11-02 DIAGNOSIS — J4540 Moderate persistent asthma, uncomplicated: Secondary | ICD-10-CM

## 2022-11-02 MED ORDER — AMLODIPINE BESYLATE 5 MG PO TABS: 5.0000 mg | ORAL_TABLET | Freq: Every day | ORAL | 3 refills | Status: DC

## 2022-11-02 MED ORDER — VITAMIN D 50 MCG (2000 UT) PO CAPS: 1.0000 | ORAL_CAPSULE | Freq: Every day | ORAL | 3 refills | Status: DC

## 2022-11-02 NOTE — Telephone Encounter (Signed)
Patient Advocate Encounter  Prior Authorization for HUMIRA 40MG  has been approved with [employer].    PA# [id number] Effective dates: 5.20.24 through 8.12.24  Per [facility] test claim, copay for 28 days supply is $60   Received notification from [employer] that prior authorization for HUMIRA 40MG  is required.   PA submitted on 5.20.24 Key [id number] Status is pending

## 2022-11-02 NOTE — Addendum Note (Signed)
Addended by: Herminio Commons A on: 11/02/2022 03:58 PM   Modules accepted: Orders

## 2022-11-02 NOTE — Telephone Encounter (Signed)
PA has been submitted EXPEDITED, and telephone encounter has been created.  PA has been APPROVED and encounter has been updated.

## 2022-11-02 NOTE — Telephone Encounter (Signed)
Inbound call from [employer], states Humira requires prior authorization, Stated it could be done through Cover My Meds or verbally at  [phone].

## 2022-11-02 NOTE — Progress Notes (Signed)
Subjective: Chief Complaint  Patient presents with   Annual Exam    Fasting cpe, no concerns    Medical team: Dr. Ruthann Cancer, medical oncology Dr. Lonie Peak, radiation oncology Dr. Stan Head, GI Dr. Regino Schultze, [facility] Dr. Romero Belling, endocrinology Dr. Christia Reading, [facility] Eye doctor Dentist Maveryk Renstrom, Kermit Balo, PA-C for primary care   Concerns:  Just saw oncology recently, had some labs.  Was just started back on iron daily due to mild anemia.  She donates blood often, typically every 8 weeks.    Exercises 6 days per week, very active.   Sometimes feels a little less enereti  She will get updated colonoscopy later this year when back on [place].  Sees Dr. Leone Payor regularly for ulcerative colitis.    Diagnosed with breast cancer 05/2017, left breast, had lumpectomy and radiation therapy.     Asthma - usually albuterol occasionally.  Sometimes uses at bedtime.  No recent problems..  She is status post hysterectomy and bilateral salpingo-oophorectomy   Past Medical History:  Diagnosis Date   Allergy    SEASONAL   Anemia    Arthralgia    Asthma    controlled   Breast cancer, left breast (HCC) 05/2017   left breast cancer   Chronic headache    many years   COVID-19 virus infection 06/2020   Family history of premature coronary artery disease    father   Former smoker    10 pack year history   GERD (gastroesophageal reflux disease)    mild, occasional   H/O bone density study 11/2015   normal   H/O cardiovascular stress test 2009   History of radiation therapy 08/12/17- 09/08/17   Left Breast 40.05 Gy in 15 fractions, Left Breast Boost/ 10 Gy in 5 fractions.    Hx of adenomatous and sessile serrated colonic polyps 05/24/2014   Iron deficiency 11/29/2015   Obesity    Pectus excavatum    mild, left asymmetric   Personal history of radiation therapy    S/P hysterectomy    due to fibroids   Seasonal allergic rhinitis    UC (ulcerative  colitis) (HCC) 2015   Dr. Leone Payor   Urinary tract infection 2013   UTI due to extended-spectrum beta lactamase (ESBL) producing Escherichia coli 09/20/2017   Vitamin D deficiency 07/18/2014   Wears glasses     Past Surgical History:  Procedure Laterality Date   BREAST LUMPECTOMY Left 06/2017   BREAST LUMPECTOMY WITH RADIOACTIVE SEED AND SENTINEL LYMPH NODE BIOPSY Left 06/29/2017   Procedure: LEFT BREAST LUMPECTOMY WITH RADIOACTIVE SEED AND LEFT AXILLARY SENTINEL LYMPH NODE BIOPSY ERAS PATHWAY;  Surgeon: Emelia Loron, MD;  Location: [facility];  Service: General;  Laterality: Left;   COLONOSCOPY  02/2016   polyps; Dr. Stan Head   EVACUATION BREAST HEMATOMA Left 07/11/2017   Procedure: EVACUATION HEMATOMA BREAST;  Surgeon: Romie Levee, MD;  Location: [facility];  Service: General;  Laterality: Left;   FOOT TENDON SURGERY Left 2013   NAVEL REMOVED     AS A CHILD   OTHER SURGICAL HISTORY     biopsy for possible sarcoidosis   POLYPECTOMY     SKIN BIOPSY     TONSILLECTOMY     TOTAL ABDOMINAL HYSTERECTOMY  2002   total; Dr. Richarda Overlie   WISDOM TOOTH EXTRACTION       Family History  Problem Relation Age of Onset   Asthma Father    Heart disease Father 80  died of MI mid '40s   Hypertension Sister    Asthma Sister    Hypertension Mother    Aneurysm Mother        brain   Alzheimer's disease Mother    Heart disease Mother    Cancer Neg Hx    Stroke Neg Hx    Diabetes Neg Hx    Colon cancer Neg Hx    Colon polyps Neg Hx    Crohn's disease Neg Hx    Ulcerative colitis Neg Hx    Hypercalcemia Neg Hx      Current Outpatient Medications:    albuterol (VENTOLIN HFA) 108 (90 Base) MCG/ACT inhaler, TAKE 2 PUFFS BY MOUTH EVERY 6 HOURS AS NEEDED FOR WHEEZE OR SHORTNESS OF BREATH, Disp: 60 g, Rfl: 0   Ascorbic Acid (VITAMIN C) 100 MG tablet, Take 100 mg by mouth daily., Disp: , Rfl:    ferrous sulfate 325 (65 FE) MG tablet, Take 1 tablet (325 mg total) by  mouth daily with breakfast., Disp: , Rfl: 3   Glucos-Chond-Hyal Ac-Ca Fructo (MOVE FREE JOINT HEALTH ADVANCE) TABS, Take 1 tablet by mouth daily at 6 (six) AM., Disp: , Rfl:    Multiple Vitamins-Iron (MULTIVITAMINS WITH IRON) TABS tablet, Take 1 tablet by mouth daily., Disp: , Rfl:    vitamin B-12 (CYANOCOBALAMIN) 100 MCG tablet, Take 100 mcg by mouth daily., Disp: , Rfl:    amLODipine (NORVASC) 5 MG tablet, Take 1 tablet (5 mg total) by mouth daily., Disp: 90 tablet, Rfl: 3   Cholecalciferol (VITAMIN D) 50 MCG (2000 UT) CAPS, Take 1 capsule (2,000 Units total) by mouth daily., Disp: 90 capsule, Rfl: 3   diphenoxylate-atropine (LOMOTIL) 2.5-0.025 MG tablet, 1-2 every 6 hours prn diarrhea (Patient not taking: Reported on 11/02/2022), Disp: 60 tablet, Rfl: 1   HUMIRA, 2 SYRINGE, 40 MG/0.4ML PSKT, Inject into skin subcutaneous once every 2 weeks. (Patient not taking: Reported on 11/02/2022), Disp: 2 each, Rfl: 5  No Known Allergies    Objective: BP 118/68   Pulse 92   Ht 5' 9.5" (1.765 m)   Wt 238 lb (108 kg)   BMI 34.64 kg/m   Wt Readings from Last 3 Encounters:  11/02/22 238 lb (108 kg)  10/28/22 242 lb 12.8 oz (110.1 kg)  09/23/22 242 lb 14.4 oz (110.2 kg)   BP Readings from Last 3 Encounters:  11/02/22 118/68  10/28/22 (!) 144/88  09/23/22 139/73    General appearance: alert, no distress, WD/WN, AA female HEENT: normocephalic, sclerae anicteric, PERRLA, EOMi, nares patent, no discharge or erythema, pharynx normal Oral cavity: MMM, no lesions Neck: supple, no lymphadenopathy, no thyromegaly, no masses, no bruits Heart: RRR, normal S1, S2, no murmurs Lungs: CTA bilaterally, no wheezes, rhonchi, or rales Abdomen: +bs, soft, non tender, non distended, no masses, no hepatomegaly, no splenomegaly Back: non tender Musculoskeletal: nontender, no swelling, no obvious deformity Extremities: no edema, no cyanosis, no clubbing Pulses: 2+ symmetric, upper and lower extremities, normal cap  refill Neurological: alert, oriented x 3, CN2-12 intact, strength normal upper extremities and lower extremities, sensation normal throughout, DTRs 2+ throughout, no cerebellar signs, gait normal Psychiatric: normal affect, behavior normal, pleasant   Breast: deferred/declined Gyn/rectal - deferred/declined  EKG reviewed   Assessment: Encounter Diagnoses  Name Primary?   Routine general medical examination at a health care facility Yes   Essential hypertension    Vitamin D deficiency    Moderate persistent asthma without complication    History of breast cancer  Iron deficiency    Need for pneumococcal 20-valent conjugate vaccination    Screening for diabetes mellitus    Screening for heart disease    Advanced directives, counseling/discussion       Plan: Today you had a preventative care visit or wellness visit.    Topics today may have included healthy lifestyle, diet, exercise, preventative care, vaccinations, sick and well care, proper use of emergency dept and after hours care, as well as other concerns.     Recommendations: Continue to return yearly for your annual wellness and preventative care visits.  This gives [place] a chance to discuss healthy lifestyle, exercise, vaccinations, review your chart record, and perform screenings where appropriate.  I recommend you see your eye doctor yearly for routine vision care.  I recommend you see your dentist yearly for routine dental care including hygiene visits twice yearly.  See your specialists as usual   Vaccination recommendations were reviewed Immunization History  Administered Date(s) Administered   Influenza,inj,Quad PF,6+ Mos 05/30/2013, 04/22/2016, 05/10/2017, 04/21/2018, 04/30/2021   Influenza,inj,quad, With Preservative 03/19/2019   PFIZER(Purple Top)SARS-COV-2 Vaccination 08/18/2019, 09/18/2019, 03/26/2020   Pfizer Covid-19 Vaccine Bivalent Booster 23yrs & up 04/30/2021   Pneumococcal Conjugate-13 05/30/2013,  11/26/2015   Pneumococcal Polysaccharide-23 09/16/2017   Tdap 05/30/2013   Zoster Recombinat (Shingrix) 07/01/2018, 09/12/2018   I recommend a yearly flu shot  Counseled on the pneumococcal vaccine.  Vaccine information sheet given.  Pneumococcal vaccine Prevnar 20 given after consent obtained.   Screening for cancer: Breast cancer screening: You should perform a self breast exam monthly.   We reviewed recommendations for regular mammograms and breast cancer screening.  Get your mammogram yearly  Colon cancer screening:  I reviewed your colonoscopy on file that is up to date from 2017.   She will follow up with GI this year.  Cervical cancer screening: You are status post hysterectomy  Skin cancer screening: Check your skin regularly for new changes, growing lesions, or other lesions of concern Come in for evaluation if you have skin lesions of concern.  Lung cancer screening: If you have a greater than 30 pack year history of tobacco use, then you qualify for lung cancer screening with a chest CT scan  We currently don't have screenings for other cancers besides breast, cervical, colon, and lung cancers.  If you have a strong family history of cancer or have other cancer screening concerns, please let me know.    Bone health: Get at least 150 minutes of aerobic exercise weekly Get weight bearing exercise at least once weekly I reviewed the 06/2020 bone density test with normal result!  Heart health: Get at least 150 minutes of aerobic exercise weekly Limit alcohol It is important to maintain a healthy blood pressure and healthy cholesterol numbers   Advanced Directives: I recommend you consider completing a Health Care Power of Attorney and Living Will.   These documents respect your wishes and help alleviate burdens on your loved ones if you were to become terminally ill or be in a position to need those documents enforced.    You can complete Advanced Directives  yourself, have them notarized, then have copies made for our office, for you and for anybody you feel should have them in safe keeping.  Or, you can have an attorney prepare these documents.   If you haven't updated your Last Will and Testament in a while, it may be worthwhile having an attorney prepare these documents together and save on some costs.  Separate significant issues discussed: Hypertension-continue current medication  History of breast cancer-continue routine mammogram  History of ulcerative colitis-managed by gastroenterology  Vitamin D deficiency-continue supplement  Alkaline phosphatase elevated-managed by endocrinology  Asthma-continue albuterol as needed.      Janas was seen today for annual exam.  Diagnoses and all orders for this visit:  Routine general medical examination at a health care facility -     Lipid panel -     TSH -     Hemoglobin A1c -     Iron, TIBC and Ferritin Panel -     VITAMIN D 25 Hydroxy (Vit-D Deficiency, Fractures) -     EKG 12-Lead  Essential hypertension -     EKG 12-Lead  Vitamin D deficiency -     VITAMIN D 25 Hydroxy (Vit-D Deficiency, Fractures)  Moderate persistent asthma without complication  History of breast cancer  Iron deficiency -     Iron, TIBC and Ferritin Panel  Need for pneumococcal 20-valent conjugate vaccination  Screening for diabetes mellitus -     Hemoglobin A1c  Screening for heart disease -     EKG 12-Lead  Advanced directives, counseling/discussion  Other orders -     amLODipine (NORVASC) 5 MG tablet; Take 1 tablet (5 mg total) by mouth daily. -     Cholecalciferol (VITAMIN D) 50 MCG (2000 UT) CAPS; Take 1 capsule (2,000 Units total) by mouth daily.  Spent > 45 minutes face to face with patient in discussion of symptoms, evaluation, plan and recommendations.    F/u pending labs

## 2022-11-03 ENCOUNTER — Other Ambulatory Visit: Payer: Self-pay | Admitting: Medical

## 2022-11-03 LAB — LIPID PANEL
Chol/HDL Ratio: 2.7 ratio (ref 0.0–4.4)
Cholesterol, Total: 235 mg/dL — ABNORMAL HIGH (ref 100–199)
HDL: 87 mg/dL (ref >39)
LDL Chol Calc (NIH): 129 mg/dL — ABNORMAL HIGH (ref 0–99)
Triglycerides: 112 mg/dL (ref 0–149)
VLDL Cholesterol Cal: 19 mg/dL (ref 5–40)

## 2022-11-03 LAB — HEMOGLOBIN A1C
Est. average glucose Bld gHb Est-mCnc: 120 mg/dL
Hgb A1c MFr Bld: 5.8 % — ABNORMAL HIGH (ref 4.8–5.6)

## 2022-11-03 LAB — IRON,TIBC AND FERRITIN PANEL
Ferritin: 43 ng/mL (ref 15–150)
Iron Saturation: 31 % (ref 15–55)
Iron: 132 ug/dL (ref 27–139)
Total Iron Binding Capacity: 420 ug/dL (ref 250–450)
UIBC: 288 ug/dL (ref 118–369)

## 2022-11-03 LAB — TSH: TSH: 0.375 u[IU]/mL — ABNORMAL LOW (ref 0.450–4.500)

## 2022-11-03 LAB — VITAMIN D 25 HYDROXY (VIT D DEFICIENCY, FRACTURES): Vit D, 25-Hydroxy: 46 ng/mL (ref 30.0–100.0)

## 2022-11-03 NOTE — Telephone Encounter (Signed)
Forms completed, signed and faxed to [phone] [employer].

## 2022-11-03 NOTE — Progress Notes (Signed)
Results sent through MyChart

## 2022-11-06 ENCOUNTER — Telehealth: Payer: Self-pay

## 2022-11-06 NOTE — Telephone Encounter (Signed)
Spoke to patient and conveyed negative TB results.  Scheduled office visit for 03/03/2023

## 2022-12-08 NOTE — Telephone Encounter (Signed)
I called [employer] at [phone] to check on her Humira assistance paperwork. They talked to [place] on 12/04/2022 and told her they need her proof of income faxed/mailed or uploaded to them.

## 2022-12-11 ENCOUNTER — Encounter: Payer: Self-pay | Admitting: Internal Medicine

## 2022-12-11 NOTE — Telephone Encounter (Signed)
I called and had to leave her a voice mail message just checking in to see if she had sent them per proof of income.

## 2022-12-25 NOTE — Telephone Encounter (Signed)
I spoke with Mystique and she said she still hasn't sent in her proof of income. I gave her the [employer] phone # to call and encouraged her to call them.

## 2023-01-15 ENCOUNTER — Telehealth: Payer: Self-pay

## 2023-01-15 NOTE — Telephone Encounter (Signed)
Spoke with patient & advised her that fax was received from [employer] and d/t patient having insurance additional coverage for her Humira cannot be provided. Prior auth note from May shows patient will have a copay of $60 without the insurance. She recently sent in an income form to [employer] & I advised her to give them a call to discuss her financial situation with them further. She's also been advised to call us back with any questions.

## 2023-02-24 ENCOUNTER — Ambulatory Visit (INDEPENDENT_AMBULATORY_CARE_PROVIDER_SITE_OTHER): Payer: Medicare Other | Admitting: Medical

## 2023-02-24 VITALS — BP 146/92 | HR 78 | Wt 237.0 lb

## 2023-02-24 DIAGNOSIS — R946 Abnormal results of thyroid function studies: Secondary | ICD-10-CM | POA: Diagnosis not present

## 2023-02-24 DIAGNOSIS — I10 Essential (primary) hypertension: Secondary | ICD-10-CM | POA: Diagnosis not present

## 2023-02-24 MED ORDER — AMLODIPINE-OLMESARTAN 5-20 MG PO TABS: 1.0000 | ORAL_TABLET | Freq: Every day | ORAL | 2 refills | Status: DC

## 2023-02-24 NOTE — Progress Notes (Signed)
Subjective:  Katie Robinson is a 67 y.o. female who presents for Chief Complaint  Patient presents with   Hypertension    High bp readings today. 150/98 160/102 162/98.      Here for BP concerns.  In general compliant with amlodipine 5mg  daily but doesn't always take same time of day. Went to a screening recently health screen, and BP was quiet elevated.  They advised she make f/u with PCP. Has had a headache but no chest pain, no leg swelling, no palpitations.   Last Friday had steroid shots in knees by ortho   Does not check BP at home.   She notes she has been exercising less due to some recent knee issues.    No significant changes in diet of late.   No other aggravating or relieving factors.    No other c/o.  Past Medical History:  Diagnosis Date   Allergy    SEASONAL   Anemia    Arthralgia    Asthma    controlled   Breast cancer, left breast (HCC) 05/2017   left breast cancer   Chronic headache    many years   COVID-19 virus infection 06/2020   Family history of premature coronary artery disease    father   Former smoker    10 pack year history   GERD (gastroesophageal reflux disease)    mild, occasional   H/O bone density study 11/2015   normal   H/O cardiovascular stress test 2009   History of radiation therapy 08/12/17- 09/08/17   Left Breast 40.05 Gy in 15 fractions, Left Breast Boost/ 10 Gy in 5 fractions.    Hx of adenomatous and sessile serrated colonic polyps 05/24/2014   Iron deficiency 11/29/2015   Obesity    Pectus excavatum    mild, left asymmetric   Personal history of radiation therapy    S/P hysterectomy    due to fibroids   Seasonal allergic rhinitis    UC (ulcerative colitis) (HCC) 2015   Dr. Leone Payor   Urinary tract infection 2013   UTI due to extended-spectrum beta lactamase (ESBL) producing Escherichia coli 09/20/2017   Vitamin D deficiency 07/18/2014   Wears glasses    Current Outpatient Medications on File Prior to Visit  Medication Sig  Dispense Refill   albuterol (VENTOLIN HFA) 108 (90 Base) MCG/ACT inhaler TAKE 2 PUFFS BY MOUTH EVERY 6 HOURS AS NEEDED FOR WHEEZE OR SHORTNESS OF BREATH 60 g 0   Ascorbic Acid (VITAMIN C) 100 MG tablet Take 100 mg by mouth daily.     Cholecalciferol (VITAMIN D) 50 MCG (2000 UT) CAPS Take 1 capsule (2,000 Units total) by mouth daily. 90 capsule 3   ferrous sulfate 325 (65 FE) MG tablet Take 1 tablet (325 mg total) by mouth daily with breakfast.  3   Glucos-Chond-Hyal Ac-Ca Fructo (MOVE FREE JOINT HEALTH ADVANCE) TABS Take 1 tablet by mouth daily at 6 (six) AM.     HUMIRA, 2 SYRINGE, 40 MG/0.4ML PSKT Inject into skin subcutaneous once every 2 weeks. 2 each 5   Multiple Vitamins-Iron (MULTIVITAMINS WITH IRON) TABS tablet Take 1 tablet by mouth daily.     vitamin B-12 (CYANOCOBALAMIN) 100 MCG tablet Take 100 mcg by mouth daily.     diphenoxylate-atropine (LOMOTIL) 2.5-0.025 MG tablet 1-2 every 6 hours prn diarrhea (Patient not taking: Reported on 02/24/2023) 60 tablet 1   No current facility-administered medications on file prior to visit.     The following portions of  the patient's history were reviewed and updated as appropriate: allergies, current medications, past family history, past medical history, past social history, past surgical history and problem list.  ROS Otherwise as in subjective above  Objective: BP (!) 146/92   Pulse 78   Wt 237 lb (107.5 kg)   BMI 34.50 kg/m   BP Readings from Last 3 Encounters:  02/24/23 (!) 146/92  11/02/22 118/68  10/28/22 (!) 144/88   Wt Readings from Last 3 Encounters:  02/24/23 237 lb (107.5 kg)  11/02/22 238 lb (108 kg)  10/28/22 242 lb 12.8 oz (110.1 kg)   General appearance: alert, no distress, well developed, well nourished Neck: supple, no lymphadenopathy, no thyromegaly, no masses Heart: RRR, normal S1, S2, no murmurs Lungs: CTA bilaterally, no wheezes, rhonchi, or rales Pulses: 2+ radial pulses, 2+ pedal pulses, normal cap  refill Ext: no edema   Assessment: Encounter Diagnoses  Name Primary?   Essential hypertension Yes   Abnormal thyroid function test      Plan: Discussed recent elevated readings.  My reading today 146/90.     Adjustments in medicaiton today as below.  Patient Instructions  Elevated blood pressures Stop your current amlodipine 5 mg daily Begin a different medicine amlodipine/olmesartan also known as Azor 5/20 mg 1 tablet daily in the morning Take this every day at the same time Drink at least 80 to 100 ounces of water a day Limit salt and alcohol Eat plenty of fruits and vegetables daily In general avoid eating excessive amounts of cheese and meat products Get a blood pressure cuff and start checking your blood pressure several days per week and write these numbers down The goal is 120/70 We do not want to see the readings under 110/60 as this can also cause some symptoms such as dizziness or lightheadedness Lets follow-up in 1 month on the blood pressures  On your next visit I will plan to recheck your thyroid labs and your kidney marker since we are changing the blood pressure pill today   Eris was seen today for hypertension.  Diagnoses and all orders for this visit:  Essential hypertension -     Ambulatory referral to Cardiology  Abnormal thyroid function test  Other orders -     amLODipine-olmesartan (AZOR) 5-20 MG tablet; Take 1 tablet by mouth daily.    Follow up: 58mo

## 2023-02-24 NOTE — Patient Instructions (Signed)
Elevated blood pressures Stop your current amlodipine 5 mg daily Begin a different medicine amlodipine/olmesartan also known as Azor 5/20 mg 1 tablet daily in the morning Take this every day at the same time Drink at least 80 to 100 ounces of water a day Limit salt and alcohol Eat plenty of fruits and vegetables daily In general avoid eating excessive amounts of cheese and meat products Get a blood pressure cuff and start checking your blood pressure several days per week and write these numbers down The goal is 120/70 We do not want to see the readings under 110/60 as this can also cause some symptoms such as dizziness or lightheadedness Lets follow-up in 1 month on the blood pressures  On your next visit I will plan to recheck your thyroid labs and your kidney marker since we are changing the blood pressure pill today

## 2023-02-25 ENCOUNTER — Other Ambulatory Visit: Payer: Self-pay | Admitting: Medical

## 2023-03-03 ENCOUNTER — Encounter: Payer: Self-pay | Admitting: Internal Medicine

## 2023-03-03 ENCOUNTER — Telehealth: Payer: Self-pay | Admitting: Internal Medicine

## 2023-03-03 ENCOUNTER — Ambulatory Visit (INDEPENDENT_AMBULATORY_CARE_PROVIDER_SITE_OTHER): Payer: Medicare Other | Admitting: Internal Medicine

## 2023-03-03 ENCOUNTER — Other Ambulatory Visit (INDEPENDENT_AMBULATORY_CARE_PROVIDER_SITE_OTHER): Payer: Medicare Other

## 2023-03-03 VITALS — BP 136/80 | HR 82 | Ht 69.5 in | Wt 238.0 lb

## 2023-03-03 DIAGNOSIS — E611 Iron deficiency: Secondary | ICD-10-CM

## 2023-03-03 DIAGNOSIS — Z86010 Personal history of colonic polyps: Secondary | ICD-10-CM | POA: Diagnosis not present

## 2023-03-03 DIAGNOSIS — Z860101 Personal history of adenomatous and serrated colon polyps: Secondary | ICD-10-CM

## 2023-03-03 DIAGNOSIS — Z7960 Long term (current) use of unspecified immunomodulators and immunosuppressants: Secondary | ICD-10-CM

## 2023-03-03 DIAGNOSIS — Z52008 Unspecified donor, other blood: Secondary | ICD-10-CM | POA: Diagnosis not present

## 2023-03-03 DIAGNOSIS — K5150 Left sided colitis without complications: Secondary | ICD-10-CM

## 2023-03-03 LAB — CBC WITH DIFFERENTIAL/PLATELET
Basophils Absolute: 0.1 10*3/uL (ref 0.0–0.1)
Basophils Relative: 0.5 % (ref 0.0–3.0)
Eosinophils Absolute: 0.1 10*3/uL (ref 0.0–0.7)
Eosinophils Relative: 1.1 % (ref 0.0–5.0)
HCT: 45.3 % (ref 36.0–46.0)
Hemoglobin: 14.6 g/dL (ref 12.0–15.0)
Lymphocytes Relative: 33.6 % (ref 12.0–46.0)
Lymphs Abs: 3.7 10*3/uL (ref 0.7–4.0)
MCHC: 32.3 g/dL (ref 30.0–36.0)
MCV: 85.2 fl (ref 78.0–100.0)
Monocytes Absolute: 0.9 10*3/uL (ref 0.1–1.0)
Monocytes Relative: 8.2 % (ref 3.0–12.0)
Neutro Abs: 6.2 10*3/uL (ref 1.4–7.7)
Neutrophils Relative %: 56.6 % (ref 43.0–77.0)
Platelets: 346 10*3/uL (ref 150.0–400.0)
RBC: 5.32 Mil/uL — ABNORMAL HIGH (ref 3.87–5.11)
RDW: 14 % (ref 11.5–15.5)
WBC: 10.9 10*3/uL — ABNORMAL HIGH (ref 4.0–10.5)

## 2023-03-03 LAB — FERRITIN: Ferritin: 27.7 ng/mL (ref 10.0–291.0)

## 2023-03-03 NOTE — Telephone Encounter (Signed)
This lady was seen in May, and we obtained a prior authorization from [facility] for Humira.  I am not sure what happened but she never started it.  Prior to that she had gone through patient assistance requests and had what sounds like an approval and then a call saying no she made too much money and was denied that.  Monchel, you left a note in the chart that she was approved back in May but the patient says she never heard from the pharmacy about this being ready?.  She did not contact me about it to see if it was available, either.  She says she can pay the $60 a month.  Please advise what we should do question represcribe and ask for another prior authorization or there is something else I should do?  Thanks

## 2023-03-03 NOTE — Progress Notes (Signed)
Katie Robinson 67 y.o. Jan 14, 1956 [id number]  Assessment & Plan:   Encounter Diagnoses  Name Primary?   Left sided ulcerative (chronic) colitis (HCC) Yes   Long-term use of immunosuppressant medication    Hx of adenomatous and sessile serrated colonic polyps    Blood donor    Iron deficiency    We will check a CBC and ferritin today.  Will look into the Humira issue and what the status is.  I am puzzled by this it looks like it was approved but she had mixed messages and said nobody ever contacted her from a pharmacy about it being ready?  She has been off Humira since May 2024.  I will hold off on a colonoscopy I would prefer to do it once she is back on Humira probably about 6 months.  I think we are okay from a timing perspective to delay this at this point.  I have recommended she stop donating blood at least for the time being.  Let me see what her ferritin and hemoglobin are.  She reports that about 8 weeks ago and her last blood donation hemoglobin was 13.  Will place a colonoscopy recall for February 2025 as a backup CC: Tysinger, Kermit Balo, PA-C   Subjective:   Chief Complaint: Left-sided ulcerative colitis  HPI 67 year old African-American woman with a history of left-sided ulcerative colitis last seen in the spring, she was off her Humira and we worked on represcribing that, the records show that it was approved by [facility] and would be $60 a month. She also had filled out some financial assistance forms previously and had 1 message that it would be paid for and then another message that it was not.  In the end she never restarted her Humira.  At that time (May) she had a mild anemia and was started on iron.  She had seen Mr. Tysinger in follow-up and in May her ferritin was normal.  No CBC at that time.  She actually had been donating blood and continues to do so.  She reports formed bowel movements without bleeding or mucus.  Months ago prior to starting iron she had a  little bit of slight blood and mucus seen but none since starting iron.  She also has a history of adenomatous and SSP colon polyp, none in 2019 and is due for a 5-year surveillance for that also. No Known Allergies Current Meds  Medication Sig   albuterol (VENTOLIN HFA) 108 (90 Base) MCG/ACT inhaler TAKE 2 PUFFS BY MOUTH EVERY 6 HOURS AS NEEDED FOR WHEEZE OR SHORTNESS OF BREATH   amLODipine-olmesartan (AZOR) 5-20 MG tablet Take 1 tablet by mouth daily.   Ascorbic Acid (VITAMIN C) 100 MG tablet Take 100 mg by mouth daily.   Cholecalciferol (VITAMIN D) 50 MCG (2000 UT) CAPS Take 1 capsule (2,000 Units total) by mouth daily.   ferrous sulfate 325 (65 FE) MG tablet Take 1 tablet (325 mg total) by mouth daily with breakfast.   Multiple Vitamins-Iron (MULTIVITAMINS WITH IRON) TABS tablet Take 1 tablet by mouth daily.   vitamin B-12 (CYANOCOBALAMIN) 100 MCG tablet Take 100 mcg by mouth daily.   Past Medical History:  Diagnosis Date   Allergy    SEASONAL   Anemia    Arthralgia    Asthma    controlled   Breast cancer, left breast (HCC) 05/2017   left breast cancer   Chronic headache    many years   COVID-19 virus infection  06/2020   Family history of premature coronary artery disease    father   Former smoker    10 pack year history   GERD (gastroesophageal reflux disease)    mild, occasional   H/O bone density study 11/2015   normal   H/O cardiovascular stress test 2009   History of radiation therapy 08/12/17- 09/08/17   Left Breast 40.05 Gy in 15 fractions, Left Breast Boost/ 10 Gy in 5 fractions.    Hx of adenomatous and sessile serrated colonic polyps 05/24/2014   Iron deficiency 11/29/2015   Obesity    Pectus excavatum    mild, left asymmetric   Personal history of radiation therapy    S/P hysterectomy    due to fibroids   Seasonal allergic rhinitis    UC (ulcerative colitis) (HCC) 2015   Dr. Leone Payor   Urinary tract infection 2013   UTI due to extended-spectrum beta  lactamase (ESBL) producing Escherichia coli 09/20/2017   Vitamin D deficiency 07/18/2014   Wears glasses    Past Surgical History:  Procedure Laterality Date   BREAST LUMPECTOMY Left 06/2017   BREAST LUMPECTOMY WITH RADIOACTIVE SEED AND SENTINEL LYMPH NODE BIOPSY Left 06/29/2017   Procedure: LEFT BREAST LUMPECTOMY WITH RADIOACTIVE SEED AND LEFT AXILLARY SENTINEL LYMPH NODE BIOPSY ERAS PATHWAY;  Surgeon: Emelia Loron, MD;  Location: [facility];  Service: General;  Laterality: Left;   COLONOSCOPY  02/2016   polyps; Dr. Stan Head   EVACUATION BREAST HEMATOMA Left 07/11/2017   Procedure: EVACUATION HEMATOMA BREAST;  Surgeon: Romie Levee, MD;  Location: [facility];  Service: General;  Laterality: Left;   FOOT TENDON SURGERY Left 2013   NAVEL REMOVED     AS A CHILD   OTHER SURGICAL HISTORY     biopsy for possible sarcoidosis   POLYPECTOMY     SKIN BIOPSY     TONSILLECTOMY     TOTAL ABDOMINAL HYSTERECTOMY  2002   total; Dr. Richarda Overlie   WISDOM TOOTH EXTRACTION     Social History   Social History Narrative   Lives with her son, has 2 adult children, not married,    Retired from the [employer]   Exercise 6 days per week.     10/2022   family history includes Alzheimer's disease in her mother; Aneurysm in her mother; Asthma in her father and sister; Heart disease in her mother; Heart disease (age of onset: 10) in her father; Hypertension in her mother and sister.   Review of Systems Recent elevation of blood pressure after yoga class (health screening) and had medications changed by Mr. Tysinger  Objective:   Physical Exam BP 136/80   Pulse 82   Ht 5' 9.5" (1.765 m)   Wt 238 lb (108 kg)   BMI 34.64 kg/m

## 2023-03-03 NOTE — Patient Instructions (Signed)
_______________________________________________________  If your blood pressure at your visit was 140/90 or greater, please contact your primary care physician to follow up on this.  _______________________________________________________  If you are age 66 or older, your body mass index should be between 23-30. Your Body mass index is 34.64 kg/m. If this is out of the aforementioned range listed, please consider follow up with your Primary Care Provider.  If you are age 60 or younger, your body mass index should be between 19-25. Your Body mass index is 34.64 kg/m. If this is out of the aformentioned range listed, please consider follow up with your Primary Care Provider.   ________________________________________________________  The [facility] providers would like to encourage you to use [facility] to communicate with providers for non-urgent requests or questions.  Due to long hold times on the telephone, sending your provider a message by [facility] may be a faster and more efficient way to get a response.  Please allow 48 business hours for a response.  Please remember that this is for non-urgent requests.  _______________________________________________________  Your provider has requested that you go to the basement level for lab work before leaving today. Press "B" on the elevator. The lab is located at the first door on the left as you exit the elevator.  Please call us in 2 weeks regarding your humira to the nurse. We will try to see what is going on.  It was a pleasure to see you today!  Thank you for trusting me with your gastrointestinal care!

## 2023-03-04 ENCOUNTER — Other Ambulatory Visit (HOSPITAL_COMMUNITY): Payer: Self-pay

## 2023-03-04 ENCOUNTER — Telehealth: Payer: Self-pay | Admitting: Pharmacy Technician

## 2023-03-04 NOTE — Telephone Encounter (Signed)
Previous PA expired on 8.12.24. I submitted a new PA for this medication. As for the patient assistance, I can call and see what the outcome was with them or if patient was not income eligible.

## 2023-03-04 NOTE — Telephone Encounter (Signed)
Pharmacy Patient Advocate Encounter  Received notification from [facility] that Prior Authorization for HUMIRA 40MG  has been DENIED.  See denial reason below. No denial letter attached in CMM. Will attache denial letter to Media tab once received.    PA #/Case ID/Reference #: [id number]  Please be advised we currently do not have a Pharmacist to review denials, therefore you will need to process appeals accordingly as needed. Thanks for your support at this time. Contact for appeals are as follows: Phone: [phone], Fax: [phone]

## 2023-03-04 NOTE — Telephone Encounter (Addendum)
Pharmacy Patient Advocate Encounter   Received notification from Pt Calls Messages that prior authorization for HUMIRA 40MG  is required/requested.   Insurance verification completed.   The patient is insured through [facility] .   Per test claim: PA required; PA submitted to [facility] via CoverMyMeds Key/confirmation #/EOC [id number] Status is pending

## 2023-03-08 ENCOUNTER — Other Ambulatory Visit (HOSPITAL_COMMUNITY): Payer: Self-pay

## 2023-03-08 NOTE — Telephone Encounter (Signed)
Pharmacy Patient Advocate Encounter  Received notification from [facility] that Prior Authorization for HUMIRA 40MG  has been APPROVED from 9.19.24 to 12.31.24. Ran test claim, Copay is $60. This test claim was processed through [facility]- copay amounts may vary at other pharmacies due to pharmacy/plan contracts, or as the patient moves through the different stages of their insurance plan.   PA #/Case ID/Reference #: [id number]

## 2023-03-08 NOTE — Telephone Encounter (Signed)
I have pended Rxs  Need a double check to see that they are correct (not sure the starter kit has enough syringes)  Viviann Spare, once we know please send the Rxs and set up a follow-up with me for January

## 2023-03-09 MED ORDER — HUMIRA-CD/UC/HS STARTER 80 MG/0.8ML ~~LOC~~ AJKT: AUTO-INJECTOR | SUBCUTANEOUS | 0 refills | Status: DC

## 2023-03-09 MED ORDER — HUMIRA (2 PEN) 40 MG/0.4ML ~~LOC~~ AJKT: 40.0000 mg | AUTO-INJECTOR | SUBCUTANEOUS | 5 refills | Status: DC

## 2023-03-09 NOTE — Telephone Encounter (Signed)
Prescription reviewed and sent to pharmacy. Left message for pt to call back

## 2023-03-09 NOTE — Telephone Encounter (Signed)
Pt made aware that prescription has been sent to pharmacy: Pt scheduled for an office visit with Dr. Leone Payor on 06/24/2023 at 2:30 PM. Pt made aware.  Pt verbalized understanding with all questions answered.

## 2023-03-18 ENCOUNTER — Other Ambulatory Visit: Payer: Self-pay

## 2023-03-18 NOTE — Telephone Encounter (Signed)
Inbound call from [facility], states they are requesting for the order to be changed to syringes because the patient prefers syringes over pens.

## 2023-03-19 NOTE — Telephone Encounter (Signed)
Inbound call from patient, states pharmacy said medication needed a prior authorization.

## 2023-03-19 NOTE — Telephone Encounter (Signed)
Spoke to [employer] with [employer]. Katie Robinson stated that the syringes do not come in the 80 mg dose for the starter pack.  Maintenance  dose was changed to the syringes.  Pt was made aware. Pt verbalized understanding with all questions answered.

## 2023-04-22 ENCOUNTER — Encounter: Payer: Self-pay | Admitting: Cardiology

## 2023-04-22 ENCOUNTER — Ambulatory Visit: Payer: Medicare Other | Attending: Cardiology | Admitting: Cardiology

## 2023-04-22 VITALS — BP 136/86 | HR 88 | Ht 72.0 in | Wt 235.6 lb

## 2023-04-22 DIAGNOSIS — R5383 Other fatigue: Secondary | ICD-10-CM

## 2023-04-22 DIAGNOSIS — R0683 Snoring: Secondary | ICD-10-CM | POA: Diagnosis not present

## 2023-04-22 DIAGNOSIS — R400 Somnolence: Secondary | ICD-10-CM

## 2023-04-22 DIAGNOSIS — I10 Essential (primary) hypertension: Secondary | ICD-10-CM

## 2023-04-22 DIAGNOSIS — R7303 Prediabetes: Secondary | ICD-10-CM

## 2023-04-22 NOTE — Patient Instructions (Signed)
Medication Instructions:  Your physician recommends that you continue on your current medications as directed. Please refer to the Current Medication list given to you today.  *If you need a refill on your cardiac medications before your next appointment, please call your pharmacy*   Lab Work: None   Testing/Procedures: Your physician has requested that you have an echocardiogram. Echocardiography is a painless test that uses sound waves to create images of your heart. It provides your doctor with information about the size and shape of your heart and how well your heart's chambers and valves are working. This procedure takes approximately one hour. There are no restrictions for this procedure. Please do NOT wear cologne, perfume, aftershave, or lotions (deodorant is allowed). Please arrive 15 minutes prior to your appointment time.  Please note: We ask at that you not bring children with you during ultrasound (echo/ vascular) testing. Due to room size and safety concerns, children are not allowed in the ultrasound rooms during exams. Our front office staff cannot provide observation of children in our lobby area while testing is being conducted. An adult accompanying a patient to their appointment will only be allowed in the ultrasound room at the discretion of the ultrasound technician under special circumstances. We apologize for any inconvenience.  WatchPAT?  Is a FDA cleared portable home sleep study test that uses a watch and 3 points of contact to monitor 7 different channels, including your heart rate, oxygen saturations, body position, snoring, and chest motion.  The study is easy to use from the comfort of your own home and accurately detect sleep apnea.  Before bed, you attach the chest sensor, attached the sleep apnea bracelet to your nondominant hand, and attach the finger probe.  After the study, the raw data is downloaded from the watch and scored for apnea events.   For more  information: [email or web address]   Patient Testing Instructions:  Do not put battery into the device until bedtime when you are ready to begin the test. Please call the support number if you need assistance after following the instructions below: 24 hour support line- [phone] or [facility] support at [phone] (option 2)  Download the IntelWatchPAT One" app through the google play store or App Store  Be sure to turn on or enable access to bluetooth in settlings on your smartphone/ device  Make sure no other bluetooth devices are on and within the vicinity of your smartphone/ device and WatchPAT watch during testing.  Make sure to leave your smart phone/ device plugged in and charging all night.  When ready for bed:  Follow the instructions step by step in the WatchPAT One App to activate the testing device. For additional instructions, including video instruction, visit the WatchPAT One video on Youtube. You can search for WatchPat One within Youtube (video is 4 minutes and 18 seconds) or enter: https://youtube/watch?[email or web address]_vbiwxE Please note: You will be prompted to enter a Pin to connect via bluetooth when starting the test. The PIN will be assigned to you when you receive the test.  The device is disposable, but it recommended that you retain the device until you receive a call letting you know the study has been received and the results have been interpreted.  We will let you know if the study did not transmit to [place] properly after the test is completed. You do not need to call us to confirm the receipt of the test.  Please complete the test within 48 hours of receiving PIN.  Frequently Asked Questions:  What is Watch Dennie Bible one?  A single use fully disposable home sleep apnea testing device and will not need to be returned after completion.  What are the requirements to use WatchPAT one?  The be able to have a successful watchpat one sleep study, you should have  your Watch pat one device, your smart phone, watch pat one app, your PIN number and Internet access What type of phone do I need?  You should have a smart phone that uses Android 5.1 and above or any Iphone with IOS 10 and above How can I download the WatchPAT one app?  Based on your device type search for WatchPAT one app either in google play for android devices or APP store for Iphone's Where will I get my PIN for the study?  Your PIN will be provided by your physician's office. It is used for authentication and if you lose/forget your PIN, please reach out to your providers office.  I do not have Internet at home. Can I do WatchPAT one study?  WatchPAT One needs Internet connection throughout the night to be able to transmit the sleep data. You can use your home/local internet or your cellular's data package. However, it is always recommended to use home/local Internet. It is estimated that between 20MB-30MB will be used with each study.However, the application will be looking for space in the phone to start the study.  What happens if I lose internet or bluetooth connection?  During the internet disconnection, your phone will not be able to transmit the sleep data. All the data, will be stored in your phone. As soon as the internet connection is back on, the phone will being sending the sleep data. During the bluetooth disconnection, WatchPAT one will not be able to to send the sleep data to your phone. Data will be kept in the [facility] one until two devices have bluetooth connection back on. As soon as the connection is back on, WatchPAT one will send the sleep data to the phone.  How long do I need to wear the WatchPAT one?  After you start the study, you should wear the device at least 6 hours.  How far should I keep my phone from the device?  During the night, your phone should be within 15 feet.  What happens if I leave the room for restroom or other reasons?  Leaving the room for any  reason will not cause any problem. As soon as your get back to the room, both devices will reconnect and will continue to send the sleep data. Can I use my phone during the sleep study?  Yes, you can use your phone as usual during the study. But it is recommended to put your watchpat one on when you are ready to go to bed.  How will I get my study results?  A soon as you completed your study, your sleep data will be sent to the provider. They will then share the results with you when they are ready.    Follow-Up: At [facility], you and your health needs are our priority.  As part of our continuing mission to provide you with exceptional heart care, we have created designated Provider Care Teams.  These Care Teams include your primary Cardiologist (physician) and Advanced Practice Providers (APPs -  Physician Assistants and Nurse Practitioners) who all work together to provide you with the care you need, when you need it.   Your next  appointment:   6 months  Provider:   Thomasene Ripple, DO +

## 2023-04-22 NOTE — Progress Notes (Signed)
Cardiology Office Note:    Date:  04/22/2023   ID:  Katie Robinson, DOB 08/01/1955, MRN [id number]  PCP:  Jac Canavan, PA-C  Cardiologist:  Thomasene Ripple, DO  Electrophysiologist:  None   Referring MD: Jac Canavan, PA-C   " I am having some shortness of breath"  History of Present Illness:    Katie Robinson is a 67 y.o. female with a history of hypertension and breast cancer, presents for evaluation of high blood pressure. She reports that during a routine screening at an active adult center, her blood pressure was significantly elevated, prompting immediate contact with her primary care physician. Her medication was subsequently changed from amlodipine to a combination of amlodipine and olmesartan, which has seemingly normalized her blood pressure. However, she expresses concern about potential heart enlargement due to the high blood pressure.  She reports that she has been experiencing daytime somnolence worsening fatigue as well as she admits to snoring.  The patient has a family history of atrial fibrillation (AFib) but has not noticed any symptoms of this condition herself. She also mentions a prediabetic diagnosis and a love for cookies, indicating a potential need for dietary changes.  Past Medical History:  Diagnosis Date   Allergy    SEASONAL   Anemia    Arthralgia    Asthma    controlled   Breast cancer, left breast (HCC) 05/2017   left breast cancer   Chronic headache    many years   COVID-19 virus infection 06/2020   Family history of premature coronary artery disease    father   Former smoker    10 pack year history   GERD (gastroesophageal reflux disease)    mild, occasional   H/O bone density study 11/2015   normal   H/O cardiovascular stress test 2009   History of radiation therapy 08/12/17- 09/08/17   Left Breast 40.05 Gy in 15 fractions, Left Breast Boost/ 10 Gy in 5 fractions.    Hx of adenomatous and sessile serrated colonic polyps 05/24/2014    Iron deficiency 11/29/2015   Obesity    Pectus excavatum    mild, left asymmetric   Personal history of radiation therapy    S/P hysterectomy    due to fibroids   Seasonal allergic rhinitis    UC (ulcerative colitis) (HCC) 2015   Dr. Leone Payor   Urinary tract infection 2013   UTI due to extended-spectrum beta lactamase (ESBL) producing Escherichia coli 09/20/2017   Vitamin D deficiency 07/18/2014   Wears glasses     Past Surgical History:  Procedure Laterality Date   BREAST LUMPECTOMY Left 06/2017   BREAST LUMPECTOMY WITH RADIOACTIVE SEED AND SENTINEL LYMPH NODE BIOPSY Left 06/29/2017   Procedure: LEFT BREAST LUMPECTOMY WITH RADIOACTIVE SEED AND LEFT AXILLARY SENTINEL LYMPH NODE BIOPSY ERAS PATHWAY;  Surgeon: Emelia Loron, MD;  Location: [facility];  Service: General;  Laterality: Left;   COLONOSCOPY  02/2016   polyps; Dr. Stan Head   EVACUATION BREAST HEMATOMA Left 07/11/2017   Procedure: EVACUATION HEMATOMA BREAST;  Surgeon: Romie Levee, MD;  Location: [facility];  Service: General;  Laterality: Left;   FOOT TENDON SURGERY Left 2013   NAVEL REMOVED     AS A CHILD   OTHER SURGICAL HISTORY     biopsy for possible sarcoidosis   POLYPECTOMY     SKIN BIOPSY     TONSILLECTOMY     TOTAL ABDOMINAL HYSTERECTOMY  2002   total; Dr. Richarda Overlie  WISDOM TOOTH EXTRACTION      Current Medications: Current Meds  Medication Sig   adalimumab (HUMIRA, 2 PEN,) 40 MG/0.4ML pen Inject 0.4 mLs (40 mg total) into the skin every 14 (fourteen) days. To start after starter kit   albuterol (VENTOLIN HFA) 108 (90 Base) MCG/ACT inhaler TAKE 2 PUFFS BY MOUTH EVERY 6 HOURS AS NEEDED FOR WHEEZE OR SHORTNESS OF BREATH   amLODipine-olmesartan (AZOR) 5-20 MG tablet Take 1 tablet by mouth daily.   Ascorbic Acid (VITAMIN C) 100 MG tablet Take 100 mg by mouth daily.   Cholecalciferol (VITAMIN D) 50 MCG (2000 UT) CAPS Take 1 capsule (2,000 Units total) by mouth daily.    diphenoxylate-atropine (LOMOTIL) 2.5-0.025 MG tablet 1-2 every 6 hours prn diarrhea   ferrous sulfate 325 (65 FE) MG tablet Take 1 tablet (325 mg total) by mouth daily with breakfast.   Glucos-Chond-Hyal Ac-Ca Fructo (MOVE FREE JOINT HEALTH ADVANCE) TABS Take 1 tablet by mouth daily at 6 (six) AM.   Multiple Vitamins-Iron (MULTIVITAMINS WITH IRON) TABS tablet Take 1 tablet by mouth daily.   vitamin B-12 (CYANOCOBALAMIN) 100 MCG tablet Take 100 mcg by mouth daily.     Allergies:   Patient has no known allergies.   Social History   Socioeconomic History   Marital status: Divorced    Spouse name: Not on file   Number of children: 2   Years of education: Not on file   Highest education level: Not on file  Occupational History   Occupation: retired [place] [employer]  Tobacco Use   Smoking status: Former    Current packs/day: 0.00    Types: Cigarettes    Quit date: 06/15/1988    Years since quitting: 34.8   Smokeless tobacco: Never  Vaping Use   Vaping status: Never Used  Substance and Sexual Activity   Alcohol use: Yes    Alcohol/week: 0.0 standard drinks of alcohol    Comment: rarely   Drug use: No   Sexual activity: Not on file  Other Topics Concern   Not on file  Social History Narrative   Lives with her son, has 2 adult children, not married,    Retired from the [employer]   Exercise 6 days per week.     10/2022   Social Determinants of Health   Financial Resource Strain: Not on file  Food Insecurity: Not on file  Transportation Needs: Not on file  Physical Activity: Not on file  Stress: Not on file  Social Connections: Not on file     Family History: The patient's family history includes Alzheimer's disease in her mother; Aneurysm in her mother; Asthma in her father and sister; Heart disease in her mother; Heart disease (age of onset: 62) in her father; Hypertension in her mother and sister. There is no history of Cancer, Stroke, Diabetes, Colon cancer, Colon  polyps, Crohn's disease, Ulcerative colitis, or Hypercalcemia.  ROS:   Review of Systems  Constitution: Negative for decreased appetite, fever and weight gain.  HENT: Negative for congestion, ear discharge, hoarse voice and sore throat.   Eyes: Negative for discharge, redness, vision loss in right eye and visual halos.  Cardiovascular: Negative for chest pain, dyspnea on exertion, leg swelling, orthopnea and palpitations.  Respiratory: Negative for cough, hemoptysis, shortness of breath and snoring.   Endocrine: Negative for heat intolerance and polyphagia.  Hematologic/Lymphatic: Negative for bleeding problem. Does not bruise/bleed easily.  Skin: Negative for flushing, nail changes, rash and suspicious lesions.  Musculoskeletal:  Negative for arthritis, joint pain, muscle cramps, myalgias, neck pain and stiffness.  Gastrointestinal: Negative for abdominal pain, bowel incontinence, diarrhea and excessive appetite.  Genitourinary: Negative for decreased libido, genital sores and incomplete emptying.  Neurological: Negative for brief paralysis, focal weakness, headaches and loss of balance.  Psychiatric/Behavioral: Negative for altered mental status, depression and suicidal ideas.  Allergic/Immunologic: Negative for HIV exposure and persistent infections.    EKGs/Labs/Other Studies Reviewed:    The following studies were reviewed today:   EKG:  The ekg ordered today demonstrates   Recent Labs: 10/28/2022: ALT 9; BUN 11; Creatinine, Ser 0.70; Potassium 3.8; Sodium 138 11/02/2022: TSH 0.375 03/03/2023: Hemoglobin 14.6; Platelets 346.0  Recent Lipid Panel    Component Value Date/Time   CHOL 235 (H) 11/02/2022 1519   TRIG 112 11/02/2022 1519   HDL 87 11/02/2022 1519   CHOLHDL 2.7 11/02/2022 1519   CHOLHDL 2.9 05/10/2017 0942   VLDL 20 05/30/2013 1613   LDLCALC 129 (H) 11/02/2022 1519   LDLCALC 119 (H) 05/10/2017 0942    Physical Exam:    VS:  BP 136/86 (BP Location: Right Arm,  Patient Position: Sitting, Cuff Size: Normal)   Pulse 88   Ht 6' (1.829 m)   Wt 235 lb 9.6 oz (106.9 kg)   SpO2 96%   BMI 31.95 kg/m     Wt Readings from Last 3 Encounters:  04/22/23 235 lb 9.6 oz (106.9 kg)  03/03/23 238 lb (108 kg)  02/24/23 237 lb (107.5 kg)     GEN: Well nourished, well developed in no acute distress HEENT: Normal NECK: No JVD; No carotid bruits LYMPHATICS: No lymphadenopathy CARDIAC: S1S2 noted,RRR, no murmurs, rubs, gallops RESPIRATORY:  Clear to auscultation without rales, wheezing or rhonchi  ABDOMEN: Soft, non-tender, non-distended, +bowel sounds, no guarding. EXTREMITIES: No edema, No cyanosis, no clubbing MUSCULOSKELETAL:  No deformity  SKIN: Warm and dry NEUROLOGIC:  Alert and oriented x 3, non-focal PSYCHIATRIC:  Normal affect, good insight  ASSESSMENT:    1. Daytime somnolence   2. Fatigue, unspecified type   3. Snoring   4. Essential hypertension   5. Prediabetes    PLAN:     Hypertension Recent episode of elevated blood pressure at a screening, currently managed with Amlodipine and Omicertin. Blood pressure at today's visit was 134/72. -Continue current medications.  Shortness of breath/fatigue-with her shortness of breath and fatigue in the setting of her hypertensive heart disease will be beneficial to get an echocardiogram in this patient to assess for any structural abnormalities.  Fatigue Daytime somnolence Snoring Will benefit from a sleep study.  STOP-BANG score is 5 suggesting moderate to severe risk for obstructive sleep apnea. Will set her up with the [facility].  Prediabetes HbA1c of 5.28 in May 2024. -Advise patient to reduce carbohydrate and sugar intake, particularly cookies, to prevent progression to diabetes.  Follow-up Plan to see patient again in 12 weeks to review results of tests and assess blood pressure control. If any urgent issues arise from tests, will contact patient sooner.  The patient is in agreement  with the above plan. The patient left the office in stable condition.  The patient will follow up in   Medication Adjustments/Labs and Tests Ordered: Current medicines are reviewed at length with the patient today.  Concerns regarding medicines are outlined above.  Orders Placed This Encounter  Procedures   ECHOCARDIOGRAM COMPLETE   [facility] Sleep Study   No orders of the defined types were placed in this encounter.  Patient Instructions  Medication Instructions:  Your physician recommends that you continue on your current medications as directed. Please refer to the Current Medication list given to you today.  *If you need a refill on your cardiac medications before your next appointment, please call your pharmacy*   Lab Work: None   Testing/Procedures: Your physician has requested that you have an echocardiogram. Echocardiography is a painless test that uses sound waves to create images of your heart. It provides your doctor with information about the size and shape of your heart and how well your heart's chambers and valves are working. This procedure takes approximately one hour. There are no restrictions for this procedure. Please do NOT wear cologne, perfume, aftershave, or lotions (deodorant is allowed). Please arrive 15 minutes prior to your appointment time.  Please note: We ask at that you not bring children with you during ultrasound (echo/ vascular) testing. Due to room size and safety concerns, children are not allowed in the ultrasound rooms during exams. Our front office staff cannot provide observation of children in our lobby area while testing is being conducted. An adult accompanying a patient to their appointment will only be allowed in the ultrasound room at the discretion of the ultrasound technician under special circumstances. We apologize for any inconvenience.  WatchPAT?  Is a FDA cleared portable home sleep study test that uses a watch and 3 points of contact  to monitor 7 different channels, including your heart rate, oxygen saturations, body position, snoring, and chest motion.  The study is easy to use from the comfort of your own home and accurately detect sleep apnea.  Before bed, you attach the chest sensor, attached the sleep apnea bracelet to your nondominant hand, and attach the finger probe.  After the study, the raw data is downloaded from the watch and scored for apnea events.   For more information: [email or web address]   Patient Testing Instructions:  Do not put battery into the device until bedtime when you are ready to begin the test. Please call the support number if you need assistance after following the instructions below: 24 hour support line- [phone] or [facility] support at [phone] (option 2)  Download the IntelWatchPAT One" app through the google play store or App Store  Be sure to turn on or enable access to bluetooth in settlings on your smartphone/ device  Make sure no other bluetooth devices are on and within the vicinity of your smartphone/ device and WatchPAT watch during testing.  Make sure to leave your smart phone/ device plugged in and charging all night.  When ready for bed:  Follow the instructions step by step in the WatchPAT One App to activate the testing device. For additional instructions, including video instruction, visit the WatchPAT One video on Youtube. You can search for WatchPat One within Youtube (video is 4 minutes and 18 seconds) or enter: https://youtube/watch?[email or web address]_vbiwxE Please note: You will be prompted to enter a Pin to connect via bluetooth when starting the test. The PIN will be assigned to you when you receive the test.  The device is disposable, but it recommended that you retain the device until you receive a call letting you know the study has been received and the results have been interpreted.  We will let you know if the study did not transmit to [place] properly after  the test is completed. You do not need to call us to confirm the receipt of the test.  Please complete the test within 48 hours  of receiving PIN.    Frequently Asked Questions:  What is Watch Dennie Bible one?  A single use fully disposable home sleep apnea testing device and will not need to be returned after completion.  What are the requirements to use WatchPAT one?  The be able to have a successful watchpat one sleep study, you should have your Watch pat one device, your smart phone, watch pat one app, your PIN number and Internet access What type of phone do I need?  You should have a smart phone that uses Android 5.1 and above or any Iphone with IOS 10 and above How can I download the WatchPAT one app?  Based on your device type search for WatchPAT one app either in google play for android devices or APP store for Iphone's Where will I get my PIN for the study?  Your PIN will be provided by your physician's office. It is used for authentication and if you lose/forget your PIN, please reach out to your providers office.  I do not have Internet at home. Can I do WatchPAT one study?  WatchPAT One needs Internet connection throughout the night to be able to transmit the sleep data. You can use your home/local internet or your cellular's data package. However, it is always recommended to use home/local Internet. It is estimated that between 20MB-30MB will be used with each study.However, the application will be looking for space in the phone to start the study.  What happens if I lose internet or bluetooth connection?  During the internet disconnection, your phone will not be able to transmit the sleep data. All the data, will be stored in your phone. As soon as the internet connection is back on, the phone will being sending the sleep data. During the bluetooth disconnection, WatchPAT one will not be able to to send the sleep data to your phone. Data will be kept in the [facility] one until two devices  have bluetooth connection back on. As soon as the connection is back on, WatchPAT one will send the sleep data to the phone.  How long do I need to wear the WatchPAT one?  After you start the study, you should wear the device at least 6 hours.  How far should I keep my phone from the device?  During the night, your phone should be within 15 feet.  What happens if I leave the room for restroom or other reasons?  Leaving the room for any reason will not cause any problem. As soon as your get back to the room, both devices will reconnect and will continue to send the sleep data. Can I use my phone during the sleep study?  Yes, you can use your phone as usual during the study. But it is recommended to put your watchpat one on when you are ready to go to bed.  How will I get my study results?  A soon as you completed your study, your sleep data will be sent to the provider. They will then share the results with you when they are ready.    Follow-Up: At [facility], you and your health needs are our priority.  As part of our continuing mission to provide you with exceptional heart care, we have created designated Provider Care Teams.  These Care Teams include your primary Cardiologist (physician) and Advanced Practice Providers (APPs -  Physician Assistants and Nurse Practitioners) who all work together to provide you with the care you need, when you need  it.   Your next appointment:   6 months  Provider:   Thomasene Ripple, DO +     Adopting a Healthy Lifestyle.  Know what a healthy weight is for you (roughly BMI <25) and aim to maintain this   Aim for 7+ servings of fruits and vegetables daily   65-80+ fluid ounces of water or unsweet tea for healthy kidneys   Limit to max 1 drink of alcohol per day; avoid smoking/tobacco   Limit animal fats in diet for cholesterol and heart health - choose grass fed whenever available   Avoid highly processed foods, and foods high in  saturated/trans fats   Aim for low stress - take time to unwind and care for your mental health   Aim for 150 min of moderate intensity exercise weekly for heart health, and weights twice weekly for bone health   Aim for 7-9 hours of sleep daily   When it comes to diets, agreement about the perfect plan isnt easy to find, even among the experts. Experts at the [facility] of [employer] developed an idea known as the Healthy Eating Plate. Just imagine a plate divided into logical, healthy portions.   The emphasis is on diet quality:   Load up on vegetables and fruits - one-half of your plate: Aim for color and variety, and remember that potatoes dont count.   Go for whole grains - one-quarter of your plate: Whole wheat, barley, wheat berries, quinoa, oats, brown rice, and foods made with them. If you want pasta, go with whole wheat pasta.   Protein power - one-quarter of your plate: Fish, chicken, beans, and nuts are all healthy, versatile protein sources. Limit red meat.   The diet, however, does go beyond the plate, offering a few other suggestions.   Use healthy plant oils, such as olive, canola, soy, corn, sunflower and peanut. Check the labels, and avoid partially hydrogenated oil, which have unhealthy trans fats.   If youre thirsty, drink water. Coffee and tea are good in moderation, but skip sugary drinks and limit milk and dairy products to one or two daily servings.   The type of carbohydrate in the diet is more important than the amount. Some sources of carbohydrates, such as vegetables, fruits, whole grains, and beans-are healthier than others.   Finally, stay active  Signed, Thomasene Ripple, DO  04/22/2023 3:32 PM    [facility]

## 2023-04-26 ENCOUNTER — Telehealth: Payer: Self-pay

## 2023-04-26 NOTE — Telephone Encounter (Signed)
Patient agreement reviewed and signed on 04/22/23.  WatchPAT issued to patient on 04/22/23 by Myna Hidalgo, CMA. Patient aware to not open the WatchPAT box until contacted with the activation PIN. Patient profile initialized in CloudPAT on 04/26/2023 by Ashley Mariner, LPN. Device serial number: [id number]

## 2023-05-25 ENCOUNTER — Ambulatory Visit: Payer: Medicare Other | Admitting: Family Medicine

## 2023-05-25 ENCOUNTER — Encounter: Payer: Self-pay | Admitting: Family Medicine

## 2023-05-25 VITALS — BP 122/76 | HR 86 | Ht 72.0 in | Wt 235.6 lb

## 2023-05-25 DIAGNOSIS — N3000 Acute cystitis without hematuria: Secondary | ICD-10-CM | POA: Diagnosis not present

## 2023-05-25 DIAGNOSIS — R350 Frequency of micturition: Secondary | ICD-10-CM | POA: Diagnosis not present

## 2023-05-25 LAB — POCT URINALYSIS DIP (PROADVANTAGE DEVICE)
Bilirubin, UA: NEGATIVE
Blood, UA: NEGATIVE
Glucose, UA: NEGATIVE mg/dL
Ketones, POC UA: NEGATIVE mg/dL
Nitrite, UA: NEGATIVE
Protein Ur, POC: 30 mg/dL — AB
Specific Gravity, Urine: 1.02
Urobilinogen, Ur: 0.2
pH, UA: 6 (ref 5.0–8.0)

## 2023-05-25 MED ORDER — SULFAMETHOXAZOLE-TRIMETHOPRIM 800-160 MG PO TABS: 1.0000 | ORAL_TABLET | Freq: Two times a day (BID) | ORAL | 0 refills | Status: DC

## 2023-05-25 NOTE — Patient Instructions (Addendum)
Take Azo Standard that you can get over-the-counter for the bladder symptoms

## 2023-05-25 NOTE — Progress Notes (Signed)
   Subjective:    Patient ID: Katie Robinson, female    DOB: Sep 22, 1955, 67 y.o.   MRN: [id number]  HPI She complains of a several week history of frequency and dysuria as well as urgency.  No other symptoms.   Review of Systems     Objective:    Physical Exam Alert and in no distress.  No abdominal tenderness noted.  Urinalysis positive for white cells.       Assessment & Plan:  Acute cystitis without hematuria - Plan: sulfamethoxazole-trimethoprim (BACTRIM DS) 800-160 MG tablet  Urinary frequency - Plan: POCT Urinalysis DIP (Proadvantage Device)   Also recommend using Azo Standard to help with symptoms.

## 2023-05-26 ENCOUNTER — Ambulatory Visit (HOSPITAL_COMMUNITY): Payer: Medicare Other | Attending: Cardiology

## 2023-05-26 DIAGNOSIS — R5383 Other fatigue: Secondary | ICD-10-CM | POA: Diagnosis present

## 2023-05-26 DIAGNOSIS — I351 Nonrheumatic aortic (valve) insufficiency: Secondary | ICD-10-CM | POA: Insufficient documentation

## 2023-05-26 DIAGNOSIS — Z8249 Family history of ischemic heart disease and other diseases of the circulatory system: Secondary | ICD-10-CM | POA: Diagnosis not present

## 2023-05-26 DIAGNOSIS — C50919 Malignant neoplasm of unspecified site of unspecified female breast: Secondary | ICD-10-CM | POA: Diagnosis not present

## 2023-05-26 DIAGNOSIS — I119 Hypertensive heart disease without heart failure: Secondary | ICD-10-CM | POA: Diagnosis not present

## 2023-05-26 LAB — ECHOCARDIOGRAM COMPLETE
Area-P 1/2: 5.79 cm2
P 1/2 time: 309 ms
S' Lateral: 3.1 cm

## 2023-05-27 ENCOUNTER — Other Ambulatory Visit: Payer: Self-pay | Admitting: Medical

## 2023-06-11 ENCOUNTER — Telehealth: Payer: Self-pay | Admitting: Internal Medicine

## 2023-06-11 ENCOUNTER — Telehealth: Payer: Self-pay | Admitting: Pharmacy Technician

## 2023-06-11 NOTE — Telephone Encounter (Signed)
Inbound call from [facility] pharmacy stating they have sent over cover my meds request for Humira on 12/20. States patient needs prior auth renewal. Please advise, thank you.

## 2023-06-11 NOTE — Telephone Encounter (Signed)
Pharmacy Patient Advocate Encounter   Received notification from CoverMyMeds that prior authorization for HUMIRA 40MG  is required/requested.   Insurance verification completed.   The patient is insured through [facility] .   Per test claim: PA required; PA submitted to above mentioned insurance via CoverMyMeds Key/confirmation #/EOC [id number] Status is pending

## 2023-06-11 NOTE — Telephone Encounter (Signed)
PA has been submitted, and telephone encounter has been created. 

## 2023-06-24 ENCOUNTER — Ambulatory Visit (INDEPENDENT_AMBULATORY_CARE_PROVIDER_SITE_OTHER): Payer: Medicare Other | Admitting: Internal Medicine

## 2023-06-24 ENCOUNTER — Encounter: Payer: Self-pay | Admitting: Internal Medicine

## 2023-06-24 ENCOUNTER — Other Ambulatory Visit (INDEPENDENT_AMBULATORY_CARE_PROVIDER_SITE_OTHER): Payer: Medicare Other

## 2023-06-24 ENCOUNTER — Other Ambulatory Visit: Payer: Self-pay | Admitting: Internal Medicine

## 2023-06-24 VITALS — BP 114/68 | HR 96 | Ht 70.25 in | Wt 238.5 lb

## 2023-06-24 DIAGNOSIS — E611 Iron deficiency: Secondary | ICD-10-CM | POA: Diagnosis not present

## 2023-06-24 DIAGNOSIS — K5150 Left sided colitis without complications: Secondary | ICD-10-CM

## 2023-06-24 DIAGNOSIS — T454X5A Adverse effect of iron and its compounds, initial encounter: Secondary | ICD-10-CM | POA: Diagnosis not present

## 2023-06-24 DIAGNOSIS — Z860101 Personal history of adenomatous and serrated colon polyps: Secondary | ICD-10-CM

## 2023-06-24 DIAGNOSIS — Z7960 Long term (current) use of unspecified immunomodulators and immunosuppressants: Secondary | ICD-10-CM

## 2023-06-24 DIAGNOSIS — K5903 Drug induced constipation: Secondary | ICD-10-CM | POA: Diagnosis not present

## 2023-06-24 LAB — CBC WITH DIFFERENTIAL/PLATELET
Basophils Absolute: 0.1 10*3/uL (ref 0.0–0.1)
Basophils Relative: 0.6 % (ref 0.0–3.0)
Eosinophils Absolute: 0.3 10*3/uL (ref 0.0–0.7)
Eosinophils Relative: 2.7 % (ref 0.0–5.0)
HCT: 39.3 % (ref 36.0–46.0)
Hemoglobin: 12.8 g/dL (ref 12.0–15.0)
Lymphocytes Relative: 37.1 % (ref 12.0–46.0)
Lymphs Abs: 4.1 10*3/uL — ABNORMAL HIGH (ref 0.7–4.0)
MCHC: 32.6 g/dL (ref 30.0–36.0)
MCV: 87.5 fL (ref 78.0–100.0)
Monocytes Absolute: 0.9 10*3/uL (ref 0.1–1.0)
Monocytes Relative: 7.7 % (ref 3.0–12.0)
Neutro Abs: 5.8 10*3/uL (ref 1.4–7.7)
Neutrophils Relative %: 51.9 % (ref 43.0–77.0)
Platelets: 360 10*3/uL (ref 150.0–400.0)
RBC: 4.49 Mil/uL (ref 3.87–5.11)
RDW: 13.8 % (ref 11.5–15.5)
WBC: 11.2 10*3/uL — ABNORMAL HIGH (ref 4.0–10.5)

## 2023-06-24 LAB — FERRITIN: Ferritin: 16.1 ng/mL (ref 10.0–291.0)

## 2023-06-24 MED ORDER — NA SULFATE-K SULFATE-MG SULF 17.5-3.13-1.6 GM/177ML PO SOLN: 1.0000 | Freq: Once | ORAL | 0 refills | Status: AC

## 2023-06-24 NOTE — Progress Notes (Signed)
 Katie Robinson 67 y.o. September 01, 1955 [id number]  Assessment & Plan:   Encounter Diagnoses  Name Primary?   Left sided ulcerative (chronic) colitis (HCC) Yes   Hx of adenomatous and sessile serrated colonic polyps    Long-term use of immunosuppressant medication    Iron deficiency    Drug-induced constipation (iron)    Schedule colonoscopy for surveillance of polyps and to reassess left-sided ulcerative colitis  Hold ferrous sulfate  as she thinks that is causing constipation.  Recheck iron and CBC.   Lab Results  Component Value Date   WBC 11.2 (H) 06/24/2023   HGB 12.8 06/24/2023   HCT 39.3 06/24/2023   MCV 87.5 06/24/2023   PLT 360.0 06/24/2023   Lab Results  Component Value Date   FERRITIN 16.1 06/24/2023    Ferritin is actually lower she is not anemic.  She should continue to avoid blood donation and she has.  Reassess labs after colonoscopy.  Subjective:   Chief Complaint: Follow-up of ulcerative colitis  HPI 68 year old African-American woman on biweekly Humira  for left-sided ulcerative colitis, with endoscopic and pathologic remission 2019 colonoscopy who has a history of colon polyps as well last seen here in September 2024.  I had seen her in May and she had been off Humira  and this was still an issue in September and then she thinks she restarted it in October.  Since that time not having any diarrhea abdominal pain or bleeding though she has been constipated which she attributes to her ferrous sulfate  supplement.  She had been a blood donor and was iron deficient without anemia so iron supplementation was started.    She does report that she received a message from [facility] that she needs a refill on her Humira .  A prior authorization request was received and initiated but has not been completed yet.  No Known Allergies Current Meds  Medication Sig   adalimumab  (HUMIRA , 2 PEN,) 40 MG/0.4ML pen Inject 0.4 mLs (40 mg total) into the skin every 14 (fourteen)  days. To start after starter kit   amLODipine -olmesartan  (AZOR ) 5-20 MG tablet TAKE 1 TABLET BY MOUTH EVERY DAY   Ascorbic Acid (VITAMIN C) 100 MG tablet Take 100 mg by mouth daily.   Cholecalciferol  (VITAMIN D ) 50 MCG (2000 UT) CAPS Take 1 capsule (2,000 Units total) by mouth daily.   ferrous sulfate  325 (65 FE) MG tablet Take 1 tablet (325 mg total) by mouth daily with breakfast.   [DISCONTINUED] sulfamethoxazole -trimethoprim  (BACTRIM  DS) 800-160 MG tablet Take 1 tablet by mouth 2 (two) times daily.   [DISCONTINUED] vitamin B-12 (CYANOCOBALAMIN) 100 MCG tablet Take 100 mcg by mouth daily.   Past Medical History:  Diagnosis Date   Allergy    SEASONAL   Anemia    Arthralgia    Asthma    controlled   Breast cancer, left breast (HCC) 05/2017   left breast cancer   Chronic headache    many years   COVID-19 virus infection 06/2020   Family history of premature coronary artery disease    father   Former smoker    10 pack year history   GERD (gastroesophageal reflux disease)    mild, occasional   H/O bone density study 11/2015   normal   H/O cardiovascular stress test 2009   History of radiation therapy 08/12/17- 09/08/17   Left Breast 40.05 Gy in 15 fractions, Left Breast Boost/ 10 Gy in 5 fractions.    Hx of adenomatous and sessile serrated colonic polyps 05/24/2014  Iron deficiency 11/29/2015   Obesity    Pectus excavatum    mild, left asymmetric   Personal history of radiation therapy    S/P hysterectomy    due to fibroids   Seasonal allergic rhinitis    UC (ulcerative colitis) (HCC) 2015   Dr. Avram   Urinary tract infection 2013   UTI due to extended-spectrum beta lactamase (ESBL) producing Escherichia coli 09/20/2017   Vitamin D  deficiency 07/18/2014   Wears glasses    Past Surgical History:  Procedure Laterality Date   BREAST LUMPECTOMY Left 06/2017   BREAST LUMPECTOMY WITH RADIOACTIVE SEED AND SENTINEL LYMPH NODE BIOPSY Left 06/29/2017   Procedure: LEFT BREAST  LUMPECTOMY WITH RADIOACTIVE SEED AND LEFT AXILLARY SENTINEL LYMPH NODE BIOPSY ERAS PATHWAY;  Surgeon: Ebbie Cough, MD;  Location: [facility];  Service: General;  Laterality: Left;   COLONOSCOPY  02/2016   polyps; Dr. Lupita Avram   EVACUATION BREAST HEMATOMA Left 07/11/2017   Procedure: EVACUATION HEMATOMA BREAST;  Surgeon: Debby Hila, MD;  Location: [facility];  Service: General;  Laterality: Left;   FOOT TENDON SURGERY Left 2013   NAVEL REMOVED     AS A CHILD   OTHER SURGICAL HISTORY     biopsy for possible sarcoidosis   POLYPECTOMY     SKIN BIOPSY     TONSILLECTOMY     TOTAL ABDOMINAL HYSTERECTOMY  2002   total; Dr. Charlie Croak   WISDOM TOOTH EXTRACTION     Social History   Social History Narrative   Lives with her son, has 2 adult children, not married,    Retired from the [employer]   Exercise 6 days per week.     10/2022   family history includes Alzheimer's disease in her mother; Aneurysm in her mother; Asthma in her father and sister; Atrial fibrillation in her nephew and sister; Heart disease in her mother; Heart disease (age of onset: 5) in her father; Hypertension in her mother and sister.   Review of Systems As per HPI  Objective:   Physical Exam @BP  114/68   Pulse 96   Ht 5' 10.25 (1.784 m) Comment: measured in office  Wt 238 lb 8 oz (108.2 kg)   SpO2 98%   BMI 33.98 kg/m @  General:  NAD Eyes:   anicteric Lungs:  clear Heart::  S1S2 no rubs, murmurs or gallops Abdomen:  soft and nontender, BS+ Ext:   no edema, cyanosis or clubbing    Data Reviewed:

## 2023-06-24 NOTE — Telephone Encounter (Signed)
 Katie Robinson,  Can we please get an update on the PA for humira. Thank you.

## 2023-06-24 NOTE — Patient Instructions (Signed)
 You have been scheduled for a colonoscopy. Please follow written instructions given to you at your visit today.   Please pick up your prep supplies at the pharmacy within the next 1-3 days.  If you use inhalers (even only as needed), please bring them with you on the day of your procedure.  DO NOT TAKE 7 DAYS PRIOR TO TEST- Trulicity (dulaglutide) Ozempic, Wegovy (semaglutide) Mounjaro (tirzepatide) Bydureon Bcise (exanatide extended release)  DO NOT TAKE 1 DAY PRIOR TO YOUR TEST Rybelsus (semaglutide) Adlyxin (lixisenatide) Victoza (liraglutide) Byetta (exanatide) ___________________________________________________________________________  Your provider has requested that you go to the basement level for lab work before leaving today. Press B on the elevator. The lab is located at the first door on the left as you exit the elevator.  Due to recent changes in healthcare laws, you may see the results of your imaging and laboratory studies on MyChart before your provider has had a chance to review them.  We understand that in some cases there may be results that are confusing or concerning to you. Not all laboratory results come back in the same time frame and the provider may be waiting for multiple results in order to interpret others.  Please give us  48 hours in order for your provider to thoroughly review all the results before contacting the office for clarification of your results.   Stop your iron.  I appreciate the opportunity to care for you. Lupita Commander, MD, [facility]

## 2023-06-25 ENCOUNTER — Other Ambulatory Visit (HOSPITAL_COMMUNITY): Payer: Self-pay

## 2023-06-25 NOTE — Telephone Encounter (Signed)
 Pharmacy Patient Advocate Encounter  Received notification from [employer] that Prior Authorization for HUMIRA  40MG  has been APPROVED from 12.27.24 to 12.31.25. Ran test claim, Copay is $60. This test claim was processed through [facility]- copay amounts may vary at other pharmacies due to pharmacy/plan contracts, or as the patient moves through the different stages of their insurance plan.   PA #/Case ID/Reference #: [id number]

## 2023-06-25 NOTE — Telephone Encounter (Signed)
 Form for prior authorization received & faxed to [facility].

## 2023-06-27 ENCOUNTER — Other Ambulatory Visit (HOSPITAL_COMMUNITY): Payer: Self-pay

## 2023-06-28 NOTE — Telephone Encounter (Signed)
 Patient informed and said thank you for letting her know.

## 2023-07-09 ENCOUNTER — Telehealth (INDEPENDENT_AMBULATORY_CARE_PROVIDER_SITE_OTHER): Payer: Medicare Other | Admitting: Nurse Practitioner

## 2023-07-09 ENCOUNTER — Encounter: Payer: Self-pay | Admitting: Nurse Practitioner

## 2023-07-09 DIAGNOSIS — Z20828 Contact with and (suspected) exposure to other viral communicable diseases: Secondary | ICD-10-CM | POA: Diagnosis not present

## 2023-07-09 DIAGNOSIS — N3090 Cystitis, unspecified without hematuria: Secondary | ICD-10-CM | POA: Insufficient documentation

## 2023-07-09 DIAGNOSIS — J4541 Moderate persistent asthma with (acute) exacerbation: Secondary | ICD-10-CM | POA: Insufficient documentation

## 2023-07-09 MED ORDER — CIPROFLOXACIN HCL 500 MG PO TABS: 500.0000 mg | ORAL_TABLET | Freq: Two times a day (BID) | ORAL | 0 refills | Status: DC

## 2023-07-09 MED ORDER — LEVOCETIRIZINE DIHYDROCHLORIDE 5 MG PO TABS: 5.0000 mg | ORAL_TABLET | Freq: Every evening | ORAL | 3 refills | Status: DC

## 2023-07-09 MED ORDER — OSELTAMIVIR PHOSPHATE 75 MG PO CAPS: 75.0000 mg | ORAL_CAPSULE | Freq: Two times a day (BID) | ORAL | 0 refills | Status: DC

## 2023-07-09 MED ORDER — PREDNISONE 20 MG PO TABS: 20.0000 mg | ORAL_TABLET | Freq: Every day | ORAL | 0 refills | Status: DC

## 2023-07-09 NOTE — Assessment & Plan Note (Signed)
Symptoms consistent with recurrence of cystitis most likely due to post-menopausal anatomy in the setting of loose stools with UC. We discussed how this makes it easier to get infections. She recently took a course of bactrim so this time we will send ciprofloxacin treatment. She has been using AZO for symptoms that started yesterday Rhyse Loux morning. Recommend increased water intake to help flush the kidneys and monitoring for worsening symptoms. We discussed if she continues to have frequent UTI to contact her PCP to discuss possible preventative treatment.

## 2023-07-09 NOTE — Progress Notes (Signed)
Virtual Visit Encounter mychart visit.   I connected with  Katie Robinson on 07/09/23 at  2:00 PM EST by secure video and audio telemedicine application. I verified that I am speaking with the correct person using two identifiers.   I introduced myself as a Publishing rights manager with the practice. The limitations of evaluation and management by telemedicine discussed with the patient and the availability of in person appointments. The patient expressed verbal understanding and consent to proceed.  Participating parties in this visit include: Myself and patient  The patient is: Patient Location: Home I am: Provider Location: Office/Clinic Subjective:    CC and HPI: Katie Robinson is a 68 y.o. year old female presenting for new evaluation and treatment of flu exposure and uti.  Katie Robinson presents today with concerns of flu exposure with an onset of symptoms of congestions, post nasal drip, cough, and chest tightness that she awoke with this morning. She has a history of asthma and has been using her albuterol inhaler with no improvement of the cough and tightness in the chest. She has not run a fever that she is aware of, but feels unwell in general with sudden onset. She has not taken anything for this recently.   She also reports a recurrence of UTI symptoms that started yesterday. She has a history of UC and had a flair last week resulting in diarrhea, which she thinks may have contributed. Her last UTI was about a month ago and she was treated with Bactrim.   Other health concerns that impact her situation are chronic immunosuppression with Humira.    Past medical history, Surgical history, Family history not pertinant except as noted below, Social history, Allergies, and medications have been entered into the medical record, reviewed, and corrections made.   Review of Systems:  All review of systems negative except what is listed in the HPI  Objective:    Alert and oriented x 4 Hoarseness  and cough present. Mild congestion noted in voice RR appear to be WNL with no distress.  Impression and Recommendations:    Problem List Items Addressed This Visit     Cystitis   Symptoms consistent with recurrence of cystitis most likely due to post-menopausal anatomy in the setting of loose stools with UC. We discussed how this makes it easier to get infections. She recently took a course of bactrim so this time we will send ciprofloxacin treatment. She has been using AZO for symptoms that started yesterday Katie Robinson morning. Recommend increased water intake to help flush the kidneys and monitoring for worsening symptoms. We discussed if she continues to have frequent UTI to contact her PCP to discuss possible preventative treatment.       Relevant Medications   ciprofloxacin (CIPRO) 500 MG tablet   Moderate persistent asthma with exacerbation   Exacerbation present following flu exposure. Short prednisone burst sent for airway inflammation. Continue with albuterol. If respiratory symptoms worsen or do not improve, please follow up.       Relevant Medications   predniSONE (DELTASONE) 20 MG tablet   levocetirizine (XYZAL) 5 MG tablet   Exposure to the flu - Primary   Recent known flu exposure with sudden onset of symptoms this morning. Most concerning symptoms include asthma exacerbation with chest tightness and cough as well as post nasal drip. Given exposure, we will begin tamiflu today as an Katie Robinson treatment option to prevent worsening of her condition. Given her immunosuppression she is at high risk of serious illness. She  did not have a flu shot yet this season. Xyzal sent for post nasal drip and prednisone burst sent to aid in asthma reduction. She is aware to monitor for worsening symptoms and seek emergency care or contact the office if symptoms worsen or do not improve.  Other recommendations include increased hydration, rest, mucinex.       Relevant Medications   oseltamivir (TAMIFLU)  75 MG capsule    orders and follow up as documented in EMR I discussed the assessment and treatment plan with the patient. The patient was provided an opportunity to ask questions and all were answered. The patient agreed with the plan and demonstrated an understanding of the instructions.   The patient was advised to call back or seek an in-person evaluation if the symptoms worsen or if the condition fails to improve as anticipated.  Follow-Up: prn  I provided 20 minutes of non-face-to-face interaction with this non face-to-face encounter including intake, same-day documentation, and chart review.   Tollie Eth, NP , DNP, AGNP-c [facility]

## 2023-07-09 NOTE — Assessment & Plan Note (Signed)
Recent known flu exposure with sudden onset of symptoms this morning. Most concerning symptoms include asthma exacerbation with chest tightness and cough as well as post nasal drip. Given exposure, we will begin tamiflu today as an Katie Robinson treatment option to prevent worsening of her condition. Given her immunosuppression she is at high risk of serious illness. She did not have a flu shot yet this season. Xyzal sent for post nasal drip and prednisone burst sent to aid in asthma reduction. She is aware to monitor for worsening symptoms and seek emergency care or contact the office if symptoms worsen or do not improve.  Other recommendations include increased hydration, rest, mucinex.

## 2023-07-09 NOTE — Patient Instructions (Signed)
Oseltamivir Capsules What is this medication? OSELTAMIVIR (os el TAM i vir) prevents and treats infections caused by the flu virus (influenza). It works by slowing the spread of the flu virus in your body and reducing how long your symptoms last. It will not treat colds or infections caused by bacteria or other viruses. It will not replace the annual flu vaccine. This medicine may be used for other purposes; ask your health care provider or pharmacist if you have questions. COMMON BRAND NAME(S): Tamiflu What should I tell my care team before I take this medication? They need to know if you have any of the following conditions: Difficulty swallowing Kidney disease An unusual or allergic reaction to oseltamivir, other medications, foods, dyes, or preservatives Pregnant or trying to get pregnant Breast-feeding How should I use this medication? Take this medication by mouth with water. Take it as directed on the prescription label at the same time every day. You can take it with or without food. If it upsets your stomach, take it with food. Take all of this medication unless your care team tells you to stop it Katie Robinson. Keep taking it even if you think you are better. When taking whole capsules: Do not cut, crush, or chew this medication. Swallow the capsules whole. When mixing capsule contents with liquid: Open the capsule and mix the contents in a small bowl with a small amount of a sweetened liquid such as chocolate syrup, corn syrup, caramel topping, or light brown sugar (dissolved in water). Stir the mixture and take the dose right away after mixing. Talk to your care team about the use of this medication in children. While it may be prescribed for selected conditions, precautions do apply. Overdosage: If you think you have taken too much of this medicine contact a poison control center or emergency room at once. NOTE: This medicine is only for you. Do not share this medicine with others. What if I  miss a dose? If you miss a dose, take it as soon as you remember. If it is almost time for your next dose (within 2 hours), take only that dose. Do not take double or extra doses. What may interact with this medication? Intranasal influenza vaccine This list may not describe all possible interactions. Give your health care provider a list of all the medicines, herbs, non-prescription drugs, or dietary supplements you use. Also tell them if you smoke, drink alcohol, or use illegal drugs. Some items may interact with your medicine. What should I watch for while using this medication? Visit your care team for regular checks on your progress. Tell your care team if your symptoms do not start to get better or if they get worse. If you have the flu, you may be at an increased risk of developing seizures, confusion, or abnormal behavior. This occurs Katie Robinson in the illness, and more frequently in children and teens. These events are not common, but may result in accidental injury to the patient. Families and caregivers of patients should watch for signs of unusual behavior and contact a care team right away if the patient shows signs of unusual behavior. To treat the flu, start taking this medication within 2 days of getting flu symptoms. This medication is not a substitute for the flu shot. Talk to your care team each year about an annual flu shot. What side effects may I notice from receiving this medication? Side effects that you should report to your care team as soon as possible: Allergic reactions--skin  rash, itching, hives, swelling of the face, lips, tongue, or throat Confusion Hallucinations Redness, blistering, peeling, or loosening of the skin, including inside the mouth Seizures Tremors or shaking Trouble speaking Unusual changes in behavior Side effects that usually do not require medical attention (report to your care team if they continue or are bothersome): Headache Nausea Vomiting This  list may not describe all possible side effects. Call your doctor for medical advice about side effects. You may report side effects to FDA at 1-800-FDA-1088. Where should I keep my medication? Keep out of the reach of children and pets. Store at room temperature between 15 and 30 degrees C (59 and 86 degrees F). Throw away any unused medication after the expiration date. NOTE: This sheet is a summary. It may not cover all possible information. If you have questions about this medicine, talk to your doctor, pharmacist, or health care provider.  2024 Elsevier/Gold Standard (2022-11-04 00:00:00)

## 2023-07-09 NOTE — Assessment & Plan Note (Signed)
Exacerbation present following flu exposure. Short prednisone burst sent for airway inflammation. Continue with albuterol. If respiratory symptoms worsen or do not improve, please follow up.

## 2023-07-23 ENCOUNTER — Other Ambulatory Visit: Payer: Self-pay | Admitting: Cardiology

## 2023-07-24 LAB — LIPID PANEL W/O CHOL/HDL RATIO
Cholesterol, Total: 255 mg/dL — ABNORMAL HIGH (ref 100–199)
HDL: 81 mg/dL (ref >39)
LDL Chol Calc (NIH): 160 mg/dL — ABNORMAL HIGH (ref 0–99)
Triglycerides: 84 mg/dL (ref 0–149)
VLDL Cholesterol Cal: 14 mg/dL (ref 5–40)

## 2023-08-02 ENCOUNTER — Other Ambulatory Visit: Payer: Self-pay | Admitting: Medical

## 2023-08-02 ENCOUNTER — Telehealth: Payer: Self-pay | Admitting: Internal Medicine

## 2023-08-02 DIAGNOSIS — Z136 Encounter for screening for cardiovascular disorders: Secondary | ICD-10-CM

## 2023-08-02 DIAGNOSIS — I10 Essential (primary) hypertension: Secondary | ICD-10-CM

## 2023-08-02 MED ORDER — FENOFIBRATE 145 MG PO TABS: 145.0000 mg | ORAL_TABLET | Freq: Every day | ORAL | 0 refills | Status: DC

## 2023-08-02 NOTE — Telephone Encounter (Signed)
-----   Message from [name] sent at 07/30/2023 12:03 PM EST ----- Katie Robinson - call patient  I was forwarded from labs on you regarding cholesterol.  Unfortunately her LDL cholesterol is higher than it has been in the past thing at risk for heart disease and stroke.  I have previously recommended CT coronary heart test to screen for heart disease as well as recommendation for cholesterol medicine.  I again recommend both of these.  If you want to do the CT first to see if there is any signs of atherosclerosis that is fine but typically we recommend LDL be less than 130 on everybody.  Your LDL cholesterol is higher than that.  So recommendation for medication is advised as well  Let me know how he wants to proceed    Recommendations for improving lipids:  Foods TO AVOID or limit - fried foods, high sugar foods, white bread, enriched flour, fast food, red meat, large amounts of cheese, processed foods such as little debbie cakes, cookies, pies, donuts, for example  Foods TO INCLUDE in the diet - whole grains such as whole grain pasta, whole grain bread, barley, steel cut oatmeal (not instant oatmeal), avocado, fish, green leafy vegetables, nuts, increased fiber in diet, and using olive oil in small amounts for cooking or as salad dressing vinaigrette. ----- Message ----- From: [name], LPN Sent: 1/61/0960   5:08 PM EST To: Jac Canavan, PA-C  Lipid Panel Results for Katie Robinson. Abnormal Results. Patient instructed to contact your office for further recommendations.   Thank you,  Clois Dupes, LPN [facility] [phone]

## 2023-08-02 NOTE — Telephone Encounter (Signed)
 Pt was notified of results   Pt is agreeable to CT and medications as long as its NOT a statin. She would like to try something other than statins. Send to [facility] [address]

## 2023-08-03 NOTE — Telephone Encounter (Signed)
 Katie Robinson has put this in and sent med to pharmacy

## 2023-08-09 ENCOUNTER — Ambulatory Visit (HOSPITAL_COMMUNITY)
Admission: RE | Admit: 2023-08-09 | Discharge: 2023-08-09 | Disposition: A | Discharge status: Home / Self Care | Payer: Self-pay | Source: Ambulatory Visit | Attending: Medical | Admitting: Medical

## 2023-08-09 DIAGNOSIS — I10 Essential (primary) hypertension: Secondary | ICD-10-CM | POA: Insufficient documentation

## 2023-08-09 DIAGNOSIS — Z136 Encounter for screening for cardiovascular disorders: Secondary | ICD-10-CM | POA: Insufficient documentation

## 2023-08-10 NOTE — Progress Notes (Signed)
 Results sent through MyChart

## 2023-08-13 ENCOUNTER — Ambulatory Visit: Payer: Self-pay | Admitting: Medical

## 2023-08-13 NOTE — Telephone Encounter (Addendum)
  Chief Complaint: info only, med question Additional Notes:  Calling re: missed call around 10 AM. Triager could not find outgoing call in chart, but was able to relay message from PCP re: CT results as stated in pt message on 08/10/2023. Pt also has a question about the medication PCP prescribed on 08/02/23 and if she needed to adjust dosing based on these results. Triager advised to take as prescribed until PCP can review encounter and advise. Of note, pt states that she has not picked up this Rx d/t financial issues, but will be picking up on 08/14/2023. Pt expecting call back when PCP office is open for further guidance.   Copied from CRM [id number]. Topic: Clinical - Lab/Test Results >> Aug 13, 2023  4:07 PM Carrielelia G wrote: Reason for CRM:  pt Katie Robinson would like to speak with medical regarding ct coronary results Reason for Disposition  Caller requesting routine or non-urgent lab result  Answer Assessment - Initial Assessment Questions 1. REASON FOR CALL or QUESTION: "What is your reason for calling today?" or "How can I best help you?" or "What question do you have that I can help answer?"     Calling re: missed call around 10 AM. Triager could not find outgoing call, but was able to relay message from PCP re: CT results on 08/10/2023. Pt also has a question about the medication PCP prescribed on 08/02/23 and if she needed to adjust dosing based on these results. Triager advised to take as prescribed until PCP can review encounter and advise. Of note, pt states that she has not picked up this Rx d/t financial issues.  Protocols used: Information Only Call - No Triage-A-AH, PCP Call - No Triage-A-AH

## 2023-08-20 ENCOUNTER — Ambulatory Visit: Payer: Medicare Other | Admitting: Internal Medicine

## 2023-08-20 ENCOUNTER — Encounter: Payer: Self-pay | Admitting: Internal Medicine

## 2023-08-20 VITALS — BP 151/69 | HR 72 | Temp 98.2°F | Resp 13 | Ht 70.0 in | Wt 238.0 lb

## 2023-08-20 DIAGNOSIS — Z860101 Personal history of adenomatous and serrated colon polyps: Secondary | ICD-10-CM | POA: Diagnosis not present

## 2023-08-20 DIAGNOSIS — K5150 Left sided colitis without complications: Secondary | ICD-10-CM

## 2023-08-20 DIAGNOSIS — Z1211 Encounter for screening for malignant neoplasm of colon: Secondary | ICD-10-CM

## 2023-08-20 MED ORDER — FERROUS FUMARATE 324 (106 FE) MG PO TABS: 1.0000 | ORAL_TABLET | ORAL | Status: DC

## 2023-08-20 MED ORDER — SODIUM CHLORIDE 0.9 % IV SOLN: 500.0000 mL | Freq: Once | INTRAVENOUS | Status: DC

## 2023-08-20 NOTE — Progress Notes (Signed)
 Vss nad trans to pacu

## 2023-08-20 NOTE — Op Note (Signed)
 [facility] Patient Name: Katie Robinson Procedure Date: 08/20/2023 2:06 PM MRN: [id number] Endoscopist: Iva Boop , MD, [id number] Age: 68 Referring MD:  Date of Birth: 02/03/1956 Gender: Female Account #: [id number] Procedure:                Colonoscopy Indications:              High risk colon cancer surveillance: Ulcerative                            left sided colitis of 8 (or more) years duration dx                            2015, + hx adenoma and ssp 2015 Last colonoscopy:                            2019 no polyps and colits in remission. she is on                            Humira 40 mg q 14 d Medicines:                Monitored Anesthesia Care Procedure:                Pre-Anesthesia Assessment:                           - Prior to the procedure, a History and Physical                            was performed, and patient medications and                            allergies were reviewed. The patient's tolerance of                            previous anesthesia was also reviewed. The risks                            and benefits of the procedure and the sedation                            options and risks were discussed with the patient.                            All questions were answered, and informed consent                            was obtained. Prior Anticoagulants: The patient has                            taken no anticoagulant or antiplatelet agents. ASA                            Grade Assessment: II - A patient with mild systemic  disease. After reviewing the risks and benefits,                            the patient was deemed in satisfactory condition to                            undergo the procedure.                           After obtaining informed consent, the colonoscope                            was passed under direct vision. Throughout the                            procedure, the patient's blood pressure,  pulse, and                            oxygen saturations were monitored continuously. The                            CF [id number] #[id number] was introduced through the anus                            and advanced to the the terminal ileum, with                            identification of the appendiceal orifice and IC                            valve. The colonoscopy was performed without                            difficulty. The patient tolerated the procedure                            well. The quality of the bowel preparation was                            good. The terminal ileum, ileocecal valve,                            appendiceal orifice, and rectum were photographed.                            The bowel preparation used was SUPREP via split                            dose instruction. Scope In: 2:15:36 PM Scope Out: 2:31:24 PM Scope Withdrawal Time: 0 hours 12 minutes 36 seconds  Total Procedure Duration: 0 hours 15 minutes 48 seconds  Findings:                 The perianal and digital rectal examinations were  normal.                           The terminal ileum appeared normal.                           The transverse colon, ascending colon and cecum                            appeared normal. Biopsies for histology were taken                            with a cold forceps from the cecum, ascending colon                            and transverse colon for evaluation of microscopic                            colitis. Verification of patient identification for                            the specimen was done. Estimated blood loss was                            minimal.                           Inflammation characterized by erythema,                            pseudopolyps and scarring was found. The transverse                            colon, the ascending colon and the appendiceal                            orifice were spared. When compared to  previous                            examinations, the findings are unchanged. Two                            biopsies were taken every 10 cm with a cold forceps                            from the descending colon, sigmoid colon and rectum                            for ulcerative colitis surveillance. These biopsy                            specimens were sent to Pathology. Verification of                            patient identification for  the specimen was done.                            Estimated blood loss was minimal.                           The exam was otherwise without abnormality on                            direct and retroflexion views. Complications:            No immediate complications. Estimated Blood Loss:     Estimated blood loss was minimal. Impression:               - The examined portion of the ileum was normal.                           - The transverse colon, ascending colon and cecum                            are normal. Biopsied.                           - Left-sided colitis. Inflammation was found.,                            unchanged compared to previous examinations.                            Biopsied.                           - The examination was otherwise normal on direct                            and retroflexion views. Recommendation:           - Patient has a contact number available for                            emergencies. The signs and symptoms of potential                            delayed complications were discussed with the                            patient. Return to normal activities tomorrow.                            Written discharge instructions were provided to the                            patient.                           - Resume previous diet.                           -  Continue present medications.                           - Await pathology results.                           - Repeat colonoscopy is recommended for                             surveillance. The colonoscopy date will be                            determined after pathology results from today's                            exam become available for review.                           try ferrous fumarate qod for iron deficiency w/ NL                            Hgb - she has constipation w/ ferrous sulfate Iva Boop, MD 08/20/2023 2:46:25 PM This report has been signed electronically.

## 2023-08-20 NOTE — Progress Notes (Signed)
 Called to room to assist during endoscopic procedure.  Patient ID and intended procedure confirmed with present staff. Received instructions for my participation in the procedure from the performing physician.

## 2023-08-20 NOTE — Patient Instructions (Addendum)
 I did not see signs of inflammation - await biopsies.  Your iron level was low in January but hemoglobin was ok.  I am prescribing a different iron preparation in hopes that it will not constipate you. It is listed below - it is over the counter so you need to purchase it. Hemocyte is brand name - you can look for ferrous fumarate generic.  Meds ordered this encounter  Medications      Ferrous Fumarate (HEMOCYTE - 106 MG FE) 324 (106 Fe) MG TABS tablet    Sig: Take 1 tablet (106 mg of iron total) by mouth every other day.   Please try that every other day and stay on that. If you cannot tolerate it let me know.  I will let you know pathology results and plans.  I appreciate the opportunity to care for you. Iva Boop, MD, [facility]  Resume previous diet.  Continue present medications.  Await pathology results.  Repeat colonoscopy is recommended for surveillance. The colonoscopy date will be determined after pathology results from today's exam become available for review.   YOU HAD AN ENDOSCOPIC PROCEDURE TODAY AT THE [facility]:   Refer to the procedure report that was given to you for any specific questions about what was found during the examination.  If the procedure report does not answer your questions, please call your gastroenterologist to clarify.  If you requested that your care partner not be given the details of your procedure findings, then the procedure report has been included in a sealed envelope for you to review at your convenience later.  YOU SHOULD EXPECT: Some feelings of bloating in the abdomen. Passage of more gas than usual.  Walking can help get rid of the air that was put into your GI tract during the procedure and reduce the bloating. If you had a lower endoscopy (such as a colonoscopy or flexible sigmoidoscopy) you may notice spotting of blood in your stool or on the toilet paper. If you underwent a bowel prep for your procedure, you may not have a  normal bowel movement for a few days.  Please Note:  You might notice some irritation and congestion in your nose or some drainage.  This is from the oxygen used during your procedure.  There is no need for concern and it should clear up in a day or so.  SYMPTOMS TO REPORT IMMEDIATELY:  Following lower endoscopy (colonoscopy or flexible sigmoidoscopy):  Excessive amounts of blood in the stool  Significant tenderness or worsening of abdominal pains  Swelling of the abdomen that is new, acute  Fever of 100F or higher  For urgent or emergent issues, a gastroenterologist can be reached at any hour by calling [phone]. Do not use MyChart messaging for urgent concerns.    DIET:  We do recommend a small meal at first, but then you may proceed to your regular diet.  Drink plenty of fluids but you should avoid alcoholic beverages for 24 hours.  ACTIVITY:  You should plan to take it easy for the rest of today and you should NOT DRIVE or use heavy machinery until tomorrow (because of the sedation medicines used during the test).    FOLLOW UP: Our staff will call the number listed on your records the next business day following your procedure.  We will call around 7:15- 8:00 am to check on you and address any questions or concerns that you may have regarding the information given to you following  your procedure. If we do not reach you, we will leave a message.     If any biopsies were taken you will be contacted by phone or by letter within the next 1-3 weeks.  Please call us at [phone] if you have not heard about the biopsies in 3 weeks.    SIGNATURES/CONFIDENTIALITY: You and/or your care partner have signed paperwork which will be entered into your electronic medical record.  These signatures attest to the fact that that the information above on your After Visit Summary has been reviewed and is understood.  Full responsibility of the confidentiality of this discharge information lies  with you and/or your care-partner.

## 2023-08-20 NOTE — Progress Notes (Signed)
 Pt's states no medical or surgical changes since previsit or office visit.

## 2023-08-20 NOTE — Progress Notes (Signed)
 [facility] History and Physical   Primary Care Physician:  Jac Canavan, PA-C   Reason for Procedure:    Encounter Diagnoses  Name Primary?   Left sided ulcerative (chronic) colitis (HCC) Yes   Hx of adenomatous and sessile serrated colonic polyps      Plan:    colonoscopy     HPI: Katie Robinson is a 68 y.o. female w/ left-sided ulcerative colitis and colon polyps - here for a surveillance colonoscopy exam.   Past Medical History:  Diagnosis Date   Allergy    SEASONAL   Anemia    Arthralgia    Asthma    controlled   Breast cancer, left breast (HCC) 05/2017   left breast cancer   Chronic headache    many years   COVID-19 virus infection 06/2020   Family history of premature coronary artery disease    father   Former smoker    10 pack year history   GERD (gastroesophageal reflux disease)    mild, occasional   H/O bone density study 11/2015   normal   H/O cardiovascular stress test 2009   History of radiation therapy 08/12/17- 09/08/17   Left Breast 40.05 Gy in 15 fractions, Left Breast Boost/ 10 Gy in 5 fractions.    Hx of adenomatous and sessile serrated colonic polyps 05/24/2014   Iron deficiency 11/29/2015   Obesity    Pectus excavatum    mild, left asymmetric   Personal history of radiation therapy    S/P hysterectomy    due to fibroids   Seasonal allergic rhinitis    UC (ulcerative colitis) (HCC) 2015   Dr. Leone Payor   Urinary tract infection 2013   UTI due to extended-spectrum beta lactamase (ESBL) producing Escherichia coli 09/20/2017   Vitamin D deficiency 07/18/2014   Wears glasses     Past Surgical History:  Procedure Laterality Date   BREAST LUMPECTOMY Left 06/2017   BREAST LUMPECTOMY WITH RADIOACTIVE SEED AND SENTINEL LYMPH NODE BIOPSY Left 06/29/2017   Procedure: LEFT BREAST LUMPECTOMY WITH RADIOACTIVE SEED AND LEFT AXILLARY SENTINEL LYMPH NODE BIOPSY ERAS PATHWAY;  Surgeon: Emelia Loron, MD;  Location: [facility];  Service: General;  Laterality: Left;   COLONOSCOPY  02/2016   polyps; Dr. Stan Head   EVACUATION BREAST HEMATOMA Left 07/11/2017   Procedure: EVACUATION HEMATOMA BREAST;  Surgeon: Romie Levee, MD;  Location: [facility];  Service: General;  Laterality: Left;   FOOT TENDON SURGERY Left 2013   NAVEL REMOVED     AS A CHILD   OTHER SURGICAL HISTORY     biopsy for possible sarcoidosis   POLYPECTOMY     SKIN BIOPSY     TONSILLECTOMY     TOTAL ABDOMINAL HYSTERECTOMY  2002   total; Dr. Richarda Overlie   WISDOM TOOTH EXTRACTION      Prior to Admission medications   Medication Sig Start Date End Date Taking? Authorizing Provider  adalimumab (HUMIRA, 2 PEN,) 40 MG/0.4ML pen Inject 0.4 mLs (40 mg total) into the skin every 14 (fourteen) days. To start after starter kit 03/09/23  Yes Iva Boop, MD  amLODipine-olmesartan (AZOR) 5-20 MG tablet TAKE 1 TABLET BY MOUTH EVERY DAY 05/27/23  Yes Tysinger, Kermit Balo, PA-C  Ascorbic Acid (VITAMIN C) 100 MG tablet Take 100 mg by mouth daily.   Yes [provider]  Cholecalciferol (VITAMIN D) 50 MCG (2000 UT) CAPS Take 1 capsule (2,000 Units total) by mouth daily. 11/02/22  Yes Tysinger,  Kermit Balo, PA-C  fenofibrate (TRICOR) 145 MG tablet Take 1 tablet (145 mg total) by mouth daily. 08/02/23 08/01/24 Yes Tysinger, Kermit Balo, PA-C  albuterol (VENTOLIN HFA) 108 (90 Base) MCG/ACT inhaler TAKE 2 PUFFS BY MOUTH EVERY 6 HOURS AS NEEDED FOR WHEEZE OR SHORTNESS OF BREATH Patient not taking: Reported on 05/25/2023 05/17/20   Tysinger, Kermit Balo, PA-C  ciprofloxacin (CIPRO) 500 MG tablet Take 1 tablet (500 mg total) by mouth 2 (two) times daily. Take 1 tab every 12 hours. Take with food. For infection. Patient not taking: Reported on 08/20/2023 07/09/23   Tollie Eth, NP  diphenoxylate-atropine (LOMOTIL) 2.5-0.025 MG tablet 1-2 every 6 hours prn diarrhea Patient not taking: Reported on 05/25/2023 07/11/21   Iva Boop, MD  levocetirizine (XYZAL) 5 MG  tablet Take 1 tablet (5 mg total) by mouth every evening. Post nasal drip. 07/09/23   Tollie Eth, NP  Multiple Vitamins-Iron (MULTIVITAMINS WITH IRON) TABS tablet Take 1 tablet by mouth daily. Patient not taking: Reported on 05/25/2023    [provider]  oseltamivir (TAMIFLU) 75 MG capsule Take 1 capsule (75 mg total) by mouth 2 (two) times daily. Take BID for 5 days.  Take with food. 07/09/23   Tollie Eth, NP  predniSONE (DELTASONE) 20 MG tablet Take 1 tablet (20 mg total) by mouth daily with breakfast. 07/09/23   Early, Sung Amabile, NP    Current Outpatient Medications  Medication Sig Dispense Refill   adalimumab (HUMIRA, 2 PEN,) 40 MG/0.4ML pen Inject 0.4 mLs (40 mg total) into the skin every 14 (fourteen) days. To start after starter kit 2 each 5   amLODipine-olmesartan (AZOR) 5-20 MG tablet TAKE 1 TABLET BY MOUTH EVERY DAY 90 tablet 0   Ascorbic Acid (VITAMIN C) 100 MG tablet Take 100 mg by mouth daily.     Cholecalciferol (VITAMIN D) 50 MCG (2000 UT) CAPS Take 1 capsule (2,000 Units total) by mouth daily. 90 capsule 3   fenofibrate (TRICOR) 145 MG tablet Take 1 tablet (145 mg total) by mouth daily. 90 tablet 0   albuterol (VENTOLIN HFA) 108 (90 Base) MCG/ACT inhaler TAKE 2 PUFFS BY MOUTH EVERY 6 HOURS AS NEEDED FOR WHEEZE OR SHORTNESS OF BREATH (Patient not taking: Reported on 05/25/2023) 60 g 0   ciprofloxacin (CIPRO) 500 MG tablet Take 1 tablet (500 mg total) by mouth 2 (two) times daily. Take 1 tab every 12 hours. Take with food. For infection. (Patient not taking: Reported on 08/20/2023) 10 tablet 0   diphenoxylate-atropine (LOMOTIL) 2.5-0.025 MG tablet 1-2 every 6 hours prn diarrhea (Patient not taking: Reported on 05/25/2023) 60 tablet 1   levocetirizine (XYZAL) 5 MG tablet Take 1 tablet (5 mg total) by mouth every evening. Post nasal drip. 30 tablet 3   Multiple Vitamins-Iron (MULTIVITAMINS WITH IRON) TABS tablet Take 1 tablet by mouth daily. (Patient not taking: Reported on  05/25/2023)     oseltamivir (TAMIFLU) 75 MG capsule Take 1 capsule (75 mg total) by mouth 2 (two) times daily. Take BID for 5 days.  Take with food. 10 capsule 0   predniSONE (DELTASONE) 20 MG tablet Take 1 tablet (20 mg total) by mouth daily with breakfast. 5 tablet 0   Current Facility-Administered Medications  Medication Dose Route Frequency Provider Last Rate Last Admin   0.9 %  sodium chloride infusion  500 mL Intravenous Once Iva Boop, MD        Allergies as of 08/20/2023   (No Known Allergies)  Family History  Problem Relation Age of Onset   Hypertension Mother    Aneurysm Mother        brain   Alzheimer's disease Mother    Heart disease Mother    Asthma Father    Heart disease Father 14       died of MI mid '76s   Hypertension Sister    Asthma Sister    Atrial fibrillation Sister    Atrial fibrillation Nephew    Cancer Neg Hx    Stroke Neg Hx    Diabetes Neg Hx    Colon cancer Neg Hx    Colon polyps Neg Hx    Crohn's disease Neg Hx    Ulcerative colitis Neg Hx    Hypercalcemia Neg Hx     Social History   Socioeconomic History   Marital status: Divorced    Spouse name: Not on file   Number of children: 2   Years of education: Not on file   Highest education level: Not on file  Occupational History   Occupation: retired [place] [employer]  Tobacco Use   Smoking status: Former    Current packs/day: 0.00    Types: Cigarettes    Quit date: 06/15/1988    Years since quitting: 35.2   Smokeless tobacco: Never  Vaping Use   Vaping status: Never Used  Substance and Sexual Activity   Alcohol use: Yes    Alcohol/week: 0.0 standard drinks of alcohol    Comment: rarely   Drug use: No   Sexual activity: Not on file  Other Topics Concern   Not on file  Social History Narrative   Lives with her son, has 2 adult children, not married,    Retired from the [employer]   Exercise 6 days per week.     Social Drivers of Corporate investment banker Strain:  Not on file  Food Insecurity: Not on file  Transportation Needs: Not on file  Physical Activity: Not on file  Stress: Not on file  Social Connections: Not on file  Intimate Partner Violence: Not on file    Review of Systems:  All other review of systems negative except as mentioned in the HPI.  Physical Exam: Vital signs BP (!) 147/92   Pulse 81   Temp 98.2 F (36.8 C)   Ht 5\' 10"  (1.778 m)   Wt 238 lb (108 kg)   SpO2 98%   BMI 34.15 kg/m   General:   Alert,  Well-developed, well-nourished, pleasant and cooperative in NAD Lungs:  Clear throughout to auscultation.   Heart:  Regular rate and rhythm; no murmurs, clicks, rubs,  or gallops. Abdomen:  Soft, nontender and nondistended. Normal bowel sounds.   Neuro/Psych:  Alert and cooperative. Normal mood and affect. A and O x 3   @Jaegar Croft  Sena Slate, MD, [facility] [phone] (pager) 08/20/2023 1:32 PM@

## 2023-08-23 ENCOUNTER — Ambulatory Visit: Admitting: Medical

## 2023-08-23 ENCOUNTER — Telehealth: Payer: Self-pay

## 2023-08-23 VITALS — BP 110/70 | HR 87 | Wt 230.6 lb

## 2023-08-23 DIAGNOSIS — I2510 Atherosclerotic heart disease of native coronary artery without angina pectoris: Secondary | ICD-10-CM | POA: Diagnosis not present

## 2023-08-23 DIAGNOSIS — E785 Hyperlipidemia, unspecified: Secondary | ICD-10-CM

## 2023-08-23 DIAGNOSIS — I10 Essential (primary) hypertension: Secondary | ICD-10-CM

## 2023-08-23 DIAGNOSIS — I77819 Aortic ectasia, unspecified site: Secondary | ICD-10-CM

## 2023-08-23 NOTE — Telephone Encounter (Signed)
  Follow up Call-     08/20/2023    1:16 PM  Call back number  Post procedure Call Back phone  # [phone]  Permission to leave phone message Yes     Patient questions:  Do you have a fever, pain , or abdominal swelling? No. Pain Score  0 *  Have you tolerated food without any problems? Yes.    Have you been able to return to your normal activities? Yes.    Do you have any questions about your discharge instructions: Diet   No. Medications  No. Follow up visit  No.  Do you have questions or concerns about your Care? No.  Actions: * If pain score is 4 or above: No action needed, pain <4.

## 2023-08-23 NOTE — Progress Notes (Signed)
 Subjective:  Katie Robinson is a 68 y.o. female who presents for Chief Complaint  Patient presents with   follow-up    Follow-up on cholesterol, had an apple at 2pm and go over CT scan     Here to follow-up on recent initiation of fenofibrate, labs done recently as well as CT coronary test in February 2025  She is compliant with medications.  No particular issues today  No other aggravating or relieving factors.    No other c/o.   Past Medical History:  Diagnosis Date   Allergy    SEASONAL   Anemia    Arthralgia    Asthma    controlled   Breast cancer, left breast (HCC) 05/2017   left breast cancer   Chronic headache    many years   COVID-19 virus infection 06/2020   Family history of premature coronary artery disease    father   Former smoker    10 pack year history   GERD (gastroesophageal reflux disease)    mild, occasional   H/O bone density study 11/2015   normal   H/O cardiovascular stress test 2009   History of radiation therapy 08/12/17- 09/08/17   Left Breast 40.05 Gy in 15 fractions, Left Breast Boost/ 10 Gy in 5 fractions.    Hx of adenomatous and sessile serrated colonic polyps 05/24/2014   Iron deficiency 11/29/2015   Obesity    Pectus excavatum    mild, left asymmetric   Personal history of radiation therapy    S/P hysterectomy    due to fibroids   Seasonal allergic rhinitis    UC (ulcerative colitis) (HCC) 2015   Dr. Leone Payor   Urinary tract infection 2013   UTI due to extended-spectrum beta lactamase (ESBL) producing Escherichia coli 09/20/2017   Vitamin D deficiency 07/18/2014   Wears glasses    Current Outpatient Medications on File Prior to Visit  Medication Sig Dispense Refill   adalimumab (HUMIRA, 2 PEN,) 40 MG/0.4ML pen Inject 0.4 mLs (40 mg total) into the skin every 14 (fourteen) days. To start after starter kit 2 each 5   albuterol (VENTOLIN HFA) 108 (90 Base) MCG/ACT inhaler TAKE 2 PUFFS BY MOUTH EVERY 6 HOURS AS NEEDED FOR WHEEZE OR  SHORTNESS OF BREATH 60 g 0   amLODipine-olmesartan (AZOR) 5-20 MG tablet TAKE 1 TABLET BY MOUTH EVERY DAY 90 tablet 0   Ascorbic Acid (VITAMIN C) 100 MG tablet Take 100 mg by mouth daily.     Cholecalciferol (VITAMIN D) 50 MCG (2000 UT) CAPS Take 1 capsule (2,000 Units total) by mouth daily. 90 capsule 3   fenofibrate (TRICOR) 145 MG tablet Take 1 tablet (145 mg total) by mouth daily. 90 tablet 0   Ferrous Fumarate (HEMOCYTE - 106 MG FE) 324 (106 Fe) MG TABS tablet Take 1 tablet (106 mg of iron total) by mouth every other day.     diphenoxylate-atropine (LOMOTIL) 2.5-0.025 MG tablet 1-2 every 6 hours prn diarrhea (Patient not taking: Reported on 08/23/2023) 60 tablet 1   levocetirizine (XYZAL) 5 MG tablet Take 1 tablet (5 mg total) by mouth every evening. Post nasal drip. (Patient not taking: Reported on 08/23/2023) 30 tablet 3   Multiple Vitamins-Iron (MULTIVITAMINS WITH IRON) TABS tablet Take 1 tablet by mouth daily. (Patient not taking: Reported on 08/23/2023)     No current facility-administered medications on file prior to visit.     The following portions of the patient's history were reviewed and updated as appropriate: allergies,  current medications, past family history, past medical history, past social history, past surgical history and problem list.  ROS Otherwise as in subjective above  Objective: BP 110/70   Pulse 87   Wt 230 lb 9.6 oz (104.6 kg)   BMI 33.09 kg/m   General appearance: alert, no distress, well developed, well nourished   CT coronary 08/09/2023 IMPRESSION: 1. Coronary calcium score of 36.7. This was 70 percentile for age-, race-, and sex-matched controls.   2. Ascending Aorta: Mildly dilated, 41 mm.   3. Aortic atherosclerosis   4. Mitral annular calcification - moderate   Echocardiogram 05/26/2023 IMPRESSIONS   1. Left ventricular ejection fraction, by estimation, is 60 to 65%. The  left ventricle has normal function. The left ventricle has no  regional  wall motion abnormalities. There is mild concentric left ventricular  hypertrophy. Left ventricular diastolic  parameters are consistent with Grade I diastolic dysfunction (impaired  relaxation).   2. Right ventricular systolic function is normal. The right ventricular  size is normal. There is normal pulmonary artery systolic pressure. The  estimated right ventricular systolic pressure is 27.5 mmHg.   3. The mitral valve is normal in structure. Trivial mitral valve  regurgitation. No evidence of mitral stenosis.   4. The aortic valve is tricuspid. Aortic valve regurgitation is mild. No  aortic stenosis is present.   5. Aortic dilatation noted. There is mild dilatation of the ascending  aorta, measuring 40 mm.   6. The inferior vena cava is normal in size with <50% respiratory  variability, suggesting right atrial pressure of 8 mmHg.    Assessment: Encounter Diagnoses  Name Primary?   Hyperlipidemia, unspecified hyperlipidemia type Yes   Aortic dilatation (HCC)    Coronary artery calcification seen on CT scan    Essential hypertension      Plan: Discussed her recent CT scan coronary, labs from 07/2023 and risks, including family history of premature CAD  Discuss diet, exercise.  She is doing well with lifestyle.   Discussed medicaiton option.    We discussed different medication options different categories of lipid-lowering medications.  She has been reluctant to use a statin.  She just started a trial of fenofibrate.  She will recheck in 6 weeks fasting.  We discussed possibly doing a trial of fenofibrate or every 2-week injectable such as Repatha or Praluent if lipids are not improved on the fenofibrate  Aortic dilatation noted on echocardiogram from December 2024 and the CT coronary from February 2025.  She will have follow-up with cardiology in a year and repeat echocardiogram then.   Katie Robinson was seen today for follow-up.  Diagnoses and all orders for this  visit:  Hyperlipidemia, unspecified hyperlipidemia type -     Lipid panel; Future -     ALT; Future  Aortic dilatation (HCC)  Coronary artery calcification seen on CT scan  Essential hypertension    Follow up: 6wk for fasting labs

## 2023-08-25 LAB — SURGICAL PATHOLOGY

## 2023-08-26 ENCOUNTER — Other Ambulatory Visit: Payer: Self-pay | Admitting: Medical

## 2023-08-27 ENCOUNTER — Encounter: Payer: Self-pay | Admitting: Internal Medicine

## 2023-09-01 ENCOUNTER — Encounter: Payer: Self-pay | Admitting: Cardiology

## 2023-09-03 ENCOUNTER — Other Ambulatory Visit: Payer: Self-pay | Admitting: Internal Medicine

## 2023-10-08 ENCOUNTER — Other Ambulatory Visit

## 2023-10-08 DIAGNOSIS — E785 Hyperlipidemia, unspecified: Secondary | ICD-10-CM

## 2023-10-09 LAB — LIPID PANEL
Chol/HDL Ratio: 2.1 ratio (ref 0.0–4.4)
Cholesterol, Total: 197 mg/dL (ref 100–199)
HDL: 92 mg/dL (ref >39)
LDL Chol Calc (NIH): 96 mg/dL (ref 0–99)
Triglycerides: 48 mg/dL (ref 0–149)
VLDL Cholesterol Cal: 9 mg/dL (ref 5–40)

## 2023-10-09 LAB — ALT: ALT: 10 IU/L (ref 0–32)

## 2023-10-11 ENCOUNTER — Other Ambulatory Visit: Payer: Self-pay | Admitting: Medical

## 2023-10-11 MED ORDER — FENOFIBRATE 145 MG PO TABS: 145.0000 mg | ORAL_TABLET | Freq: Every day | ORAL | 3 refills | Status: DC

## 2023-10-11 MED ORDER — VITAMIN D 50 MCG (2000 UT) PO CAPS: 1.0000 | ORAL_CAPSULE | Freq: Every day | ORAL | 3 refills | Status: AC

## 2023-10-11 NOTE — Progress Notes (Signed)
 Are you taking and tolerating the fenofibrate ?  Your liver test looks fine and your cholesterol numbers look significantly improved  If you are taking the medication it appears to be working.  I can send this to the pharmacy for regular refill.

## 2023-10-22 ENCOUNTER — Other Ambulatory Visit: Payer: Self-pay | Admitting: Medical

## 2023-10-22 DIAGNOSIS — Z1231 Encounter for screening mammogram for malignant neoplasm of breast: Secondary | ICD-10-CM

## 2023-10-29 ENCOUNTER — Ambulatory Visit

## 2023-10-29 ENCOUNTER — Ambulatory Visit
Admission: RE | Admit: 2023-10-29 | Discharge: 2023-10-29 | Disposition: A | Discharge status: Home / Self Care | Source: Ambulatory Visit | Attending: Medical | Admitting: Medical

## 2023-10-29 DIAGNOSIS — Z1231 Encounter for screening mammogram for malignant neoplasm of breast: Secondary | ICD-10-CM

## 2023-11-03 ENCOUNTER — Ambulatory Visit (INDEPENDENT_AMBULATORY_CARE_PROVIDER_SITE_OTHER): Payer: Medicare Other | Admitting: Medical

## 2023-11-03 ENCOUNTER — Other Ambulatory Visit: Payer: Self-pay | Admitting: Medical

## 2023-11-03 ENCOUNTER — Encounter: Payer: Self-pay | Admitting: Medical

## 2023-11-03 VITALS — BP 118/78 | HR 91 | Wt 233.8 lb

## 2023-11-03 DIAGNOSIS — I10 Essential (primary) hypertension: Secondary | ICD-10-CM | POA: Diagnosis not present

## 2023-11-03 DIAGNOSIS — Z0000 Encounter for general adult medical examination without abnormal findings: Secondary | ICD-10-CM

## 2023-11-03 DIAGNOSIS — Z7185 Encounter for immunization safety counseling: Secondary | ICD-10-CM

## 2023-11-03 DIAGNOSIS — E210 Primary hyperparathyroidism: Secondary | ICD-10-CM | POA: Diagnosis not present

## 2023-11-03 DIAGNOSIS — R7301 Impaired fasting glucose: Secondary | ICD-10-CM | POA: Insufficient documentation

## 2023-11-03 DIAGNOSIS — Z7960 Long term (current) use of unspecified immunomodulators and immunosuppressants: Secondary | ICD-10-CM

## 2023-11-03 DIAGNOSIS — J4540 Moderate persistent asthma, uncomplicated: Secondary | ICD-10-CM

## 2023-11-03 DIAGNOSIS — K5150 Left sided colitis without complications: Secondary | ICD-10-CM

## 2023-11-03 DIAGNOSIS — E559 Vitamin D deficiency, unspecified: Secondary | ICD-10-CM

## 2023-11-03 DIAGNOSIS — Z853 Personal history of malignant neoplasm of breast: Secondary | ICD-10-CM

## 2023-11-03 DIAGNOSIS — J309 Allergic rhinitis, unspecified: Secondary | ICD-10-CM | POA: Insufficient documentation

## 2023-11-03 MED ORDER — ALBUTEROL SULFATE HFA 108 (90 BASE) MCG/ACT IN AERS: INHALATION_SPRAY | RESPIRATORY_TRACT | 0 refills | Status: DC

## 2023-11-03 MED ORDER — LEVOCETIRIZINE DIHYDROCHLORIDE 5 MG PO TABS: 5.0000 mg | ORAL_TABLET | Freq: Every evening | ORAL | 1 refills | Status: DC

## 2023-11-03 NOTE — Progress Notes (Signed)
 Subjective: Chief Complaint  Patient presents with   Annual Exam    Cpe wellness exam has no concerns    Medical team: Dr. Asencion Blacksmith, medical oncology Dr. Colie Dawes, radiation oncology Dr. Loy Ruff, GI Dr. Donna Fus, [facility] Dr. Gwyndolyn Lerner, endocrinology Dr. Virgina Grills, [facility] Eye doctor Dentist Edessa Jakubowicz, Christiane Cowing, PA-C for primary care   Concerns:  She has a little bit of congestion and sniffles.  She takes her iron daily.  She uses Benefiber for constipation.  Nonfasting today.  She also screening for memory.  She has no major concerns but just wants to do a little bit of screening.  Her sister has memory concerns and her mother had dementia.  She has not completed advanced directives yet.  No recent problems with asthma  Past Medical History:  Diagnosis Date   Allergy    SEASONAL   Anemia    Arthralgia    Asthma    controlled   Breast cancer, left breast (HCC) 05/2017   left breast cancer   Chronic headache    many years   COVID-19 virus infection 06/2020   Family history of premature coronary artery disease    father   Former smoker    10 pack year history   GERD (gastroesophageal reflux disease)    mild, occasional   H/O bone density study 11/2015   normal   H/O cardiovascular stress test 2009   History of radiation therapy 08/12/17- 09/08/17   Left Breast 40.05 Gy in 15 fractions, Left Breast Boost/ 10 Gy in 5 fractions.    Hx of adenomatous and sessile serrated colonic polyps 05/24/2014   Iron deficiency 11/29/2015   Obesity    Pectus excavatum    mild, left asymmetric   Personal history of radiation therapy    S/P hysterectomy    due to fibroids   Seasonal allergic rhinitis    UC (ulcerative colitis) (HCC) 2015   Dr. Willy Harvest   Urinary tract infection 2013   UTI due to extended-spectrum beta lactamase (ESBL) producing Escherichia coli 09/20/2017   Vitamin D  deficiency 07/18/2014   Wears glasses     Past Surgical  History:  Procedure Laterality Date   BREAST LUMPECTOMY Left 06/2017   BREAST LUMPECTOMY WITH RADIOACTIVE SEED AND SENTINEL LYMPH NODE BIOPSY Left 06/29/2017   Procedure: LEFT BREAST LUMPECTOMY WITH RADIOACTIVE SEED AND LEFT AXILLARY SENTINEL LYMPH NODE BIOPSY ERAS PATHWAY;  Surgeon: Enid Harry, MD;  Location: [facility];  Service: General;  Laterality: Left;   COLONOSCOPY  02/2016   polyps; Dr. Loy Ruff   EVACUATION BREAST HEMATOMA Left 07/11/2017   Procedure: EVACUATION HEMATOMA BREAST;  Surgeon: Joyce Nixon, MD;  Location: [facility];  Service: General;  Laterality: Left;   FOOT TENDON SURGERY Left 2013   NAVEL REMOVED     AS A CHILD   OTHER SURGICAL HISTORY     biopsy for possible sarcoidosis   POLYPECTOMY     SKIN BIOPSY     TONSILLECTOMY     TOTAL ABDOMINAL HYSTERECTOMY  2002   total; Dr. Woodrow Hazy   WISDOM TOOTH EXTRACTION       Family History  Problem Relation Age of Onset   Hypertension Mother    Aneurysm Mother        brain   Alzheimer's disease Mother    Heart disease Mother    Asthma Father    Heart disease Father 64       died of  MI mid '40s   Hypertension Sister    Asthma Sister    Atrial fibrillation Sister    Atrial fibrillation Nephew    Cancer Neg Hx    Stroke Neg Hx    Diabetes Neg Hx    Colon cancer Neg Hx    Colon polyps Neg Hx    Crohn's disease Neg Hx    Ulcerative colitis Neg Hx    Hypercalcemia Neg Hx      Current Outpatient Medications:    adalimumab  (HUMIRA , 2 PEN,) 40 MG/0.4ML pen, Inject 0.4 mLs (40 mg total) into the skin every 14 (fourteen) days. To start after starter kit, Disp: 2 each, Rfl: 5   amLODipine -olmesartan  (AZOR ) 5-20 MG tablet, TAKE 1 TABLET BY MOUTH EVERY DAY, Disp: 90 tablet, Rfl: 1   Ascorbic Acid (VITAMIN C) 100 MG tablet, Take 100 mg by mouth daily., Disp: , Rfl:    Cholecalciferol  (VITAMIN D ) 50 MCG (2000 UT) CAPS, Take 1 capsule (2,000 Units total) by mouth daily., Disp: 90 capsule,  Rfl: 3   fenofibrate  (TRICOR ) 145 MG tablet, Take 1 tablet (145 mg total) by mouth daily., Disp: 90 tablet, Rfl: 3   Ferrous Fumarate  (HEMOCYTE - 106 MG FE) 324 (106 Fe) MG TABS tablet, Take 1 tablet (106 mg of iron total) by mouth every other day., Disp: , Rfl:    HUMIRA , 2 SYRINGE, 40 MG/0.4ML prefilled syringe, INJECT 40 MG (0.4 ML) UNDER THE SKIN EVERY 14 DAYS, TO START AFTER STARTER KIT, Disp: 2 each, Rfl: 6   levocetirizine (XYZAL ) 5 MG tablet, Take 1 tablet (5 mg total) by mouth every evening., Disp: 90 tablet, Rfl: 1   albuterol  (VENTOLIN  HFA) 108 (90 Base) MCG/ACT inhaler, TAKE 2 PUFFS BY MOUTH EVERY 6 HOURS AS NEEDED FOR WHEEZE OR SHORTNESS OF BREATH, Disp: 60 g, Rfl: 0   diphenoxylate -atropine  (LOMOTIL ) 2.5-0.025 MG tablet, 1-2 every 6 hours prn diarrhea (Patient not taking: Reported on 11/03/2023), Disp: 60 tablet, Rfl: 1   levocetirizine (XYZAL ) 5 MG tablet, Take 1 tablet (5 mg total) by mouth every evening. Post nasal drip. (Patient not taking: Reported on 11/03/2023), Disp: 30 tablet, Rfl: 3   Multiple Vitamins-Iron (MULTIVITAMINS WITH IRON) TABS tablet, Take 1 tablet by mouth daily. (Patient not taking: Reported on 11/03/2023), Disp: , Rfl:   No Known Allergies    Objective: BP 118/78   Pulse 91   Wt 233 lb 12.8 oz (106.1 kg)   SpO2 96%   BMI 33.55 kg/m   Wt Readings from Last 3 Encounters:  11/03/23 233 lb 12.8 oz (106.1 kg)  08/23/23 230 lb 9.6 oz (104.6 kg)  08/20/23 238 lb (108 kg)   BP Readings from Last 3 Encounters:  11/03/23 118/78  08/23/23 110/70  08/20/23 (!) 151/69    General appearance: alert, no distress, WD/WN, AA female HEENT: normocephalic, sclerae anicteric, PERRLA, EOMi, nares with turbinate edema, no discharge, TMs with air-fluid levels bilaterally, no erythema,, pharynx normal Oral cavity: MMM, no lesions Neck: supple, no lymphadenopathy, no thyromegaly, no masses, no bruits There is a chest deformity of her left upper chest at the [facility] joint where  there is a depression or divot in the chest wall that is unchanged from childhood per patient, excavitum small deformity Heart: RRR, normal S1, S2, no murmurs Lungs: CTA bilaterally, no wheezes, rhonchi, or rales Abdomen: +bs, soft, non tender, non distended, no masses, no hepatomegaly, no splenomegaly Back: non tender Musculoskeletal: nontender, no swelling, no obvious deformity Extremities: no edema,  no cyanosis, no clubbing Pulses: 2+ symmetric, upper and lower extremities, normal cap refill Neurological: alert, oriented x 3, CN2-12 intact, strength normal upper extremities and lower extremities, sensation normal throughout, DTRs 2+ throughout, no cerebellar signs, gait normal Psychiatric: normal affect, behavior normal, pleasant   Breast: deferred/declined Gyn/rectal - deferred/declined   Assessment: Encounter Diagnoses  Name Primary?   Encounter for health maintenance examination in adult Yes   Essential hypertension    History of breast cancer    Hyperparathyroidism, primary (HCC)    Vaccine counseling    Vitamin D  deficiency    Moderate persistent asthma without complication    Long-term use of immunosuppressant medication - Humira     Left sided ulcerative (chronic) colitis (HCC)    Impaired fasting blood sugar    Allergic rhinitis, unspecified seasonality, unspecified trigger        Plan: Today you had a preventative care visit or wellness visit.    Topics today may have included healthy lifestyle, diet, exercise, preventative care, vaccinations, sick and well care, proper use of emergency dept and after hours care, as well as other concerns.     Recommendations: Continue to return yearly for your annual wellness and preventative care visits.  This gives us  a chance to discuss healthy lifestyle, exercise, vaccinations, review your chart record, and perform screenings where appropriate.  I recommend you see your eye doctor yearly for routine vision care.  I recommend  you see your dentist yearly for routine dental care including hygiene visits twice yearly.  See your specialists as usual   Vaccination recommendations were reviewed Immunization History  Administered Date(s) Administered   Influenza,inj,Quad PF,6+ Mos 05/30/2013, 04/22/2016, 05/10/2017, 04/21/2018, 04/30/2021   Influenza,inj,quad, With Preservative 03/19/2019   PFIZER(Purple Top)SARS-COV-2 Vaccination 08/18/2019, 09/18/2019, 03/26/2020   PNEUMOCOCCAL CONJUGATE-20 11/02/2022   Pfizer Covid-19 Vaccine Bivalent Booster 45yrs & up 04/30/2021   Pneumococcal Conjugate-13 05/30/2013, 11/26/2015   Pneumococcal Polysaccharide-23 09/16/2017   Tdap 05/30/2013   Zoster Recombinant(Shingrix ) 07/01/2018, 09/12/2018   Get your Tdap tetanus booster at the pharmacy soon   Screening for cancer: Breast cancer screening: You should perform a self breast exam monthly.   We reviewed recommendations for regular mammograms and breast cancer screening.  Get your mammogram yearly  Colon cancer screening:  I reviewed your colonoscopy on file that is up to date from 08/2023, repeat in 3 years  Cervical cancer screening: You are status post hysterectomy  Skin cancer screening: Check your skin regularly for new changes, growing lesions, or other lesions of concern Come in for evaluation if you have skin lesions of concern.  Lung cancer screening: If you have a greater than 30 pack year history of tobacco use, then you qualify for lung cancer screening with a chest CT scan  We currently don't have screenings for other cancers besides breast, cervical, colon, and lung cancers.  If you have a strong family history of cancer or have other cancer screening concerns, please let me know.    Bone health: Get at least 150 minutes of aerobic exercise weekly Get weight bearing exercise at least once weekly I reviewed the 06/2020 bone density test with normal result!  Heart health: Get at least 150 minutes of  aerobic exercise weekly Limit alcohol It is important to maintain a healthy blood pressure and healthy cholesterol numbers   Advanced Directives: I recommend you consider completing a Health Care Power of Attorney and Living Will.   These documents respect your wishes and help alleviate burdens on your  loved ones if you were to become terminally ill or be in a position to need those documents enforced.    You can complete Advanced Directives yourself, have them notarized, then have copies made for our office, for you and for anybody you feel should have them in safe keeping.  Or, you can have an attorney prepare these documents.   If you haven't updated your Last Will and Testament in a while, it may be worthwhile having an attorney prepare these documents together and save on some costs.       Separate significant issues discussed: Hypertension-continue current medication  History of breast cancer-continue routine mammogram  History of ulcerative colitis-managed by gastroenterology  Vitamin D  deficiency-continue supplement  Alkaline phosphatase elevated-managed by endocrinology  Asthma-continue albuterol  as needed.    Allergic rhinitis-begin back on Xyzal  at least for the next couple weeks  Shonta was seen today for annual exam.  Diagnoses and all orders for this visit:  Encounter for health maintenance examination in adult -     Basic metabolic panel with GFR; Future -     Hemoglobin A1c; Future -     TSH + free T4; Future  Essential hypertension  History of breast cancer  Hyperparathyroidism, primary (HCC)  Vaccine counseling  Vitamin D  deficiency  Moderate persistent asthma without complication  Long-term use of immunosuppressant medication - Humira   Left sided ulcerative (chronic) colitis (HCC)  Impaired fasting blood sugar -     Hemoglobin A1c; Future  Allergic rhinitis, unspecified seasonality, unspecified trigger  Other orders -     levocetirizine  (XYZAL ) 5 MG tablet; Take 1 tablet (5 mg total) by mouth every evening. -     albuterol  (VENTOLIN  HFA) 108 (90 Base) MCG/ACT inhaler; TAKE 2 PUFFS BY MOUTH EVERY 6 HOURS AS NEEDED FOR WHEEZE OR SHORTNESS OF BREATH    Spent > 45 minutes face to face with patient in discussion of symptoms, evaluation, plan and recommendations.    F/u pending labs

## 2023-11-04 ENCOUNTER — Ambulatory Visit: Payer: Self-pay | Admitting: Medical

## 2023-11-12 ENCOUNTER — Other Ambulatory Visit: Payer: Self-pay | Admitting: Medical

## 2023-11-12 MED ORDER — FENOFIBRATE 145 MG PO TABS: 145.0000 mg | ORAL_TABLET | Freq: Every day | ORAL | 1 refills | Status: DC

## 2023-11-12 NOTE — Telephone Encounter (Signed)
 Copied from CRM [id number]. Topic: Clinical - Prescription Issue >> Nov 12, 2023 11:20 AM Donald Frost wrote: Reason for CRM: The patient called in stating she never got her fenofibrate  (TRICOR ) 145 MG tablet 90 tablet 3 10/11/2023 10/10/2024  Sig - Route: Take 1 tablet (145 mg total) by mouth daily. - Oral   That was sent to [facility] last month as they no longer cover her medications. [facility] off [facility] preferred [facility] list. She would like this sent to   [id number] - [place], [place] - [address]  Phone: [phone] Fax: [phone]  As she states she only has 4 left and takes one a day which will get her through Monday. Please assist patient as soon as possible.

## 2023-11-24 ENCOUNTER — Telehealth: Payer: Self-pay

## 2023-11-25 NOTE — Telephone Encounter (Signed)
 Ordering provider: Dr. Emmette Harms Associated diagnoses:  R40.0 (Somnolence) I10 (Essential Hypertension) R06.83 (Snoring) WatchPAT PA obtained on 11/24/2023 by Lodema Rimes, CMA. Authorization: No PA required per [facility] Provider portal: Prior Authorization/Notification is not required for the requested service(s). Decision ID #: [id number] Patient notified of PIN (1234) on 11/24/2023 via Notification Method: MyChart message.  Phone note routed to covering staff for follow-up.

## 2024-01-04 NOTE — Telephone Encounter (Signed)
 Called pt, let here know that pin number since she has not read the MyChart message yet. Pt thankful for the call, will do sleep study tonight.

## 2024-01-20 ENCOUNTER — Telehealth: Payer: Self-pay | Admitting: Internal Medicine

## 2024-01-20 DIAGNOSIS — K5150 Left sided colitis without complications: Secondary | ICD-10-CM

## 2024-01-20 NOTE — Telephone Encounter (Signed)
 PT is having a UC flare and would like to discuss options for relief. Please advise.

## 2024-01-20 NOTE — Telephone Encounter (Signed)
 Spoke with patient, she believes she is having a flair of her UC. States she is noticing occasional bilateral lower abdominal pain mostly occurring after meals. She reports urgency with her bms. States anytime she goes to the bathroom, she is expelling fluid from the rectum even if she is not actively having a bm. Reports pinkish colored stools and it is present on the toilet paper as well. Patient also reports constipation. She feels as though she can't have a full bm. She stopped taking her iron supplement 2 weeks ago, and she still feels like she has to strain to pass stool. Denies nausea, vomiting, and fevers.

## 2024-01-21 ENCOUNTER — Other Ambulatory Visit

## 2024-01-21 DIAGNOSIS — K5150 Left sided colitis without complications: Secondary | ICD-10-CM

## 2024-01-21 LAB — CBC WITH DIFFERENTIAL/PLATELET
Basophils Absolute: 0 K/uL (ref 0.0–0.1)
Basophils Relative: 0.6 % (ref 0.0–3.0)
Eosinophils Absolute: 0.3 K/uL (ref 0.0–0.7)
Eosinophils Relative: 4 % (ref 0.0–5.0)
HCT: 39.4 % (ref 36.0–46.0)
Hemoglobin: 13 g/dL (ref 12.0–15.0)
Lymphocytes Relative: 41.7 % (ref 12.0–46.0)
Lymphs Abs: 3.4 K/uL (ref 0.7–4.0)
MCHC: 33 g/dL (ref 30.0–36.0)
MCV: 86 fl (ref 78.0–100.0)
Monocytes Absolute: 0.8 K/uL (ref 0.1–1.0)
Monocytes Relative: 10.3 % (ref 3.0–12.0)
Neutro Abs: 3.6 K/uL (ref 1.4–7.7)
Neutrophils Relative %: 43.4 % (ref 43.0–77.0)
Platelets: 333 K/uL (ref 150.0–400.0)
RBC: 4.59 Mil/uL (ref 3.87–5.11)
RDW: 13.6 % (ref 11.5–15.5)
WBC: 8.3 K/uL (ref 4.0–10.5)

## 2024-01-21 LAB — SEDIMENTATION RATE: Sed Rate: 11 mm/h (ref 0–30)

## 2024-01-21 LAB — HIGH SENSITIVITY CRP: CRP, High Sensitivity: 0.89 mg/L (ref 0.000–5.000)

## 2024-01-21 MED ORDER — MESALAMINE 1000 MG RE SUPP: 1000.0000 mg | Freq: Every day | RECTAL | 1 refills | Status: DC

## 2024-01-21 NOTE — Telephone Encounter (Signed)
 She needs:  CBC, ESR, CRP and fecal calprotectin  Can see me 8/14 at 1010 it looks like   Start Canasa  suppository 1000 mg at bedtime # 30 no refill  Is she taking her Humira ?

## 2024-01-21 NOTE — Telephone Encounter (Signed)
 I was able to reach the pt and discuss recommendations She will come in for labs and stool test  She will pick up the Canasa  today Appt made for 8/14 at 1010 am with Dr Avram  She is taking Humira  and last dose was 01/17/24

## 2024-01-23 ENCOUNTER — Other Ambulatory Visit: Payer: Self-pay | Admitting: Medical

## 2024-01-23 ENCOUNTER — Ambulatory Visit: Payer: Self-pay | Admitting: Internal Medicine

## 2024-01-26 ENCOUNTER — Other Ambulatory Visit

## 2024-01-26 DIAGNOSIS — K5150 Left sided colitis without complications: Secondary | ICD-10-CM

## 2024-01-27 ENCOUNTER — Ambulatory Visit (INDEPENDENT_AMBULATORY_CARE_PROVIDER_SITE_OTHER): Admitting: Internal Medicine

## 2024-01-27 ENCOUNTER — Encounter: Payer: Self-pay | Admitting: Internal Medicine

## 2024-01-27 VITALS — BP 108/78 | HR 79 | Ht 70.0 in | Wt 231.4 lb

## 2024-01-27 DIAGNOSIS — Z7960 Long term (current) use of unspecified immunomodulators and immunosuppressants: Secondary | ICD-10-CM

## 2024-01-27 DIAGNOSIS — K51511 Left sided colitis with rectal bleeding: Secondary | ICD-10-CM

## 2024-01-27 DIAGNOSIS — K6289 Other specified diseases of anus and rectum: Secondary | ICD-10-CM

## 2024-01-27 DIAGNOSIS — K648 Other hemorrhoids: Secondary | ICD-10-CM

## 2024-01-27 DIAGNOSIS — E611 Iron deficiency: Secondary | ICD-10-CM

## 2024-01-27 NOTE — Patient Instructions (Signed)
 Please come 02/01/2024 to have your lab work done. The lab is open 7:30AM-5:30pm .   Stay off your iron.   _______________________________________________________  If your blood pressure at your visit was 140/90 or greater, please contact your primary care physician to follow up on this.  _______________________________________________________  If you are age 67 or older, your body mass index should be between 23-30. Your Body mass index is 33.2 kg/m. If this is out of the aforementioned range listed, please consider follow up with your Primary Care Provider.  If you are age 40 or younger, your body mass index should be between 19-25. Your Body mass index is 33.2 kg/m. If this is out of the aformentioned range listed, please consider follow up with your Primary Care Provider.   ________________________________________________________  The [facility] providers would like to encourage you to use MYCHART to communicate with providers for non-urgent requests or questions.  Due to long hold times on the telephone, sending your provider a message by [facility] may be a faster and more efficient way to get a response.  Please allow 48 business hours for a response.  Please remember that this is for non-urgent requests.  _______________________________________________________  [facility] is using a team-based approach to care.  Your team is made up of your doctor and two to three APPS. Our APPS (Nurse Practitioners and Physician Assistants) work with your physician to ensure care continuity for you. They are fully qualified to address your health concerns and develop a treatment plan. They communicate directly with your gastroenterologist to care for you. Seeing the Advanced Practice Practitioners on your physician's team can help you by facilitating care more promptly, often allowing for earlier appointments, access to diagnostic testing, procedures, and other specialty referrals.   I  appreciate the opportunity to care for you. Lupita Commander, MD, [facility]

## 2024-01-27 NOTE — Progress Notes (Signed)
 Katie Robinson 68 y.o. 17-Dec-1955 [id number]  Assessment & Plan:   Encounter Diagnoses  Name Primary?   Left sided ulcerative colitis with rectal bleeding (HCC) Yes   Long-term use of immunosuppressant medication - Humira     Iron deficiency        Left-sided ulcerative colitis flare  Current Humira  treatment may be ineffective due to low drug levels or antibody formation. Mesalamine  suppositories have provided some improvement. - Order Humira  trough level and antibodies test before next dose. - Continue mesalamine  suppositories. - Consider increasing Humira  dosing frequency to weekly if trough levels are low and no antibodies are present. - Consider changing medication if antibodies to Humira  are detected. - Follow-up fecal calprotectin that was submitted   Iron deficiency (without anemia) Iron deficiency without anemia. Iron supplement discontinued due to constipation concerns. Blood count normal, monitor iron levels. - Continue to hold iron supplement. - Reassess iron levels in the future.          Subjective:   Chief Complaint: ulcerative colitis flare  HPI Discussed the use of AI scribe software for clinical note transcription with the patient, who gave verbal consent to proceed.   Katie Robinson is a 68 year old female with left sided ulcerative colitis who presents with rectal bleeding and urgent defecation.  She has been experiencing rectal bleeding, urgent defecation, and leakage of fluid from the rectum since August 8th. Her stools are described as 'kind of pinkish' with blood on the toilet paper. Despite discontinuing her iron supplement, there was no improvement in her symptoms.  She has been using mesalamine  suppositories since late last week, which initially seemed to reduce the bleeding, but she noticed some bleeding again yesterday and this morning. She had formed stool on Tuesday, but generally experiences urgency with only liquid and blood being  expelled. These symptoms occur more frequently in the evenings, particularly after eating dinner.  1 or 2 times a day overall.    She continues to take Humira , with her last dose on August 4th and the next dose scheduled for the coming Monday.  She experiences a lot of gas and a sensation of it moving around, but denies having pain during defecation, although she sometimes feels nauseous.  She recalls a flare in 2017 when she was trying to start Humira  and is concerned about experiencing a similar flare-up.     Lab Results  Component Value Date   WBC 8.3 01/21/2024   HGB 13.0 01/21/2024   HCT 39.4 01/21/2024   MCV 86.0 01/21/2024   PLT 333.0 01/21/2024   High-sensitivity CRP [id number] 2025 Lab Results  Component Value Date   ESRSEDRATE 11 01/21/2024     Colonoscopy 08/20/2023 -Normal terminal ileum  the transverse colon, ascending colon and cecum are normal. Biopsied. - Left-sided colitis. Inflammation was found., unchanged compared to previous examinations. Biopsied. - The examination was otherwise normal on direct and retroflexion views.   1. Surgical [P], colon, hepatic flexure, ascending, and cecum :      UNREMARKABLE COLONIC MUCOSA WITH BENIGN LYMPHOID AGGREGATE.      NEGATIVE FOR ACTIVE INFLAMMATION, CHRONIC CHANGES OR GRANULOMAS.      NEGATIVE FOR DYSPLASIA.       2. Surgical [P], colon, proximal sigmoid and descending colon biopsy, pseudopolyps also seen :      MILDLY ACTIVE CHRONIC COLITIS.      NEGATIVE FOR DYSPLASIA.       3. Surgical [P], colon, rectum and distal sigmoid, pseudopolyps also  seen :      COLONIC MUCOSA WITH BENIGN LYMPHOID POLYPS (INFLAMMATORY PSEUDOPOLYPS).      NEGATIVE FOR ACTIVE INFLAMMATION, CHRONIC CHANGES OR GRANULOMAS.      NEGATIVE FOR DYSPLASIA.  No Known Allergies Current Meds  Medication Sig   albuterol  (VENTOLIN  HFA) 108 (90 Base) MCG/ACT inhaler TAKE 2 PUFFS BY MOUTH EVERY 6 HOURS AS NEEDED FOR WHEEZE OR SHORTNESS OF BREATH (Patient taking  differently: 2 puffs as needed. TAKE 2 PUFFS BY MOUTH EVERY 6 HOURS AS NEEDED FOR WHEEZE OR SHORTNESS OF BREATH)   amLODipine -olmesartan  (AZOR ) 5-20 MG tablet TAKE 1 TABLET BY MOUTH EVERY DAY   Cholecalciferol  (VITAMIN D ) 50 MCG (2000 UT) CAPS Take 1 capsule (2,000 Units total) by mouth daily.   Cyanocobalamin (VITAMIN B12 PO) Take 2 tablets by mouth daily. Take 2 gummies a day   fenofibrate  (TRICOR ) 145 MG tablet Take 1 tablet (145 mg total) by mouth daily.   HUMIRA , 2 SYRINGE, 40 MG/0.4ML prefilled syringe INJECT 40 MG (0.4 ML) UNDER THE SKIN EVERY 14 DAYS, TO START AFTER STARTER KIT   mesalamine  (CANASA ) 1000 MG suppository Place 1 suppository (1,000 mg total) rectally at bedtime.   Past Medical History:  Diagnosis Date   Allergy    SEASONAL   Anemia    Arthralgia    Asthma    controlled   Breast cancer, left breast (HCC) 05/2017   left breast cancer   Chronic headache    many years   COVID-19 virus infection 06/2020   Family history of premature coronary artery disease    father   Former smoker    10 pack year history   GERD (gastroesophageal reflux disease)    mild, occasional   H/O bone density study 11/2015   normal   H/O cardiovascular stress test 2009   History of radiation therapy 08/12/17- 09/08/17   Left Breast 40.05 Gy in 15 fractions, Left Breast Boost/ 10 Gy in 5 fractions.    Hx of adenomatous and sessile serrated colonic polyps 05/24/2014   Iron deficiency 11/29/2015   Obesity    Pectus excavatum    mild, left asymmetric   Personal history of radiation therapy    S/P hysterectomy    due to fibroids   Seasonal allergic rhinitis    UC (ulcerative colitis) (HCC) 2015   Dr. Avram   Urinary tract infection 2013   UTI due to extended-spectrum beta lactamase (ESBL) producing Escherichia coli 09/20/2017   Vitamin D  deficiency 07/18/2014   Wears glasses    Past Surgical History:  Procedure Laterality Date   BREAST LUMPECTOMY Left 06/2017   BREAST LUMPECTOMY WITH  RADIOACTIVE SEED AND SENTINEL LYMPH NODE BIOPSY Left 06/29/2017   Procedure: LEFT BREAST LUMPECTOMY WITH RADIOACTIVE SEED AND LEFT AXILLARY SENTINEL LYMPH NODE BIOPSY ERAS PATHWAY;  Surgeon: Ebbie Cough, MD;  Location: [facility];  Service: General;  Laterality: Left;   COLONOSCOPY  02/2016   polyps; Dr. Lupita Avram   EVACUATION BREAST HEMATOMA Left 07/11/2017   Procedure: EVACUATION HEMATOMA BREAST;  Surgeon: Debby Hila, MD;  Location: [facility];  Service: General;  Laterality: Left;   FOOT TENDON SURGERY Left 2013   NAVEL REMOVED     AS A CHILD   OTHER SURGICAL HISTORY     biopsy for possible sarcoidosis   POLYPECTOMY     SKIN BIOPSY     TONSILLECTOMY     TOTAL ABDOMINAL HYSTERECTOMY  2002   total; Dr. Charlie Croak   WISDOM  TOOTH EXTRACTION     Social History   Social History Narrative   Lives with her son, has 2 adult children, not married,    Retired from the [employer]   Exercise 6 days per week.     Goes [facility] for [facility]   10/2023   family history includes Alzheimer's disease in her mother; Aneurysm in her mother; Asthma in her father and sister; Atrial fibrillation in her nephew and sister; Heart disease in her mother; Heart disease (age of onset: 71) in her father; Hypertension in her mother and sister.   Review of Systems As above  Objective:   Physical Exam @BP  108/78   Pulse 79   Ht 5' 10 (1.778 m)   Wt 231 lb 6 oz (105 kg)   BMI 33.20 kg/m @  General:  NAD Eyes:   anicteric Lungs:  clear Heart::  S1S2 no rubs, murmurs or gallops Abdomen:  soft and nontender, BS+ Ext:   no edema, cyanosis or clubbing  Patti [place], CMA present.  Rectal exam Anoderm inspection - small tags, hemorrhoids Digital exam revealed normal resting tone, non-tender and no mass. Mucoid dark brown stool    Anoscopy - RP, LL interna hemorrhoids, some mucoid dc in rectum suspect proctitis    Data Reviewed:  See HPI

## 2024-01-28 LAB — CALPROTECTIN, FECAL: Calprotectin, Fecal: 149 ug/g — ABNORMAL HIGH (ref 0–120)

## 2024-02-01 ENCOUNTER — Other Ambulatory Visit

## 2024-02-01 DIAGNOSIS — K51511 Left sided colitis with rectal bleeding: Secondary | ICD-10-CM

## 2024-02-01 DIAGNOSIS — Z7960 Long term (current) use of unspecified immunomodulators and immunosuppressants: Secondary | ICD-10-CM

## 2024-02-10 LAB — ADALIMUMAB+AB (SERIAL MONITOR)
Adalimumab Drug Level: 1.3 ug/mL
Anti-Adalimumab Antibody: 41 ng/mL

## 2024-02-10 LAB — SERIAL MONITORING

## 2024-02-11 ENCOUNTER — Ambulatory Visit: Payer: Self-pay | Admitting: Internal Medicine

## 2024-02-11 ENCOUNTER — Telehealth: Payer: Self-pay | Admitting: Internal Medicine

## 2024-02-11 ENCOUNTER — Other Ambulatory Visit: Payer: Self-pay | Admitting: Internal Medicine

## 2024-02-11 DIAGNOSIS — K5150 Left sided colitis without complications: Secondary | ICD-10-CM

## 2024-02-11 DIAGNOSIS — K51511 Left sided colitis with rectal bleeding: Secondary | ICD-10-CM

## 2024-02-11 DIAGNOSIS — Z7960 Long term (current) use of unspecified immunomodulators and immunosuppressants: Secondary | ICD-10-CM

## 2024-02-11 NOTE — Telephone Encounter (Signed)
 Trough level of adalimumab  is low and there is a low level of antibodies  She is improved with Canasa  suppositories  We discussed options of changing treatment and have decided to pursue adding 6-MP 50 mg daily.  She needs to come for LFTs, CBC earlier this month was okay.  I need to recheck LFTs as a baseline before we start this.  Once I see those results would anticipate starting with the 6-MP.  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

## 2024-02-13 ENCOUNTER — Other Ambulatory Visit: Payer: Self-pay | Admitting: Internal Medicine

## 2024-03-01 ENCOUNTER — Ambulatory Visit (INDEPENDENT_AMBULATORY_CARE_PROVIDER_SITE_OTHER): Admitting: Medical

## 2024-03-01 VITALS — BP 110/68 | HR 80 | Temp 97.9°F | Wt 230.6 lb

## 2024-03-01 DIAGNOSIS — J988 Other specified respiratory disorders: Secondary | ICD-10-CM | POA: Diagnosis not present

## 2024-03-01 DIAGNOSIS — R059 Cough, unspecified: Secondary | ICD-10-CM

## 2024-03-01 DIAGNOSIS — J4541 Moderate persistent asthma with (acute) exacerbation: Secondary | ICD-10-CM

## 2024-03-01 LAB — POC COVID19 BINAXNOW: SARS Coronavirus 2 Ag: NEGATIVE

## 2024-03-01 LAB — POCT INFLUENZA A/B
Influenza A, POC: NEGATIVE
Influenza B, POC: NEGATIVE

## 2024-03-01 MED ORDER — METHYLPREDNISOLONE ACETATE 80 MG/ML IJ SUSP
80.0000 mg | Freq: Once | INTRAMUSCULAR | Status: AC
  Administered 2024-03-01: 80 mg via INTRAMUSCULAR

## 2024-03-01 MED ORDER — AZITHROMYCIN 250 MG PO TABS: ORAL_TABLET | ORAL | 0 refills | Status: AC

## 2024-03-01 MED ORDER — HYDROCODONE BIT-HOMATROP MBR 5-1.5 MG/5ML PO SOLN: 5.0000 mL | Freq: Three times a day (TID) | ORAL | 0 refills | Status: AC | PRN

## 2024-03-01 NOTE — Progress Notes (Signed)
 Subjective:  Katie Robinson is a 68 y.o. female who presents for Chief Complaint  Patient presents with   Acute Visit    Had grandson last week that was sick, woke up last Thursday with sore throat, chest congestion and now has a cough.      Katie Robinson is a 68 year old female who presents with a persistent cough and respiratory symptoms.  Her symptoms began six days ago after exposure to her sick grandson. Initially, she developed a sore throat, which worsened slightly the following day. By the third day, she felt better and attended a ball game, but subsequently developed a persistent 'barking' cough productive of clear sputum.  She experiences head and sinus pressure, frequent nasal congestion requiring her to blow her nose, and a fever that peaked at 100F. No body aches, chills, nausea, vomiting, or diarrhea. She feels short of breath and wheezy.  Her current medications include Nyquil, a generic version of Robitussin, and her inhaler, which she uses frequently but finds ineffective. She has not been taking her Xyzal  allergy pill recently, although she has it at home.  She has a history of using Humira , which she last took on Monday 3 days ago, and she has previously used breathing treatments.   No other aggravating or relieving factors.    No other c/o.  Past Medical History:  Diagnosis Date   Allergy    SEASONAL   Anemia    Arthralgia    Asthma    controlled   Breast cancer, left breast (HCC) 05/2017   left breast cancer   Chronic headache    many years   COVID-19 virus infection 06/2020   Family history of premature coronary artery disease    father   Former smoker    10 pack year history   GERD (gastroesophageal reflux disease)    mild, occasional   H/O bone density study 11/2015   normal   H/O cardiovascular stress test 2009   History of radiation therapy 08/12/17- 09/08/17   Left Breast 40.05 Gy in 15 fractions, Left Breast Boost/ 10 Gy in 5 fractions.    Hx  of adenomatous and sessile serrated colonic polyps 05/24/2014   Iron deficiency 11/29/2015   Obesity    Pectus excavatum    mild, left asymmetric   Personal history of radiation therapy    S/P hysterectomy    due to fibroids   Seasonal allergic rhinitis    UC (ulcerative colitis) (HCC) 2015   Dr. Avram   Urinary tract infection 2013   UTI due to extended-spectrum beta lactamase (ESBL) producing Escherichia coli 09/20/2017   Vitamin D  deficiency 07/18/2014   Wears glasses    Current Outpatient Medications on File Prior to Visit  Medication Sig Dispense Refill   albuterol  (VENTOLIN  HFA) 108 (90 Base) MCG/ACT inhaler TAKE 2 PUFFS BY MOUTH EVERY 6 HOURS AS NEEDED FOR WHEEZE OR SHORTNESS OF BREATH 54 each 0   amLODipine -olmesartan  (AZOR ) 5-20 MG tablet TAKE 1 TABLET BY MOUTH EVERY DAY 90 tablet 1   Ascorbic Acid (VITAMIN C) 100 MG tablet Take 100 mg by mouth daily.     Cholecalciferol  (VITAMIN D ) 50 MCG (2000 UT) CAPS Take 1 capsule (2,000 Units total) by mouth daily. 90 capsule 3   Cyanocobalamin (VITAMIN B12 PO) Take 2 tablets by mouth daily. Take 2 gummies a day     fenofibrate  (TRICOR ) 145 MG tablet Take 1 tablet (145 mg total) by mouth daily. 90 tablet 1  HUMIRA , 2 SYRINGE, 40 MG/0.4ML prefilled syringe INJECT 40 MG (0.4 ML) UNDER THE SKIN EVERY 14 DAYS, TO START AFTER STARTER KIT 2 each 6   levocetirizine (XYZAL ) 5 MG tablet Take 1 tablet (5 mg total) by mouth every evening. Post nasal drip. 30 tablet 3   mesalamine  (CANASA ) 1000 MG suppository PLACE 1 SUPPOSITORY (1,000 MG TOTAL) RECTALLY AT BEDTIME. 90 suppository 1   Multiple Vitamins-Iron (MULTIVITAMINS WITH IRON) TABS tablet Take 1 tablet by mouth daily.     No current facility-administered medications on file prior to visit.     The following portions of the patient's history were reviewed and updated as appropriate: allergies, current medications, past family history, past medical history, past social history, past surgical  history and problem list.  ROS Otherwise as in subjective above  Objective: BP 110/68   Pulse 80   Temp 97.9 F (36.6 C)   Wt 230 lb 9.6 oz (104.6 kg)   SpO2 95%   BMI 33.09 kg/m   General appearance: alert, no distress, well developed, well nourished HEENT: normocephalic, sclerae anicteric, conjunctiva pink and moist, TMs with air fluid levels, nares with turbinated edema, mucoid discharge, +erythema, pharynx with erythema and postnasal drainage Oral cavity: MMM, no lesions Neck: supple, no lymphadenopathy, no thyromegaly, no masses Heart: RRR, normal S1, S2, no murmurs Lungs: Scattered wheezes, coarse rhonchi, no rales Pulses: 2+ radial pulses, 2+ pedal pulses, normal cap refill Ext: no edema   Assessment: Encounter Diagnoses  Name Primary?   Cough, unspecified type Yes   Moderate persistent asthma with exacerbation    Respiratory tract infection      Plan: Acute upper respiratory infection Symptoms persist for six days with sore throat, productive cough, shortness of breath, and wheezing. Negative COVID and flu tests.  - Prescribe Z-Pak (azithromycin ). - Prescribe Hycodan cough syrup for as needed use.  Caution with drowsiness. - Administer breathing treatment in office today, albuterol  nebulized x 1. - Advise use of albuterol  rescue inhaler 3-4 times daily as needed. - Depo-Medrol  80 mg IM given for inflammation and wheezing.  She had done well on this prior for similar -Begin back on your xyzal  for a week.  You can also use OTC Mucinex for mucous.   If not improving in the next 48 hours, then call or recheck.   Uilani was seen today for acute visit.  Diagnoses and all orders for this visit:  Cough, unspecified type -     POC COVID-19 -     POCT Influenza A/B -     methylPREDNISolone  acetate (DEPO-MEDROL ) injection 80 mg  Moderate persistent asthma with exacerbation -     methylPREDNISolone  acetate (DEPO-MEDROL ) injection 80 mg  Respiratory tract  infection  Other orders -     azithromycin  (ZITHROMAX ) 250 MG tablet; 2 tablets day 1, then 1 tablet days 2-4 -     HYDROcodone  bit-homatropine (HYCODAN) 5-1.5 MG/5ML syrup; Take 5 mLs by mouth every 8 (eight) hours as needed for cough.    Follow up: prn

## 2024-03-02 ENCOUNTER — Other Ambulatory Visit: Payer: Self-pay | Admitting: Medical

## 2024-03-03 ENCOUNTER — Ambulatory Visit: Admitting: Internal Medicine

## 2024-03-24 ENCOUNTER — Other Ambulatory Visit: Payer: Self-pay | Admitting: Internal Medicine

## 2024-03-24 DIAGNOSIS — Z7960 Long term (current) use of unspecified immunomodulators and immunosuppressants: Secondary | ICD-10-CM

## 2024-03-24 DIAGNOSIS — K51511 Left sided colitis with rectal bleeding: Secondary | ICD-10-CM

## 2024-03-24 NOTE — Telephone Encounter (Signed)
 Dr Avram I can't see a note regarding adding 6 mp after reviewing LFT. Is this ok to refill?

## 2024-03-26 NOTE — Telephone Encounter (Signed)
 There is an August phone note that indicates she was supposed to get CBC and LFT's I ordered those - please ask her to come do those and also find out how she is doing

## 2024-03-27 NOTE — Telephone Encounter (Signed)
No answer and voicemail full.

## 2024-03-28 NOTE — Telephone Encounter (Signed)
Mailbox full will mail letter

## 2024-05-02 ENCOUNTER — Ambulatory Visit: Admitting: Allergy & Immunology

## 2024-05-02 ENCOUNTER — Encounter: Payer: Self-pay | Admitting: Allergy & Immunology

## 2024-05-02 VITALS — BP 124/68 | HR 80 | Ht 72.0 in | Wt 232.0 lb

## 2024-05-02 DIAGNOSIS — J4530 Mild persistent asthma, uncomplicated: Secondary | ICD-10-CM | POA: Diagnosis not present

## 2024-05-02 DIAGNOSIS — J029 Acute pharyngitis, unspecified: Secondary | ICD-10-CM

## 2024-05-02 DIAGNOSIS — J310 Chronic rhinitis: Secondary | ICD-10-CM

## 2024-05-02 MED ORDER — ARNUITY ELLIPTA 100 MCG/ACT IN AEPB: 1.0000 | INHALATION_SPRAY | Freq: Every day | RESPIRATORY_TRACT | 5 refills | Status: AC

## 2024-05-02 NOTE — Patient Instructions (Addendum)
 1. Chronic rhinitis - Because of insurance stipulations, we cannot do skin testing on the same day as your first visit. - We are all working to fight this, but for now we need to do two separate visits.  - We will know more after we do testing at the next visit.  - The skin testing visit can be squeezed in at your convenience.  - Then we can make a more full plan to address all of your symptoms. - Be sure to stop your antihistamines for 3 days before this appointment, including the levocetirizine.   2. Mild persistent asthma, uncomplicated - Lung testing looks great today, but since you are needing so much albuterol , I would like to start a daily controller medication to see if this helps. - We are going to start Arnuity one puff once daily (this SEEMS to be covered by your insurance, but let us  know if this is too expensive). - Daily controller medication(s): Arnuity 100mcg one puff once daily - Prior to physical activity: albuterol  2 puffs 10-15 minutes before physical activity. - Rescue medications: albuterol  4 puffs every 4-6 hours as needed - Changes during respiratory infections or worsening symptoms: Increase Arnuity to 1 puff twice daily for TWO WEEKS. - Asthma control goals:  * Full participation in all desired activities (may need albuterol  before activity) * Albuterol  use two time or less a week on average (not counting use with activity) * Cough interfering with sleep two time or less a month * Oral steroids no more than once a year * No hospitalizations  3. Return in about 1 week (around 05/09/2024) for ALLERGY TESTING (1-55). You can have the follow up appointment with Dr. Iva or a Nurse Practicioner (our Nurse Practitioners are excellent and always have Physician oversight!).    Please inform us  of any Emergency Department visits, hospitalizations, or changes in symptoms. Call us  before going to the ED for breathing or allergy symptoms since we might be able to fit you  in for a sick visit. Feel free to contact us  anytime with any questions, problems, or concerns.  It was a pleasure to meet you today!  Websites that have reliable patient information: 1. American Academy of Asthma, Allergy, and Immunology: www.aaaai.org 2. Food Allergy Research and Education (FARE): foodallergy.org 3. Mothers of Asthmatics: http://www.asthmacommunitynetwork.org 4. American College of Allergy, Asthma, and Immunology: www.acaai.org      "Like" us  on Facebook and Instagram for our latest updates!      A healthy democracy works best when [employer] participate! Make sure you are registered to vote! If you have moved or changed any of your contact information, you will need to get this updated before voting! Scan the QR codes below to learn more!

## 2024-05-02 NOTE — Progress Notes (Signed)
 NEW PATIENT  Date of Service/Encounter:  05/02/24  Consult requested by: Katie Alm RAMAN, PA-C   Assessment:   Chronic rhinitis - planning for skin testing at the next visit  Sore throat  Mild persistent asthma, uncomplicated - adding Arnuity  Former smoker [phone])  Plan/Recommendations:   1. Chronic rhinitis - Because of insurance stipulations, we cannot do skin testing on the same day as your first visit. - We are all working to fight this, but for now we need to do two separate visits.  - We will know more after we do testing at the next visit.  - The skin testing visit can be squeezed in at your convenience.  - Then we can make a more full plan to address all of your symptoms. - Be sure to stop your antihistamines for 3 days before this appointment, including the levocetirizine.   2. Mild persistent asthma, uncomplicated - Lung testing looks great today, but since you are needing so much albuterol , I would like to start a daily controller medication to see if this helps. - We are going to start Arnuity one puff once daily (this SEEMS to be covered by your insurance, but let us  know if this is too expensive). - Daily controller medication(s): Arnuity 100mcg one puff once daily - Prior to physical activity: albuterol  2 puffs 10-15 minutes before physical activity. - Rescue medications: albuterol  4 puffs every 4-6 hours as needed - Changes during respiratory infections or worsening symptoms: Increase Arnuity to 1 puff twice daily for TWO WEEKS. - Asthma control goals:  * Full participation in all desired activities (may need albuterol  before activity) * Albuterol  use two time or less a week on average (not counting use with activity) * Cough interfering with sleep two time or less a month * Oral steroids no more than once a year * No hospitalizations  3. Return in about 1 week (around 05/09/2024) for ALLERGY TESTING (1-55). You can have the follow up appointment  with Dr. Iva or a Nurse Practicioner (our Nurse Practitioners are excellent and always have Physician oversight!).    This note in its entirety was forwarded to the Provider who requested this consultation.  Subjective:   Katie Robinson is a 68 y.o. female presenting today for evaluation of  Chief Complaint  Patient presents with   New Patient (Initial Visit)    throat/allergies    Katie Robinson has a history of the following: Patient Active Problem List   Diagnosis Date Noted   Allergic rhinitis 11/03/2023   Impaired fasting blood sugar 11/03/2023   Cystitis 07/09/2023   Moderate persistent asthma with exacerbation 07/09/2023   Screening for heart disease 11/02/2022   Screening for diabetes mellitus 11/02/2022   Iron deficiency 11/02/2022   Obesity    Hyperparathyroidism, primary 05/28/2020   Chronic nonintractable headache 05/15/2020   Encounter for health maintenance examination in adult 05/15/2020   Moderate persistent asthma without complication 08/22/2018   Hypercalcemia 08/18/2018   Essential hypertension 06/24/2018   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   History of breast cancer 05/11/2018   Vaccine counseling 05/11/2018   Microscopic hematuria 05/11/2018   UTI due to extended-spectrum beta lactamase (ESBL) producing Escherichia coli 09/20/2017   Long-term use of immunosuppressant medication - Humira  09/16/2017   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  Discussed the use of AI scribe software for clinical  note transcription with the patient and/or guardian, who gave verbal consent to proceed.  Katie Robinson was referred by Katie Alm RAMAN, PA-C.     Katie Robinson is a 68 y.o. female presenting for an evaluation of chronic postnasal drip and throat clearing  She has experienced a long-standing issue with throat irritation, describing it as feeling like 'something is attacking my throat.' This sensation has been present for years, occurring intermittently, and is often accompanied by mucus production and a persistent cough. Suppressing the cough often leads to further discomfort. Despite a previous evaluation by an ENT seven years ago, which did not reveal any abnormalities, her symptoms have persisted.  In August or September, she experienced a brief illness after exposure to her sick grandson, which exacerbated her cough. She sought treatment from her primary care doctor, who administered a prednisone  shot, prescribed antibiotics, and provided cough medicine, which she discontinued due to ineffectiveness. She also tried cetirizine  without relief. Her cough is often productive, and she frequently uses cough drops to manage symptoms.  She was diagnosed with ulcerative colitis in 2015 and currently manages it with Humira  injections every two weeks. She previously used another medication that was effective for a time but eventually stopped working.   Asthma/Respiratory Symptom History: She has a history of asthma, diagnosed at birth, and reports using albuterol  more frequently than usual, approximately once a day, though not every day. She previously used Flovent, which was beneficial, but has not been on a daily asthma medication recently. Her father also had asthma.  Allergic Rhinitis Symptom History: She has not been allergy tested before and has been on Xyzal  (levocetirizine) but is unsure of its effectiveness. 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  Her compliance is difficult to evaluate.  Otherwise, there is no history of other atopic diseases, including drug allergies, stinging insect allergies, or contact dermatitis. There is no significant infectious history. Vaccinations are up to date.    Past Medical History: Patient Active Problem List   Diagnosis Date Noted   Allergic rhinitis 11/03/2023   Impaired fasting blood sugar 11/03/2023   Cystitis 07/09/2023   Moderate persistent asthma with exacerbation 07/09/2023   Screening for heart disease 11/02/2022   Screening for diabetes mellitus 11/02/2022   Iron deficiency 11/02/2022   Obesity    Hyperparathyroidism, primary 05/28/2020   Chronic nonintractable headache 05/15/2020   Encounter for health maintenance examination in adult 05/15/2020   Moderate persistent asthma without complication 08/22/2018   Hypercalcemia 08/18/2018   Essential hypertension 06/24/2018   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   History of breast cancer 05/11/2018   Vaccine counseling 05/11/2018   Microscopic hematuria 05/11/2018   UTI due to extended-spectrum beta lactamase (ESBL) producing Escherichia coli 09/20/2017   Long-term use of immunosuppressant medication - Humira  09/16/2017   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 If you have any questions, ask your nurse or doctor.          STOP taking these  medications    HUMIRA  PEN Ali Molina       TAKE these medications    Adalimumab -aaty (2 Syringe) 40 MG/0.4ML Pskt INJECT 40 MG (0.4 ML) UNDER THE SKIN EVERY 14 DAYS   albuterol  108 (90 Base) MCG/ACT inhaler Commonly known as: VENTOLIN  HFA TAKE 2 PUFFS BY MOUTH EVERY 6 HOURS AS NEEDED FOR WHEEZE OR SHORTNESS OF BREATH   amLODipine -olmesartan  5-20 MG tablet Commonly known as: AZOR  TAKE 1 TABLET BY MOUTH EVERY DAY   Arnuity Ellipta 100 MCG/ACT Aepb Generic drug: Fluticasone Furoate Inhale 1 puff into the lungs daily.   azithromycin  250 MG tablet Commonly known as: ZITHROMAX  2 tablets day 1, then 1 tablet days 2-4   fenofibrate  145 MG tablet Commonly known as: Tricor  Take 1 tablet (145 mg total) by mouth daily.   HYDROcodone  bit-homatropine 5-1.5 MG/5ML syrup Commonly known as: HYCODAN Take 5 mLs by mouth every 8 (eight) hours as needed for cough.   levocetirizine 5 MG tablet Commonly known as: XYZAL  Take 1 tablet (5 mg total) by mouth every evening. Post nasal drip.   mesalamine  1000 MG suppository Commonly known as: CANASA  PLACE 1 SUPPOSITORY (1,000 MG TOTAL) RECTALLY AT BEDTIME.   multivitamins with iron Tabs tablet Take 1 tablet by mouth daily.   VITAMIN B12 PO Take 2 tablets by mouth daily. Take 2 gummies a day   vitamin C 100 MG tablet Take 100 mg by mouth daily.   Vitamin D  50 MCG (2000 UT) Caps Take 1 capsule (2,000 Units total) by mouth daily.        Birth History: non-contributory  Developmental History: non-contributory  Past Surgical History: Past Surgical History:  Procedure Laterality Date   BREAST LUMPECTOMY Left 06/2017   BREAST LUMPECTOMY WITH RADIOACTIVE SEED AND SENTINEL LYMPH NODE BIOPSY Left 06/29/2017   Procedure: LEFT BREAST LUMPECTOMY WITH RADIOACTIVE SEED AND LEFT AXILLARY SENTINEL LYMPH NODE BIOPSY ERAS PATHWAY;  Surgeon: Ebbie Cough, MD;  Location: [facility];  Service: General;  Laterality: Left;   COLONOSCOPY   02/2016   polyps; Dr. Lupita Commander   EVACUATION BREAST HEMATOMA Left 07/11/2017   Procedure: EVACUATION HEMATOMA BREAST;  Surgeon: Katie Hila, MD;  Location: [facility];  Service: General;  Laterality: Left;   FOOT TENDON SURGERY Left 2012-05-30   NAVEL REMOVED     AS A CHILD   OTHER SURGICAL HISTORY     biopsy for possible sarcoidosis   POLYPECTOMY     SKIN BIOPSY     TONSILLECTOMY     TOTAL ABDOMINAL HYSTERECTOMY  May 30, 2001   total; Dr. Charlie Croak   WISDOM TOOTH EXTRACTION       Family History: Family History  Problem Relation Age of Onset   Hypertension Mother    Aneurysm Mother        brain   Alzheimer's disease Mother    Heart disease Mother    Asthma Father    Heart disease Father 52       died of MI mid May 30, 2024  Hypertension Sister    Asthma Sister    Atrial fibrillation Sister    Atrial fibrillation Nephew    Cancer Neg Hx    Stroke Neg Hx    Diabetes Neg Hx    Colon cancer Neg Hx    Colon polyps Neg Hx    Crohn's disease Neg Hx    Ulcerative colitis Neg Hx    Hypercalcemia Neg Hx      Social History: Roselina lives at home with her husband.  She lives in a house that is 83 years old.  There were area rugs over laminate flooring in the main living areas and carpeting in the bedroom.  She has gas heating and central cooling.  There are no dust mite covers in the bedding.  There is no tobacco exposure.  There is no fume, chemical, or dust exposure.  She does not [place] near an interstate or industrial area.  She was a smoker from 62 through 1989.  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  She retired in January 2024.   Review of systems otherwise negative other than that mentioned in the HPI.    Objective:   Blood pressure 124/68, pulse 80, height 6' (1.829 m), weight 232 lb (105.2 kg), SpO2 98%. Body mass index is 31.46 kg/m.     Physical Exam Vitals reviewed.  Constitutional:      Appearance: She is well-developed.     Comments: Throat  clearing during the entire visit.  HENT:     Head: Normocephalic and atraumatic.     Right Ear: Tympanic membrane, ear canal and external ear normal. No drainage, swelling or tenderness. Tympanic membrane is not injected, scarred, erythematous, retracted or bulging.     Left Ear: Tympanic membrane, ear canal and external ear normal. No drainage, swelling or tenderness. Tympanic membrane is not injected, scarred, erythematous, retracted or bulging.     Nose: No nasal deformity, septal deviation, mucosal edema or rhinorrhea.     Right Turbinates: Enlarged, swollen and pale.     Left Turbinates: Enlarged, swollen and pale.     Right Sinus: No maxillary sinus tenderness or frontal sinus tenderness.     Left Sinus: No maxillary sinus tenderness or frontal sinus tenderness.     Mouth/Throat:     Mouth: Mucous membranes are moist. Mucous membranes are pale and not dry.     Pharynx: Uvula midline.     Tonsils: No tonsillar exudate or tonsillar abscesses.     Comments: Marked postnasal drip. Eyes:     General:        Right eye: No discharge.        Left eye: No discharge.     Conjunctiva/sclera: Conjunctivae normal.     Right eye: Right conjunctiva is not injected. No chemosis.    Left eye: Left conjunctiva is not injected. No chemosis.    Pupils: Pupils are equal, round, and reactive to light.  Cardiovascular:     Rate and Rhythm: Normal rate and regular rhythm.     Heart sounds: Normal heart sounds.  Pulmonary:     Effort: Pulmonary effort is normal. No tachypnea, accessory muscle usage or respiratory distress.     Breath sounds: Normal breath sounds. No stridor, decreased air movement or transmitted upper airway sounds. No decreased breath sounds, wheezing, rhonchi or rales.     Comments: Intermittent coughing. Chest:     Chest wall: No tenderness.  Abdominal:     Tenderness: There  is no abdominal tenderness. There is no guarding or rebound.  Lymphadenopathy:     Head:     Right side of  head: No submandibular, tonsillar or occipital adenopathy.     Left side of head: No submandibular, tonsillar or occipital adenopathy.     Cervical: No cervical adenopathy.  Skin:    Coloration: Skin is not pale.     Findings: No abrasion, erythema, petechiae or rash. Rash is not papular, urticarial or vesicular.  Neurological:     Mental Status: She is alert.  Psychiatric:        Behavior: Behavior is cooperative.      Diagnostic studies:    Spirometry: results normal (FEV1: 2.17/87%, FVC: 3.56/110%, FEV1/FVC: 61%).    Spirometry consistent with normal pattern.   Allergy Studies: deferred due to insurance stipulations that require a separate visit for testing         Marty Shaggy, MD [facility] 

## 2024-05-07 ENCOUNTER — Other Ambulatory Visit: Payer: Self-pay | Admitting: Medical

## 2024-05-23 ENCOUNTER — Encounter: Payer: Self-pay | Admitting: Allergy & Immunology

## 2024-05-23 ENCOUNTER — Other Ambulatory Visit: Payer: Self-pay

## 2024-05-23 ENCOUNTER — Ambulatory Visit: Admitting: Allergy & Immunology

## 2024-05-23 ENCOUNTER — Other Ambulatory Visit (HOSPITAL_COMMUNITY): Payer: Self-pay

## 2024-05-23 DIAGNOSIS — J3089 Other allergic rhinitis: Secondary | ICD-10-CM | POA: Diagnosis not present

## 2024-05-23 DIAGNOSIS — J302 Other seasonal allergic rhinitis: Secondary | ICD-10-CM

## 2024-05-23 MED ORDER — CARBINOXAMINE MALEATE 4 MG PO TABS: 4.0000 mg | ORAL_TABLET | Freq: Two times a day (BID) | ORAL | 5 refills | Status: AC

## 2024-05-23 MED ORDER — MONTELUKAST SODIUM 10 MG PO TABS: 10.0000 mg | ORAL_TABLET | Freq: Every day | ORAL | 1 refills | Status: AC

## 2024-05-23 NOTE — Patient Instructions (Addendum)
 1. Chronic rhinitis - Testing today showed: grasses, ragweed, weeds, trees, dust mites, and cockroach - Copy of test results provided.  - Avoidance measures provided. - Stop taking: levocetirizine - Start taking: Carbinoxamine  4mg  tablet 1-2 times daily as needed and Singulair  (montelukast ) 10mg  daily - You can use an extra dose of the antihistamine, if needed, for breakthrough symptoms.  - Consider nasal saline rinses 1-2 times daily to remove allergens from the nasal cavities as well as help with mucous clearance (this is especially helpful to do before the nasal sprays are given) - Consider allergy shots as a means of long-term control. - Allergy shots re-train and reset the immune system to ignore environmental allergens and decrease the resulting immune response to those allergens (sneezing, itchy watery eyes, runny nose, nasal congestion, etc).    - Allergy shots improve symptoms in 75-85% of patients.  - We can discuss more at the next appointment if the medications are not working for you.  2. Mild persistent asthma, uncomplicated - Lung testing looked good last time. - Continue with the Arnuity daily.  - Daily controller medication(s): Arnuity 100mcg one puff once daily - Prior to physical activity: albuterol  2 puffs 10-15 minutes before physical activity. - Rescue medications: albuterol  4 puffs every 4-6 hours as needed - Changes during respiratory infections or worsening symptoms: Increase Arnuity to 1 puff twice daily for TWO WEEKS. - Asthma control goals:  * Full participation in all desired activities (may need albuterol  before activity) * Albuterol  use two time or less a week on average (not counting use with activity) * Cough interfering with sleep two time or less a month * Oral steroids no more than once a year * No hospitalizations  3. Return in about 3 months (around 08/21/2024). You can have the follow up appointment with Dr. Iva or a Nurse Practicioner (our  Nurse Practitioners are excellent and always have Physician oversight!).    Please inform us  of any Emergency Department visits, hospitalizations, or changes in symptoms. Call us  before going to the ED for breathing or allergy symptoms since we might be able to fit you in for a sick visit. Feel free to contact us  anytime with any questions, problems, or concerns.  It was a pleasure to see you again today!  Websites that have reliable patient information: 1. American Academy of Asthma, Allergy, and Immunology: www.aaaai.org 2. Food Allergy Research and Education (FARE): foodallergy.org 3. Mothers of Asthmatics: http://www.asthmacommunitynetwork.org 4. American College of Allergy, Asthma, and Immunology: www.acaai.org      "Like" us  on Facebook and Instagram for our latest updates!      A healthy democracy works best when [employer] participate! Make sure you are registered to vote! If you have moved or changed any of your contact information, you will need to get this updated before voting! Scan the QR codes below to learn more!        Airborne Adult Perc - 05/23/24 1300     Time Antigen Placed 1337    Allergen Manufacturer Jestine    Location Back    Number of Test 55    1. Control-Buffer 50% Glycerol Negative    2. Control-Histamine 2+    3. Bahia 2+    4. Bermuda Negative    5. Johnson Negative    6. [place]  Blue Negative    7. Meadow Fescue Negative    8. Perennial Rye Negative    9. Timothy 2+    10. Ragweed Mix 3+  11. Cocklebur 3+    12. Plantain,  English Negative    13. Baccharis Negative    14. Dog Fennel Negative    15. Russian Thistle Negative    16. Lamb's Quarters Negative    17. Sheep Sorrell 2+    18. Rough Pigweed Negative    19. Marsh Elder, Rough Negative    20. Mugwort, Common Negative    21. Box, Elder 2+    22. Cedar, red Negative    23. Sweet Gum 2+    24. Pecan Pollen Negative    25. Pine Mix Negative    26. Walnut, Black Pollen  Negative    27. Red Mulberry Negative    28. Ash Mix Negative    29. Birch Mix 3+    30. Beech American Negative    31. Cottonwood, Eastern Negative    32. Hickory, White Negative    33. Maple Mix Negative    34. Oak, Eastern Mix 3+    35. Sycamore Eastern Negative    36. Alternaria Alternata Negative    37. Cladosporium Herbarum Negative    38. Aspergillus Mix Negative    39. Penicillium Mix Negative    40. Bipolaris Sorokiniana (Helminthosporium) Negative    41. Drechslera Spicifera (Curvularia) Negative    42. Mucor Plumbeus Negative    43. Fusarium Moniliforme Negative    44. Aureobasidium Pullulans (pullulara) Negative    45. Rhizopus Oryzae Negative    46. Botrytis Cinera Negative    47. Epicoccum Nigrum Negative    48. Phoma Betae Negative    49. Dust Mite Mix 4+    50. Cat Hair 10,000 BAU/ml Negative    51.  Dog Epithelia Negative    52. Mixed Feathers Negative    53. Horse Epithelia Negative    54. Cockroach, German Negative    55. Tobacco Leaf Negative          Intradermal - 05/23/24 1356     Time Antigen Placed 1400    Allergen Manufacturer Greer    Location Arm    Number of Test 10    Control Negative    Bermuda Negative    Johnson Negative    Mold 1 Negative    Mold 2 Negative    Mold 3 Negative    Mold 4 Negative    Cat Negative    Dog Negative    Cockroach 1+          Reducing Pollen Exposure  The American Academy of Allergy, Asthma and Immunology suggests the following steps to reduce your exposure to pollen during allergy seasons.    Do not hang sheets or clothing out to dry; pollen may collect on these items. Do not mow lawns or spend time around freshly cut grass; mowing stirs up pollen. Keep windows closed at night.  Keep car windows closed while driving. Minimize morning activities outdoors, a time when pollen counts are usually at their highest. Stay indoors as much as possible when pollen counts or humidity is high and on windy  days when pollen tends to remain in the air longer. Use air conditioning when possible.  Many air conditioners have filters that trap the pollen spores. Use a HEPA room air filter to remove pollen form the indoor air you breathe.  Control of Dust Mite Allergen    Dust mites play a major role in allergic asthma and rhinitis.  They occur in environments with high humidity wherever human skin is found.  Dust mites absorb humidity from the atmosphere (ie, they do not drink) and feed on organic matter (including shed human and animal skin).  Dust mites are a microscopic type of insect that you cannot see with the naked eye.  High levels of dust mites have been detected from mattresses, pillows, carpets, upholstered furniture, bed covers, clothes, soft toys and any woven material.  The principal allergen of the dust mite is found in its feces.  A gram of dust may contain 1,000 mites and 250,000 fecal particles.  Mite antigen is easily measured in the air during house cleaning activities.  Dust mites do not bite and do not cause harm to humans, other than by triggering allergies/asthma.    Ways to decrease your exposure to dust mites in your home:  Encase mattresses, box springs and pillows with a mite-impermeable barrier or cover   Wash sheets, blankets and drapes weekly in hot water (130 F) with detergent and dry them in a dryer on the hot setting.  Have the room cleaned frequently with a vacuum cleaner and a damp dust-mop.  For carpeting or rugs, vacuuming with a vacuum cleaner equipped with a high-efficiency particulate air (HEPA) filter.  The dust mite allergic individual should not be in a room which is being cleaned and should wait 1 hour after cleaning before going into the room. Do not sleep on upholstered furniture (eg, couches).   If possible removing carpeting, upholstered furniture and drapery from the home is ideal.  Horizontal blinds should be eliminated in the rooms where the person spends  the most time (bedroom, study, television room).  Washable vinyl, roller-type shades are optimal. Remove all non-washable stuffed toys from the bedroom.  Wash stuffed toys weekly like sheets and blankets above.   Reduce indoor humidity to less than 50%.  Inexpensive humidity monitors can be purchased at most hardware stores.  Do not use a humidifier as can make the problem worse and are not recommended.  Control of Cockroach Allergen  Cockroach allergen has been identified as an important cause of acute attacks of asthma, especially in urban settings.  There are fifty-five species of cockroach that exist in the [place] , however only three, the American, German and Oriental species produce allergen that can affect patients with Asthma.  Allergens can be obtained from fecal particles, egg casings and secretions from cockroaches.    Remove food sources. Reduce access to water. Seal access and entry points. Spray runways with 0.5-1% Diazinon or Chlorpyrifos Blow boric acid power under stoves and refrigerator. Place bait stations (hydramethylnon) at feeding sites.  Allergy Shots  Allergies are the result of a chain reaction that starts in the immune system. Your immune system controls how your body defends itself. For instance, if you have an allergy to pollen, your immune system identifies pollen as an invader or allergen. Your immune system overreacts by producing antibodies called Immunoglobulin E (IgE). These antibodies travel to cells that release chemicals, causing an allergic reaction.  The concept behind allergy immunotherapy, whether it is received in the form of shots or tablets, is that the immune system can be desensitized to specific allergens that trigger allergy symptoms. Although it requires time and patience, the payback can be long-term relief. Allergy injections contain a dilute solution of those substances that you are allergic to based upon your skin testing and allergy  history.   How Do Allergy Shots Work?  Allergy shots work much like a vaccine. Your body responds to injected amounts  of a particular allergen given in increasing doses, eventually developing a resistance and tolerance to it. Allergy shots can lead to decreased, minimal or no allergy symptoms.  There generally are two phases: build-up and maintenance. Build-up often ranges from three to six months and involves receiving injections with increasing amounts of the allergens. The shots are typically given once or twice a week, though more rapid build-up schedules are sometimes used.  The maintenance phase begins when the most effective dose is reached. This dose is different for each person, depending on how allergic you are and your response to the build-up injections. Once the maintenance dose is reached, there are longer periods between injections, typically two to four weeks.  Occasionally doctors give cortisone-type shots that can temporarily reduce allergy symptoms. These types of shots are different and should not be confused with allergy immunotherapy shots.  Who Can Be Treated with Allergy Shots?  Allergy shots may be a good treatment approach for people with allergic rhinitis (hay fever), allergic asthma, conjunctivitis (eye allergy) or stinging insect allergy.   Before deciding to begin allergy shots, you should consider:   The length of allergy season and the severity of your symptoms  Whether medications and/or changes to your environment can control your symptoms  Your desire to avoid long-term medication use  Time: allergy immunotherapy requires a major time commitment  Cost: may vary depending on your insurance coverage  Allergy shots for children age 18 and older are effective and often well tolerated. They might prevent the onset of new allergen sensitivities or the progression to asthma.  Allergy shots are not started on patients who are pregnant but can be continued on  patients who become pregnant while receiving them. In some patients with other medical conditions or who take certain common medications, allergy shots may be of risk. It is important to mention other medications you talk to your allergist.   What are the two types of build-ups offered:   RUSH or Rapid Desensitization -- one day of injections lasting from 8:30-4:30pm, injections every 1 hour.  Approximately half of the build-up process is completed in that one day.  The following week, normal build-up is resumed, and this entails ~16 visits either weekly or twice weekly, until reaching your "maintenance dose" which is continued weekly until eventually getting spaced out to every month for a duration of 3 to 5 years. The regular build-up appointments are nurse visits where the injections are administered, followed by required monitoring for 30 minutes.    Traditional build-up -- weekly visits for 6 -12 months until reaching "maintenance dose", then continue weekly until eventually spacing out to every 4 weeks as above. At these appointments, the injections are administered, followed by required monitoring for 30 minutes.     Either way is acceptable, and both are equally effective. With the rush protocol, the advantage is that less time is spent here for injections overall AND you would also reach maintenance dosing faster (which is when the clinical benefit starts to become more apparent). Not everyone is a candidate for rapid desensitization.   IF we proceed with the RUSH protocol, there are premedications which must be taken the day before and the day after the rush only (this includes antihistamines, steroids, and Singulair ).  After the rush day, no prednisone  or Singulair  is required, and we just recommend antihistamines taken on your injection day.  What Is An Estimate of the Costs?  If you are interested in starting allergy injections, please  check with your insurance company about your  coverage for both allergy vial sets and allergy injections.  Please do so prior to making the appointment to start injections.  The following are CPT codes to give to your insurance company. These are the amounts we BILL to the insurance company, but the amount YOU WILL PAY and WE RECEIVE IS SUBSTANTIALLY LESS and depends on the contracts we have with different insurance companies.   Amount Billed to Insurance One allergy vial set  CPT 95165   $ 1200     Two allergy vial set  CPT 95165   $ 2400     Three allergy vial set  CPT 95165   $ 3600     One injection   CPT 95115   $ 35  Two injections   CPT 95117   $ 40 RUSH (Rapid Desensitization) CPT 95180 x 8 hours $500/hour  Regarding the allergy injections, your co-pay may or may not apply with each injection, so please confirm this with your insurance company. When you start allergy injections, 1 or 2 sets of vials are made based on your allergies.  Not all patients can be on one set of vials. A set of vials lasts 6 months to a year depending on how quickly you can proceed with your build-up of your allergy injections. Vials are personalized for each patient depending on their specific allergens.  How often are allergy injection given during the build-up period?   Injections are given at least weekly during the build-up period until your maintenance dose is achieved. Per the doctor's discretion, you may have the option of getting allergy injections two times per week during the build-up period. However, there must be at least 48 hours between injections. The build-up period is usually completed within 6-12 months depending on your ability to schedule injections and for adjustments for reactions. When maintenance dose is reached, your injection schedule is gradually changed to every two weeks and later to every three weeks. Injections will then continue every 4 weeks. Usually, injections are continued for a total of 3-5 years.   When Will I Feel  Better?  Some may experience decreased allergy symptoms during the build-up phase. For others, it may take as long as 12 months on the maintenance dose. If there is no improvement after a year of maintenance, your allergist will discuss other treatment options with you.  If you aren't responding to allergy shots, it may be because there is not enough dose of the allergen in your vaccine or there are missing allergens that were not identified during your allergy testing. Other reasons could be that there are high levels of the allergen in your environment or major exposure to non-allergic triggers like tobacco smoke.  What Is the Length of Treatment?  Once the maintenance dose is reached, allergy shots are generally continued for three to five years. The decision to stop should be discussed with your allergist at that time. Some people may experience a permanent reduction of allergy symptoms. Others may relapse and a longer course of allergy shots can be considered.  What Are the Possible Reactions?  The two types of adverse reactions that can occur with allergy shots are local and systemic. Common local reactions include very mild redness and swelling at the injection site, which can happen immediately or several hours after. Report a delayed reaction from your last injection. These include arm swelling or runny nose, watery eyes or cough that occurs within 12-24 hours  after injection. A systemic reaction, which is less common, affects the entire body or a particular body system. They are usually mild and typically respond quickly to medications. Signs include increased allergy symptoms such as sneezing, a stuffy nose or hives.   Rarely, a serious systemic reaction called anaphylaxis can develop. Symptoms include swelling in the throat, wheezing, a feeling of tightness in the chest, nausea or dizziness. Most serious systemic reactions develop within 30 minutes of allergy shots. This is why it is  strongly recommended you wait in your doctor's office for 30 minutes after your injections. Your allergist is trained to watch for reactions, and his or her staff is trained and equipped with the proper medications to identify and treat them.   Report to the nurse immediately if you experience any of the following symptoms: swelling, itching or redness of the skin, hives, watery eyes/nose, breathing difficulty, excessive sneezing, coughing, stomach pain, diarrhea, or light headedness. These symptoms may occur within 15-20 minutes after injection and may require medication.   Who Should Administer Allergy Shots?  The preferred location for receiving shots is your prescribing allergist's office. Injections can sometimes be given at another facility where the physician and staff are trained to recognize and treat reactions, and have received instructions by your prescribing allergist.  What if I am late for an injection?   Injection dose will be adjusted depending upon how many days or weeks you are late for your injection.   What if I am sick?   Please report any illness to the nurse before receiving injections. She may adjust your dose or postpone injections depending on your symptoms. If you have fever, flu, sinus infection or chest congestion it is best to postpone allergy injections until you are better. Never get an allergy injection if your asthma is causing you problems. If your symptoms persist, seek out medical care to get your health problem under control.  What If I am or Become Pregnant:  Women that become pregnant should schedule an appointment with [facility] before receiving any further allergy injections. `

## 2024-05-23 NOTE — Progress Notes (Unsigned)
 FOLLOW UP  Date of Service/Encounter:  05/23/24   Assessment:   Chronic rhinitis - planning for skin testing at the next visit   Sore throat   Mild persistent asthma, uncomplicated - adding Arnuity   Former smoker [phone])    Plan/Recommendations:   There are no Patient Instructions on file for this visit.   Subjective:   Katie Robinson is a 68 y.o. female presenting today for follow up of  Chief Complaint  Patient presents with   Allergy Testing    Katie Robinson has a history of the following: Patient Active Problem List   Diagnosis Date Noted   Allergic rhinitis 11/03/2023   Impaired fasting blood sugar 11/03/2023   Cystitis 07/09/2023   Moderate persistent asthma with exacerbation 07/09/2023   Screening for heart disease 11/02/2022   Screening for diabetes mellitus 11/02/2022   Iron deficiency 11/02/2022   Obesity    Hyperparathyroidism, primary 05/28/2020   Chronic nonintractable headache 05/15/2020   Encounter for health maintenance examination in adult 05/15/2020   Moderate persistent asthma without complication 08/22/2018   Hypercalcemia 08/18/2018   Essential hypertension 06/24/2018   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   History of breast cancer 05/11/2018   Vaccine counseling 05/11/2018   Microscopic hematuria 05/11/2018   UTI due to extended-spectrum beta lactamase (ESBL) producing Escherichia coli 09/20/2017   Long-term use of immunosuppressant medication - Humira  09/16/2017   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  Discussed the use of AI scribe software for clinical note transcription with the patient and/or guardian, who gave verbal consent to proceed.  Katie Robinson is a 68 y.o. female presenting for skin testing. She was last seen on ***. We could not do testing because her insurance company does not cover testing on the same day as a New Patient visit. She has been off of all antihistamines 3 days in anticipation of the testing.   Otherwise, there have been no changes to her past medical history, surgical history, family history, or social history.    Review of systems otherwise negative other than that mentioned in the HPI.    Objective:   There were no vitals taken for this visit. There is no height or weight on file to calculate BMI.    Physical exam deferred since this was a skin testing appointment only.   Diagnostic studies: {Blank single:19197::none,deferred due to recent antihistamine use,labs sent instead, }  Spirometry: {Blank single:19197::results normal (FEV1: ***%, FVC: ***%, FEV1/FVC: ***%),results abnormal (FEV1: ***%, FVC: ***%, FEV1/FVC: ***%)}.    {Blank single:19197::Spirometry consistent with mild obstructive disease,Spirometry consistent with moderate obstructive disease,Spirometry consistent with severe obstructive disease,Spirometry consistent with possible restrictive disease,Spirometry consistent with mixed obstructive and restrictive disease,Spirometry uninterpretable due to technique,Spirometry consistent with normal pattern}. {Blank single:19197::Albuterol /Atrovent nebulizer,Xopenex/Atrovent nebulizer,Albuterol  nebulizer,Albuterol  four puffs via MDI,Xopenex four puffs via MDI} treatment given in clinic with {Blank single:19197::significant improvement in FEV1 per ATS criteria,significant improvement in FVC per ATS criteria,significant improvement in FEV1 and FVC  per ATS criteria,improvement in FEV1, but not significant per ATS criteria,improvement in  FVC, but not significant per ATS criteria,improvement in FEV1 and FVC, but not significant per ATS criteria,no improvement}.  Allergy Studies: {Blank single:19197::none,labs sent instead, }    {Blank single:19197::Allergy testing results were read and interpreted by myself, documented by clinical staff., }      Marty Shaggy, MD  [facility] 

## 2024-05-24 ENCOUNTER — Encounter: Payer: Self-pay | Admitting: Allergy & Immunology

## 2024-05-25 ENCOUNTER — Other Ambulatory Visit (HOSPITAL_COMMUNITY): Payer: Self-pay

## 2024-06-01 ENCOUNTER — Ambulatory Visit

## 2024-06-01 VITALS — BP 124/68 | Ht 72.0 in | Wt 235.0 lb

## 2024-06-01 DIAGNOSIS — Z0000 Encounter for general adult medical examination without abnormal findings: Secondary | ICD-10-CM

## 2024-06-01 NOTE — Patient Instructions (Signed)
 Ms. Hafen,  Thank you for taking the time for your Medicare Wellness Visit. I appreciate your continued commitment to your health goals. Please review the care plan we discussed, and feel free to reach out if I can assist you further.  Please note that Annual Wellness Visits do not include a physical exam. Some assessments may be limited, especially if the visit was conducted virtually. If needed, we may recommend an in-person follow-up with your provider.  Ongoing Care Seeing your primary care provider every 3 to 6 months helps us  monitor your health and provide consistent, personalized care.   Referrals If a referral was made during today's visit and you haven't received any updates within two weeks, please contact the referred provider directly to check on the status.  Recommended Screenings:  Health Maintenance  Topic Date Due   Medicare Annual Wellness Visit  Never done   COVID-19 Vaccine (5 - 2025-26 season) 02/14/2024   DTaP/Tdap/Td vaccine (2 - Td or Tdap) 08/22/2024*   Flu Shot  09/12/2024*   Breast Cancer Screening  10/28/2024   Colon Cancer Screening  08/20/2026   Pneumococcal Vaccine for age over 49  Completed   Osteoporosis screening with Bone Density Scan  Completed   Hepatitis C Screening  Completed   Zoster (Shingles) Vaccine  Completed   Meningitis B Vaccine  Aged Out  *Topic was postponed. The date shown is not the original due date.       06/01/2024    1:21 PM  Advanced Directives  Does Patient Have a Medical Advance Directive? No  Would patient like information on creating a medical advance directive? No - Patient declined    Vision: Annual vision screenings are recommended for early detection of glaucoma, cataracts, and diabetic retinopathy. These exams can also reveal signs of chronic conditions such as diabetes and high blood pressure.  Dental: Annual dental screenings help detect early signs of oral cancer, gum disease, and other conditions linked to  overall health, including heart disease and diabetes.  Please see the attached documents for additional preventive care recommendations.

## 2024-06-01 NOTE — Progress Notes (Unsigned)
 I connected with  Katie Robinson on 06/01/2024 by a video and audio enabled telemedicine application and verified that I am speaking with the correct person using two identifiers.  Patient Location: Home  Provider Location: Home Office  Persons Participating in Visit: Patient.  I discussed the limitations of evaluation and management by telemedicine. The patient expressed understanding and agreed to proceed.  Vital Signs: Because this visit was a virtual/telehealth visit, some criteria may be missing or patient reported. Any vitals not documented were not able to be obtained and vitals that have been documented are patient reported.  Chief Complaint  Patient presents with   Medicare Wellness     Subjective:   Katie Robinson is a 68 y.o. female who presents for a Medicare Annual Wellness Visit.  Visit info / Clinical Intake: Medicare Wellness Visit Type:: Subsequent Annual Wellness Visit Persons participating in visit and providing information:: patient Medicare Wellness Visit Mode:: Video Since this visit was completed virtually, some vitals may be partially provided or unavailable. Missing vitals are due to the limitations of the virtual format.: Documented vitals are patient reported If Telephone or Video please confirm:: I connected with patient using audio/video enable telemedicine. I verified patient identity with two identifiers, discussed telehealth limitations, and patient agreed to proceed. Patient Location:: home Provider Location:: home office Interpreter Needed?: No Pre-visit prep was completed: yes AWV questionnaire completed by patient prior to visit?: no Living arrangements:: with family/others Patient's Overall Health Status Rating: very good Typical amount of pain: some Does pain affect daily life?: no Are you currently prescribed opioids?: no  Dietary Habits and Nutritional Risks How many meals a day?: 2 Eats fruit and vegetables daily?: yes Most meals are  obtained by: eating out; preparing own meals In the last 2 weeks, have you had any of the following?: none Diabetic:: no  Functional Status Activities of Daily Living (to include ambulation/medication): Independent Ambulation: Independent Medication Administration: Independent Home Management (perform basic housework or laundry): Independent Manage your own finances?: yes Primary transportation is: driving Concerns about vision?: no *vision screening is required for WTM* Concerns about hearing?: no  Fall Screening Falls in the past year?: 0 Number of falls in past year: 0 Was there an injury with Fall?: 0 Fall Risk Category Calculator: 0 Patient Fall Risk Level: Low Fall Risk  Fall Risk Patient at Risk for Falls Due to: No Fall Risks Fall risk Follow up: Falls evaluation completed; Falls prevention discussed  Home and Transportation Safety: All rugs have non-skid backing?: N/A, no rugs All stairs or steps have railings?: N/A, no stairs Grab bars in the bathtub or shower?: yes Have non-skid surface in bathtub or shower?: yes Good home lighting?: yes Regular seat belt use?: yes Hospital stays in the last year:: no  Cognitive Assessment Difficulty concentrating, remembering, or making decisions? : no Will 6CIT or Mini Cog be Completed: yes What year is it?: 0 points What month is it?: 0 points Give patient an address phrase to remember (5 components): remember words apple , table ,penny About what time is it?: 0 points Count backwards from 20 to 1: 0 points Say the months of the year in reverse: 0 points Repeat the address phrase from earlier: 0 points 6 CIT Score: 0 points  Advance Directives (For Healthcare) Does Patient Have a Medical Advance Directive?: No Would patient like information on creating a medical advance directive?: No - Patient declined  Reviewed/Updated  Reviewed/Updated: Reviewed All (Medical, Surgical, Family, Medications, Allergies, Care Teams,  Patient Goals)    Allergies (verified) Dust mite extract   Current Medications (verified) Outpatient Encounter Medications as of 06/01/2024  Medication Sig   Adalimumab -aaty, 2 Syringe, 40 MG/0.4ML PSKT INJECT 40 MG (0.4 ML) UNDER THE SKIN EVERY 14 DAYS   albuterol  (VENTOLIN  HFA) 108 (90 Base) MCG/ACT inhaler TAKE 2 PUFFS BY MOUTH EVERY 6 HOURS AS NEEDED FOR WHEEZE OR SHORTNESS OF BREATH   amLODipine -olmesartan  (AZOR ) 5-20 MG tablet TAKE 1 TABLET BY MOUTH EVERY DAY   Carbinoxamine  Maleate 4 MG TABS Take 1 tablet (4 mg total) by mouth in the morning and at bedtime.   Cholecalciferol  (VITAMIN D ) 50 MCG (2000 UT) CAPS Take 1 capsule (2,000 Units total) by mouth daily.   Cyanocobalamin (VITAMIN B12 PO) Take 2 tablets by mouth daily. Take 2 gummies a day   fenofibrate  (TRICOR ) 145 MG tablet TAKE 1 TABLET BY MOUTH EVERY DAY   Fluticasone Furoate  (ARNUITY ELLIPTA ) 100 MCG/ACT AEPB Inhale 1 puff into the lungs daily.   montelukast  (SINGULAIR ) 10 MG tablet Take 1 tablet (10 mg total) by mouth at bedtime.   Ascorbic Acid (VITAMIN C) 100 MG tablet Take 100 mg by mouth daily. (Patient not taking: Reported on 06/01/2024)   azithromycin  (ZITHROMAX ) 250 MG tablet 2 tablets day 1, then 1 tablet days 2-4 (Patient not taking: Reported on 06/01/2024)   HYDROcodone  bit-homatropine (HYCODAN) 5-1.5 MG/5ML syrup Take 5 mLs by mouth every 8 (eight) hours as needed for cough. (Patient not taking: Reported on 06/01/2024)   mesalamine  (CANASA ) 1000 MG suppository PLACE 1 SUPPOSITORY (1,000 MG TOTAL) RECTALLY AT BEDTIME. (Patient not taking: Reported on 06/01/2024)   Multiple Vitamins-Iron (MULTIVITAMINS WITH IRON) TABS tablet Take 1 tablet by mouth daily. (Patient not taking: Reported on 06/01/2024)   No facility-administered encounter medications on file as of 06/01/2024.    History: Past Medical History:  Diagnosis Date   Allergy    SEASONAL   Anemia    Arthralgia    Asthma    controlled   Breast cancer,  left breast (HCC) 05/2017   left breast cancer   Chronic headache    many years   COVID-19 virus infection 06/2020   Family history of premature coronary artery disease    father   Former smoker    10 pack year history   GERD (gastroesophageal reflux disease)    mild, occasional   H/O bone density study 11/2015   normal   H/O cardiovascular stress test 2009   History of radiation therapy 08/12/17- 09/08/17   Left Breast 40.05 Gy in 15 fractions, Left Breast Boost/ 10 Gy in 5 fractions.    Hx of adenomatous and sessile serrated colonic polyps 05/24/2014   Iron deficiency 11/29/2015   Obesity    Pectus excavatum    mild, left asymmetric   Personal history of radiation therapy    S/P hysterectomy    due to fibroids   Seasonal allergic rhinitis    UC (ulcerative colitis) (HCC) 2015   Dr. Avram   Urinary tract infection 2013   UTI due to extended-spectrum beta lactamase (ESBL) producing Escherichia coli 09/20/2017   Vitamin D  deficiency 07/18/2014   Wears glasses    Past Surgical History:  Procedure Laterality Date   BREAST LUMPECTOMY Left 06/2017   BREAST LUMPECTOMY WITH RADIOACTIVE SEED AND SENTINEL LYMPH NODE BIOPSY Left 06/29/2017   Procedure: LEFT BREAST LUMPECTOMY WITH RADIOACTIVE SEED AND LEFT AXILLARY SENTINEL LYMPH NODE BIOPSY ERAS PATHWAY;  Surgeon: Ebbie Cough, MD;  Location: [facility]  [place];  Service: General;  Laterality: Left;   COLONOSCOPY  02/2016   polyps; Dr. Lupita Commander   EVACUATION BREAST HEMATOMA Left 07/11/2017   Procedure: EVACUATION HEMATOMA BREAST;  Surgeon: Debby Hila, MD;  Location: [facility];  Service: General;  Laterality: Left;   FOOT TENDON SURGERY Left 06-27-2012   NAVEL REMOVED     AS A CHILD   OTHER SURGICAL HISTORY     biopsy for possible sarcoidosis   POLYPECTOMY     SKIN BIOPSY     TONSILLECTOMY     TOTAL ABDOMINAL HYSTERECTOMY  06/27/01   total; Dr. Charlie Croak   WISDOM TOOTH EXTRACTION     Family History  Problem Relation  Age of Onset   Hypertension Mother    Aneurysm Mother        brain   Alzheimer's disease Mother    Heart disease Mother    Asthma Father    Heart disease Father 30       died of MI mid 06-27-2024   Hypertension Sister    Asthma Sister    Atrial fibrillation Sister    Atrial fibrillation Nephew    Cancer Neg Hx    Stroke Neg Hx    Diabetes Neg Hx    Colon cancer Neg Hx    Colon polyps Neg Hx    Crohn's disease Neg Hx    Ulcerative colitis Neg Hx    Hypercalcemia Neg Hx    Social History   Occupational History   Occupation: retired Nurse, [employer] SERV[employer]  Tobacco Use   Smoking status: Former    Current packs/day: 0.00    Types: Cigarettes    Quit date: 06/15/1988    Years since quitting: 35.9   Smokeless tobacco: Never  Vaping Use   Vaping status: Never Used  Substance and Sexual Activity   Alcohol use: Yes    Alcohol/week: 0.0 standard drinks of alcohol    Comment: rarely   Drug use: No   Sexual activity: Not on file   Tobacco Counseling Counseling given: Not Answered  SDOH Screenings   Food Insecurity: No Food Insecurity (06/01/2024)  Housing: Low Risk (06/01/2024)  Transportation Needs: No Transportation Needs (06/01/2024)  Utilities: Not At Risk (06/01/2024)  Alcohol Screen: Low Risk (08/22/2023)  Depression (PHQ2-9): Low Risk (06/01/2024)  Financial Resource Strain: Low Risk (08/22/2023)  Physical Activity: Sufficiently Active (06/01/2024)  Social Connections: Moderately Integrated (06/01/2024)  Stress: No Stress Concern Present (06/01/2024)  Tobacco Use: Medium Risk (06/01/2024)  Health Literacy: Adequate Health Literacy (06/01/2024)   See flowsheets for full screening details  Depression Screen PHQ 2 & 9 Depression Scale- Over the past 2 weeks, how often have you been bothered by any of the following problems? Little interest or pleasure in doing things: 0 Feeling down, depressed, or hopeless (PHQ Adolescent also includes...irritable): 0 PHQ-2 Total Score:  0 Trouble falling or staying asleep, or sleeping too much: 0 Feeling tired or having little energy: 0 Poor appetite or overeating (PHQ Adolescent also includes...weight loss): 1 Feeling bad about yourself - or that you are a failure or have let yourself or your family down: 0 Trouble concentrating on things, such as reading the newspaper or watching television (PHQ Adolescent also includes...like school work): 0 Moving or speaking so slowly that other people could have noticed. Or the opposite - being so fidgety or restless that you have been moving around a lot more than usual: 0 Thoughts that you would be better off dead, or  of hurting yourself in some way: 0 PHQ-9 Total Score: 1 If you checked off any problems, how difficult have these problems made it for you to do your work, take care of things at home, or get along with other people?: Not difficult at all  Depression Treatment Depression Interventions/Treatment : EYV7-0 Score <4 Follow-up Not Indicated     Goals Addressed               This Visit's Progress     Patient Stated (pt-stated)        Patient would like to read the bible more              Objective:    Today's Vitals   06/01/24 1318  BP: 124/68  Weight: 235 lb (106.6 kg)  Height: 6' (1.829 m)   Body mass index is 31.87 kg/m.  Hearing/Vision screen Hearing Screening - Comments:: No difficulties  Vision Screening - Comments:: Patient wears glasses . Sees Maude Bring for eyes Immunizations and Health Maintenance Health Maintenance  Topic Date Due   Medicare Annual Wellness (AWV)  Never done   COVID-19 Vaccine (5 - 2025-26 season) 02/14/2024   DTaP/Tdap/Td (2 - Td or Tdap) 08/22/2024 (Originally 05/31/2023)   Influenza Vaccine  09/12/2024 (Originally 01/14/2024)   Mammogram  10/28/2024   Colonoscopy  08/20/2026   Pneumococcal Vaccine: 50+ Years  Completed   Bone Density Scan  Completed   Hepatitis C Screening  Completed   Zoster Vaccines- Shingrix    Completed   Meningococcal B Vaccine  Aged Out        Assessment/Plan:  This is a routine wellness examination for Nate.  Patient Care Team: Tysinger, Alm RAMAN, PA-C as PCP - General (Family Medicine) Tobb, Kardie, DO as PCP - Cardiology (Cardiology) Avram Lupita BRAVO, MD as Consulting Physician (Gastroenterology) Loretha Ash, MD as Consulting Physician (Hematology and Oncology)  I have personally reviewed and noted the following in the patients chart:   Medical and social history Use of alcohol, tobacco or illicit drugs  Current medications and supplements including opioid prescriptions. Functional ability and status Nutritional status Physical activity Advanced directives List of other physicians Hospitalizations, surgeries, and ER visits in previous 12 months Vitals Screenings to include cognitive, depression, and falls Referrals and appointments  No orders of the defined types were placed in this encounter.  In addition, I have reviewed and discussed with patient certain preventive protocols, quality metrics, and best practice recommendations. A written personalized care plan for preventive services as well as general preventive health recommendations were provided to patient.   Lyle MARLA Right, [place]   06/01/2024   No follow-ups on file.  After Visit Summary: (MyChart) Due to this being a telephonic visit, the after visit summary with patients personalized plan was offered to patient via MyChart   Nurse Notes: ***

## 2024-06-23 ENCOUNTER — Telehealth: Payer: Self-pay | Admitting: Internal Medicine

## 2024-06-23 NOTE — Telephone Encounter (Signed)
 Inbound call from cover my meds and they are wanting to know if we are still pursuing the prior Auth for the medication called Adalimumab -aaty. A good call back number will be [phone]. Please advise.

## 2024-06-27 ENCOUNTER — Other Ambulatory Visit (HOSPITAL_COMMUNITY): Payer: Self-pay

## 2024-06-27 NOTE — Telephone Encounter (Signed)
 At the current moment, a test claim does not show a need for a prior authorization. Only that the medication is too soon to fill again until 07-11-2024. Will take note and test again 1-28 to see if authorization is required.

## 2024-07-21 ENCOUNTER — Other Ambulatory Visit (HOSPITAL_COMMUNITY): Payer: Self-pay

## 2024-08-22 ENCOUNTER — Ambulatory Visit: Admitting: Allergy & Immunology

## 2024-11-07 ENCOUNTER — Encounter: Payer: Self-pay | Admitting: Medical
